# Patient Record
Sex: Male | Born: 1960 | Race: Black or African American | Hispanic: No | Marital: Single | Smoking: Current every day smoker
Health system: Southern US, Community
[De-identification: ages and names within clinical notes are randomized; demographics above are authoritative.]

## PROBLEM LIST (undated history)

## (undated) DIAGNOSIS — Z8739 Personal history of other diseases of the musculoskeletal system and connective tissue: Secondary | ICD-10-CM

## (undated) DIAGNOSIS — K219 Gastro-esophageal reflux disease without esophagitis: Secondary | ICD-10-CM

## (undated) DIAGNOSIS — G8929 Other chronic pain: Secondary | ICD-10-CM

## (undated) DIAGNOSIS — J302 Other seasonal allergic rhinitis: Secondary | ICD-10-CM

## (undated) DIAGNOSIS — M779 Enthesopathy, unspecified: Secondary | ICD-10-CM

## (undated) DIAGNOSIS — M1990 Unspecified osteoarthritis, unspecified site: Secondary | ICD-10-CM

## (undated) DIAGNOSIS — Z8489 Family history of other specified conditions: Secondary | ICD-10-CM

## (undated) HISTORY — PX: CYSTECTOMY: SUR359

## (undated) HISTORY — PX: TONSILLECTOMY: SUR1361

## (undated) HISTORY — PX: FRACTURE SURGERY: SHX138

---

## 2007-07-13 ENCOUNTER — Emergency Department (HOSPITAL_COMMUNITY): Admission: EM | Admit: 2007-07-13 | Discharge: 2007-07-13 | Payer: Self-pay | Admitting: Emergency Medicine

## 2009-10-22 ENCOUNTER — Emergency Department (HOSPITAL_COMMUNITY): Admission: EM | Admit: 2009-10-22 | Discharge: 2009-10-22 | Payer: Self-pay | Admitting: Emergency Medicine

## 2009-12-08 ENCOUNTER — Emergency Department (HOSPITAL_COMMUNITY): Admission: EM | Admit: 2009-12-08 | Discharge: 2009-12-09 | Payer: Self-pay | Admitting: Emergency Medicine

## 2009-12-15 ENCOUNTER — Ambulatory Visit: Payer: Self-pay | Admitting: Vascular Surgery

## 2009-12-15 ENCOUNTER — Encounter (INDEPENDENT_AMBULATORY_CARE_PROVIDER_SITE_OTHER): Payer: Self-pay | Admitting: Emergency Medicine

## 2009-12-15 ENCOUNTER — Observation Stay (HOSPITAL_COMMUNITY): Admission: EM | Admit: 2009-12-15 | Discharge: 2009-12-15 | Payer: Self-pay | Admitting: Emergency Medicine

## 2009-12-26 ENCOUNTER — Emergency Department (HOSPITAL_COMMUNITY): Admission: EM | Admit: 2009-12-26 | Discharge: 2009-12-26 | Payer: Self-pay | Admitting: Emergency Medicine

## 2010-04-29 ENCOUNTER — Ambulatory Visit (HOSPITAL_COMMUNITY): Admission: RE | Admit: 2010-04-29 | Discharge: 2010-04-29 | Payer: Self-pay | Admitting: Orthopaedic Surgery

## 2010-10-31 LAB — COMPREHENSIVE METABOLIC PANEL
ALT: 19 U/L (ref 0–53)
AST: 26 U/L (ref 0–37)
Albumin: 3.9 g/dL (ref 3.5–5.2)
Alkaline Phosphatase: 45 U/L (ref 39–117)
BUN: 16 mg/dL (ref 6–23)
CO2: 26 mEq/L (ref 19–32)
Creatinine, Ser: 1.33 mg/dL (ref 0.4–1.5)
GFR calc non Af Amer: 57 mL/min — ABNORMAL LOW (ref >60)
Potassium: 3.9 mEq/L (ref 3.5–5.1)
Sodium: 138 mEq/L (ref 135–145)
Total Bilirubin: 0.4 mg/dL (ref 0.3–1.2)

## 2010-10-31 LAB — CBC
Hemoglobin: 12.6 g/dL — ABNORMAL LOW (ref 13.0–17.0)
MCV: 102.6 fL — ABNORMAL HIGH (ref 78.0–100.0)
Platelets: 192 10*3/uL (ref 150–400)
WBC: 8.1 10*3/uL (ref 4.0–10.5)

## 2010-10-31 LAB — POCT I-STAT, CHEM 8
BUN: 15 mg/dL (ref 6–23)
Calcium, Ion: 1.07 mmol/L — ABNORMAL LOW (ref 1.12–1.32)
Creatinine, Ser: 1.5 mg/dL (ref 0.4–1.5)
HCT: 38 % — ABNORMAL LOW (ref 39.0–52.0)
Hemoglobin: 12.9 g/dL — ABNORMAL LOW (ref 13.0–17.0)
Potassium: 3.9 mEq/L (ref 3.5–5.1)
Sodium: 140 mEq/L (ref 135–145)
TCO2: 22 mmol/L (ref 0–100)

## 2010-10-31 LAB — APTT: aPTT: 25 seconds (ref 24–37)

## 2010-10-31 LAB — URINE MICROSCOPIC-ADD ON

## 2010-10-31 LAB — TYPE AND SCREEN

## 2010-10-31 LAB — PROTIME-INR: INR: 1.06 (ref 0.00–1.49)

## 2010-12-25 ENCOUNTER — Emergency Department (HOSPITAL_COMMUNITY): Payer: Self-pay

## 2010-12-25 ENCOUNTER — Emergency Department (HOSPITAL_COMMUNITY)
Admission: EM | Admit: 2010-12-25 | Discharge: 2010-12-25 | Disposition: A | Discharge status: Home / Self Care | Payer: Self-pay | Attending: Emergency Medicine | Admitting: Emergency Medicine

## 2010-12-25 DIAGNOSIS — Y9355 Activity, bike riding: Secondary | ICD-10-CM | POA: Insufficient documentation

## 2010-12-25 DIAGNOSIS — M25569 Pain in unspecified knee: Secondary | ICD-10-CM | POA: Insufficient documentation

## 2010-12-25 DIAGNOSIS — S8000XA Contusion of unspecified knee, initial encounter: Secondary | ICD-10-CM | POA: Insufficient documentation

## 2010-12-26 NOTE — Op Note (Signed)
NAMESHAUL, TRAUTMAN                ACCOUNT NO.:  [id number]   MEDICAL RECORD NO.:  [id number]          PATIENT TYPE:  EMS   LOCATION:  ED                           FACILITY:  [facility]   PHYSICIAN:  Johnette Abraham, MD    DATE OF BIRTH:  Feb 28, 1961   DATE OF PROCEDURE:  DATE OF DISCHARGE:                               OPERATIVE REPORT   PREOPERATIVE DIAGNOSIS:  Abscess extensor tenosynovitis of the left long  finger.   POSTOPERATIVE DIAGNOSIS:  Abscess extensor tenosynovitis of the left  long finger.   PROCEDURES:  1. Irrigation and debridement of the deep abscess left long finger.  2. Wound culture.  3. Packing with Iodoform gauze.   SURGEON:  Johnette Abraham, MD   ANESTHESIA:  Local 2% lidocaine without epinephrine, a total of 5 mL  used as a digital hand block.   ESTIMATED BLOOD LOSS:  Minimal.   FINDINGS:  Gross purulence that was cultured from the wound. Involvement  of the extensor tendon.   INDICATIONS:  Mr. Copenhaver is a pleasant gentleman who injured his hand,  so he states, at work with some kind of drill press.  Initially, the  patient noticed a small swelling and pus underneath the skin.  Patient  at this time heated a pin and poked the lesion with some expression of  some purulence.  He states over the course of the last 3 weeks, his  finger has increasingly become red and swollen.  Now he has got swelling  on the dorsum of his hand.  He denies fever.  On examination, there was  fluctuance that extended from the PIP joint distally.  The old I&D site  by the patient was located on the radial side of the distal pulp of the  finger.  There was no tenderness on the proximal flexor tendon sheath.  Patient was counseled on the need for emergent incision and drainage to  avoid any flexor tenosynovitis or worsening of the infection.  He agreed  to proceed.  Consent was obtained.   DESCRIPTION OF PROCEDURE:  Initially, the left finger and adjacent  digits were prepped with  Betadine solution.  A local digital block was  performed in the flexor tendon sheath with 2% lidocaine without  epinephrine.  Additional lidocaine was placed over the dorsum of the  metacarpophalangeal joint area to anesthetize the dorsum of the skin on  the left long finger.  Afterwards, a cruciate type incision was made  overlying the previous I&D site by the patient.  Immediately, there was  gross purulence that was expressed.  This was cultured.  Afterwards, the  incision was lengthened.  There was a pocket that extended onto the  dorsum of the finger from the base of the nail all the way back to the  PIP joint involving most of the dorsum of the finger.  The purulence was  milked throughout the I&D site.  Afterwards, the wound was probed.  There was some deeper loculated infection in the pulp of the finger,  which was expressed.  Afterwards, the entire cavity was irrigated  out  with a syringe and irrigation catheter with normal saline solution.  Next, the wound was gently packed, more so on the pulp and the remaining  I&D site with 0.25 inch Iodoform gauze.  There was concern over the  essentially large blister-type formation on the dorsum of the finger,  which was in intimate proximity to his extensor tendon.  The patient was  cautioned of the possible likelihood of losing the skin over this area,  which may require some other coverage.  After the packing, the wound  was complete.  The wound was covered with sterile gauze and wrapped.  The patient received an IM shot of Ancef and he will be placed on double  strength Bactrim to guard against MRSA.  The patient is instructed on  wound care and he will call the office for a followup appointment.      Johnette Abraham, MD  Electronically Signed     HCC/MEDQ  D:  07/13/2007  T:  07/13/2007  Job:  [id number]

## 2011-02-27 ENCOUNTER — Emergency Department (HOSPITAL_COMMUNITY)
Admission: EM | Admit: 2011-02-27 | Discharge: 2011-02-27 | Discharge status: Against Medical Advice | Payer: Self-pay | Attending: Emergency Medicine | Admitting: Emergency Medicine

## 2011-05-22 LAB — WOUND CULTURE: Gram Stain: NONE SEEN

## 2012-10-08 ENCOUNTER — Encounter (HOSPITAL_COMMUNITY): Payer: Self-pay | Admitting: *Deleted

## 2012-10-08 ENCOUNTER — Emergency Department (HOSPITAL_COMMUNITY)
Admission: EM | Admit: 2012-10-08 | Discharge: 2012-10-08 | Disposition: A | Discharge status: Home / Self Care | Payer: Self-pay | Attending: Emergency Medicine | Admitting: Emergency Medicine

## 2012-10-08 DIAGNOSIS — G8929 Other chronic pain: Secondary | ICD-10-CM | POA: Insufficient documentation

## 2012-10-08 DIAGNOSIS — F17200 Nicotine dependence, unspecified, uncomplicated: Secondary | ICD-10-CM | POA: Insufficient documentation

## 2012-10-08 DIAGNOSIS — M25569 Pain in unspecified knee: Secondary | ICD-10-CM | POA: Insufficient documentation

## 2012-10-08 DIAGNOSIS — R269 Unspecified abnormalities of gait and mobility: Secondary | ICD-10-CM | POA: Insufficient documentation

## 2012-10-08 DIAGNOSIS — Z8739 Personal history of other diseases of the musculoskeletal system and connective tissue: Secondary | ICD-10-CM | POA: Insufficient documentation

## 2012-10-08 DIAGNOSIS — M25519 Pain in unspecified shoulder: Secondary | ICD-10-CM | POA: Insufficient documentation

## 2012-10-08 HISTORY — DX: Enthesopathy, unspecified: M77.9

## 2012-10-08 HISTORY — DX: Other chronic pain: G89.29

## 2012-10-08 MED ORDER — TRAMADOL HCL 50 MG PO TABS: 50.0000 mg | ORAL_TABLET | Freq: Four times a day (QID) | ORAL | Status: DC | PRN

## 2012-10-08 MED ORDER — OXYCODONE-ACETAMINOPHEN 5-325 MG PO TABS
2.0000 | ORAL_TABLET | Freq: Once | ORAL | Status: AC
  Administered 2012-10-08: 2 via ORAL
  Filled 2012-10-08: qty 2

## 2012-10-08 NOTE — ED Notes (Addendum)
Pt with pain to entire L side x 1 year.  However, has been increasing, thus he is here today.  States was dx with tendonitis while in prison.  Pt would also like to be tested for dm and other maladies.  Notified of his need for pcp.

## 2012-10-08 NOTE — ED Notes (Signed)
Ortho paged for knee immobilizer

## 2012-10-08 NOTE — ED Provider Notes (Signed)
History     CSN: [id number]  Arrival date & time 10/08/12  1147   First MD Initiated Contact with Patient 10/08/12 1242      No chief complaint on file.   (Consider location/radiation/quality/duration/timing/severity/associated sxs/prior treatment) HPI Comments: Pt has tendonitis in bilateral knees and elbows. Says the pain has moved up to both shoulders, more left than right. Pt states pain is 9/10. Sharp pain that is persistent. Left shoulder pain has been worsening over several months. Pt has tried stretching the muscles and it helps, but then the pain returns. Not better or worse with movement. Denies trauma, fever, chills.   Pt also complaining of chronic left knee pain and asked for knee immobilzer for comfort.  Pt takes over the counter ibuprofen for pain. Pt had muscle relaxers prescribed by Dr. Madilyn Fireman, but doesn't see that doctor anymore.   Patient is a 52 y.o. male presenting with left shoulder and left knee pain Severity: Moderate Onset quality: Gradual  Duration: months Timing: Constant  Progression: worsening Relieved by: ibuprofen, muscle relaxers Worsened by: Nothing tried  Ineffective treatments: None tried       Past Medical History  Diagnosis Date  . Tendonitis   . Chronic pain     History reviewed. No pertinent past surgical history.  No family history on file.  History  Substance Use Topics  . Smoking status: Current Every Day Smoker -- 0.25 packs/day    Types: Cigarettes  . Smokeless tobacco: Not on file  . Alcohol Use: Yes     Comment: 1 pint of liquor per week      Review of Systems  Constitutional: Negative for fever and diaphoresis.  HENT: Negative for neck pain and neck stiffness.   Eyes: Negative for visual disturbance.  Respiratory: Negative for apnea, chest tightness and shortness of breath.   Cardiovascular: Negative for chest pain and palpitations.  Gastrointestinal: Negative for nausea, vomiting, diarrhea and constipation.   Genitourinary: Negative for dysuria.  Musculoskeletal: Positive for gait problem.       Sharp pain in left shoulder. Left knee pain.  Skin: Negative for rash.  Neurological: Negative for dizziness, weakness, light-headedness, numbness and headaches.    Allergies  Review of patient's allergies indicates no known allergies.  Home Medications   Current Outpatient Rx  Name  Route  Sig  Dispense  Refill  . acetaminophen (TYLENOL) 500 MG tablet   Oral   Take 1,500 mg by mouth every 6 (six) hours as needed for pain.         . Naproxen Sodium (ALEVE PO)   Oral   Take 3 tablets by mouth daily as needed (for pain.).           BP 121/79  Pulse 55  Temp(Src) 97.9 F (36.6 C)  Resp 18  SpO2 98%  Physical Exam  Nursing note and vitals reviewed. Constitutional: He is oriented to person, place, and time. He appears well-developed and well-nourished. No distress.  HENT:  Head: Normocephalic and atraumatic.  Eyes: Conjunctivae and EOM are normal.  Neck: Normal range of motion. Neck supple.  No meningeal signs  Cardiovascular: Normal rate, regular rhythm, normal heart sounds and intact distal pulses.  Exam reveals no gallop and no friction rub.   No murmur heard. Pulmonary/Chest: Effort normal and breath sounds normal. No respiratory distress. He has no wheezes. He has no rales. He exhibits no tenderness.  Abdominal: Soft. Bowel sounds are normal. He exhibits no distension. There is no tenderness. There  is no rebound and no guarding.  Musculoskeletal: Normal range of motion. He exhibits no edema and no tenderness.  5/5 strength throughout. No swelling. No erythema. No warmth. No effusion. No deformity. No bony tenderness. Good quadricep strength on straight leg raise. No joint laxity.  Neurological: He is alert and oriented to person, place, and time. No cranial nerve deficit.  No focal deficits. Sensation to light touch intact.  Skin: Skin is warm and dry. He is not diaphoretic. No  erythema.    ED Course  Procedures (including critical care time)  Labs Reviewed - No data to display No results found.   Diagnosis: left shoulder pain, left knee pain    MDM  Considering HPI and PE, including lack of trauma, hx of chronic conditions, interventions that have worked in the past, no swelling, no erythema, no warmth, no effusion, no deformity, no bony tenderness, no fever or impaired ROM, not concerned for acute bony injury or septic arthritis.  Emphasized the importance of following up with a PCP for pt's chronic medical issues, including pain management, and use of ibuprofen to calm chronic inflammatory conditions. Provided resource list.  Treated pain in ED with percocet and provided prescription for Ultram for breakthrough pain with ibuprofen. Provided knee immobilizer as requested by patient for comfort. At this time there does not appear to be any evidence of an acute emergency medical condition and the patient appears stable for discharge with appropriate outpatient follow up.Diagnosis was discussed with patient who verbalizes understanding and is agreeable to discharge.   Glade Nurse, PA-C 10/09/12 1324

## 2012-10-08 NOTE — ED Notes (Addendum)
Pain to left ankle, knee, thigh for years due to tendonitis. Also having heart burn, with no relief from tums. Also dx with pinched nerve in back, which has been giving him more problems.

## 2012-10-09 ENCOUNTER — Emergency Department (HOSPITAL_COMMUNITY): Payer: Self-pay

## 2012-10-09 ENCOUNTER — Emergency Department (HOSPITAL_COMMUNITY)
Admission: EM | Admit: 2012-10-09 | Discharge: 2012-10-09 | Disposition: A | Discharge status: Home / Self Care | Payer: Self-pay | Attending: Emergency Medicine | Admitting: Emergency Medicine

## 2012-10-09 ENCOUNTER — Encounter (HOSPITAL_COMMUNITY): Payer: Self-pay | Admitting: Neurology

## 2012-10-09 DIAGNOSIS — Z791 Long term (current) use of non-steroidal anti-inflammatories (NSAID): Secondary | ICD-10-CM | POA: Insufficient documentation

## 2012-10-09 DIAGNOSIS — IMO0001 Reserved for inherently not codable concepts without codable children: Secondary | ICD-10-CM | POA: Insufficient documentation

## 2012-10-09 DIAGNOSIS — Z79899 Other long term (current) drug therapy: Secondary | ICD-10-CM | POA: Insufficient documentation

## 2012-10-09 DIAGNOSIS — M1990 Unspecified osteoarthritis, unspecified site: Secondary | ICD-10-CM | POA: Insufficient documentation

## 2012-10-09 DIAGNOSIS — M7918 Myalgia, other site: Secondary | ICD-10-CM

## 2012-10-09 DIAGNOSIS — G8929 Other chronic pain: Secondary | ICD-10-CM | POA: Insufficient documentation

## 2012-10-09 DIAGNOSIS — M25569 Pain in unspecified knee: Secondary | ICD-10-CM | POA: Insufficient documentation

## 2012-10-09 DIAGNOSIS — M25559 Pain in unspecified hip: Secondary | ICD-10-CM | POA: Insufficient documentation

## 2012-10-09 DIAGNOSIS — F17200 Nicotine dependence, unspecified, uncomplicated: Secondary | ICD-10-CM | POA: Insufficient documentation

## 2012-10-09 DIAGNOSIS — Z8739 Personal history of other diseases of the musculoskeletal system and connective tissue: Secondary | ICD-10-CM | POA: Insufficient documentation

## 2012-10-09 LAB — GLUCOSE, CAPILLARY

## 2012-10-09 MED ORDER — NAPROXEN 250 MG PO TABS
500.0000 mg | ORAL_TABLET | Freq: Once | ORAL | Status: AC
  Administered 2012-10-09: 500 mg via ORAL
  Filled 2012-10-09: qty 2

## 2012-10-09 MED ORDER — HYDROCODONE-ACETAMINOPHEN 5-325 MG PO TABS
2.0000 | ORAL_TABLET | Freq: Once | ORAL | Status: AC
  Administered 2012-10-09: 2 via ORAL
  Filled 2012-10-09: qty 2

## 2012-10-09 MED ORDER — HYDROCODONE-ACETAMINOPHEN 5-325 MG PO TABS: 2.0000 | ORAL_TABLET | ORAL | Status: DC | PRN

## 2012-10-09 NOTE — ED Provider Notes (Signed)
History     CSN: [id number]  Arrival date & time 10/09/12  0957   First MD Initiated Contact with Patient 10/09/12 [phone]      Chief Complaint  Patient presents with  . left side pain   . right arm pain   . right hip pain     (Consider location/radiation/quality/duration/timing/severity/associated sxs/prior treatment) HPI Comments: 52 yo male cc pain in left arm and left leg. Present for years after [facility]. No new injury. Seen yesterday given Tramadol but gave him headache, requesting something else for pain. No real neck or back pain. No motor weakness or numbness. Patient is ambulatory. He was given bilat knee immob yest for comfort. Has no PCP. Pain is mild/moderate located to left shoulder radiates to forearm, pain in left knee radiates up thigh. There has been no joint swelling. There is no limited range of motion. There have been no fevers or chills. No other significant PMH or PSH. Patient smokes cigarettes and drinks alcohol on occasions.  The history is provided by the patient. No language interpreter was used.    Past Medical History  Diagnosis Date  . Tendonitis   . Chronic pain     History reviewed. No pertinent past surgical history.  No family history on file.  History  Substance Use Topics  . Smoking status: Current Every Day Smoker -- 0.25 packs/day    Types: Cigarettes  . Smokeless tobacco: Not on file  . Alcohol Use: Yes     Comment: 1 pint of liquor per week      Review of Systems  Constitutional: Negative for fever and chills.  HENT: Negative for sore throat and neck pain.   Eyes: Negative for visual disturbance.  Respiratory: Negative for cough and shortness of breath.   Cardiovascular: Negative for chest pain.  Gastrointestinal: Negative for nausea, vomiting, abdominal pain and diarrhea.  Genitourinary: Negative for dysuria and frequency.  Musculoskeletal: Positive for myalgias. Negative for back pain.  Skin: Negative for rash.  Neurological:  Negative for weakness, numbness and headaches.  Hematological: Negative for adenopathy.  Psychiatric/Behavioral: Negative for behavioral problems.    Allergies  Review of patient's allergies indicates no active allergies.  Home Medications   Current Outpatient Rx  Name  Route  Sig  Dispense  Refill  . acetaminophen (TYLENOL) 500 MG tablet   Oral   Take 1,500 mg by mouth every 6 (six) hours as needed for pain.         . Naproxen Sodium (ALEVE PO)   Oral   Take 3 tablets by mouth daily as needed (for pain.).         Marland Kitchen traMADol (ULTRAM) 50 MG tablet   Oral   Take 50 mg by mouth every 6 (six) hours as needed for pain.         Marland Kitchen HYDROcodone-acetaminophen (NORCO/VICODIN) 5-325 MG per tablet   Oral   Take 2 tablets by mouth every 4 (four) hours as needed for pain.   10 tablet   0     BP 121/88  Pulse 59  Temp(Src) 97.8 F (36.6 C) (Oral)  Resp 16  SpO2 93%  Physical Exam  Nursing note and vitals reviewed. Constitutional: He appears well-developed and well-nourished. No distress.  Well appearing. Patient with pulse 50, states pulse is always low.  HENT:  Head: Normocephalic and atraumatic.  Mouth/Throat: Oropharynx is clear and moist. No oropharyngeal exudate.  Eyes: Conjunctivae and EOM are normal. Pupils are equal, round, and reactive to  light. Right eye exhibits no discharge. Left eye exhibits no discharge. No scleral icterus.  Neck: Normal range of motion. Neck supple. No JVD present. No thyromegaly present.  Cardiovascular: Normal rate, regular rhythm, normal heart sounds and intact distal pulses.  Exam reveals no gallop and no friction rub.   No murmur heard. Pulmonary/Chest: Effort normal and breath sounds normal. No respiratory distress. He has no wheezes. He has no rales.  Abdominal: Soft. Bowel sounds are normal. He exhibits no distension and no mass. There is no tenderness.  Musculoskeletal: Normal range of motion. He exhibits no edema and no tenderness.   No tenderness to spine. No point tenderness along upper or lower extremities. Normal ROM at shoulder, elbow, and wrist on left side. Normal ROM at hip and knee on left side. Motor 5/5 4 extremities. Patient is ambulatory. Pulses 2+, well perfused. Can extend knee. No laxity noted.   Lymphadenopathy:    He has no cervical adenopathy.  Neurological: He is alert. Coordination normal.  Skin: Skin is warm and dry. No rash noted. No erythema.  Psychiatric: He has a normal mood and affect. His behavior is normal.    ED Course  Procedures (including critical care time)  Labs Reviewed - No data to display Dg Shoulder Left  10/09/2012  *RADIOLOGY REPORT*  Clinical Data: Left shoulder pain.  LEFT SHOULDER - 2+ VIEW  Comparison: None  Findings: The joint spaces are maintained.  No acute bony findings. Enthesopathic changes noted along the undersurface of the clavicle likely due to a tug type process from the coracoclavicular ligaments. The left lung is clear.  IMPRESSION: No acute bony findings.   Original Report Authenticated By: Rudie Meyer, M.D.      1. Musculoskeletal pain       MDM  52 yo male with chronic pain. DDx: osteoarthritis, chronic pain. Do not suspect collagen vascular disease or crystal arthropathy. Get XRAY left shoulder. Treat with limited opioids and NSAIDs with referral to PCP for pain management.   I have personally interpretted the xray series of the L shoulder and find no fractures, significant arthritis or dislocation or tumors that would be causing patients chronic pain. Home with Nsaids, hydrocodone and chronic pain f/u.     Vida Roller, MD 10/09/12 [phone]

## 2012-10-09 NOTE — ED Notes (Signed)
Patient transported to X-ray 

## 2012-10-09 NOTE — ED Notes (Signed)
Pt reporting left sided pain from knee up to arm x 2 years. Has knee immobilizer in place. Also right arm and right hip pain x 2 years. Pt is alert and oriented . NAD

## 2012-10-09 NOTE — ED Provider Notes (Signed)
Medical screening examination/treatment/procedure(s) were performed by non-physician practitioner and as supervising physician I was immediately available for consultation/collaboration.   Flint Melter, MD 10/09/12 [phone]

## 2013-11-08 ENCOUNTER — Encounter (HOSPITAL_COMMUNITY): Payer: Self-pay | Admitting: Emergency Medicine

## 2013-11-08 ENCOUNTER — Emergency Department (HOSPITAL_COMMUNITY)
Admission: EM | Admit: 2013-11-08 | Discharge: 2013-11-08 | Disposition: A | Discharge status: Home / Self Care | Payer: No Typology Code available for payment source | Attending: Emergency Medicine | Admitting: Emergency Medicine

## 2013-11-08 DIAGNOSIS — Z79899 Other long term (current) drug therapy: Secondary | ICD-10-CM | POA: Insufficient documentation

## 2013-11-08 DIAGNOSIS — F17200 Nicotine dependence, unspecified, uncomplicated: Secondary | ICD-10-CM | POA: Insufficient documentation

## 2013-11-08 DIAGNOSIS — M1990 Unspecified osteoarthritis, unspecified site: Secondary | ICD-10-CM

## 2013-11-08 DIAGNOSIS — M159 Polyosteoarthritis, unspecified: Secondary | ICD-10-CM | POA: Insufficient documentation

## 2013-11-08 DIAGNOSIS — M779 Enthesopathy, unspecified: Secondary | ICD-10-CM | POA: Insufficient documentation

## 2013-11-08 NOTE — ED Provider Notes (Signed)
Medical screening examination/treatment/procedure(s) were performed by non-physician practitioner and as supervising physician I was immediately available for consultation/collaboration.     Shyloh Derosa, MD 11/08/13 2329 

## 2013-11-08 NOTE — ED Provider Notes (Signed)
CSN: [id number]     Arrival date & time 11/08/13  1402 History  This chart was scribed for non-physician practitioner, Fayrene Helper, PA-C working with Geoffery Lyons, MD by Greggory Stallion, ED scribe. This patient was seen in room TR06C/TR06C and the patient's care was started at 3:26 PM.   Chief Complaint  Patient presents with  . Knee Pain  . Hip Pain   The history is provided by the patient. No language interpreter was used.   HPI Comments: Cody [name] is a 53 y.o. male who presents to the Emergency Department complaining of chronic bilateral knee and hip pain. He also has chronic right elbow and shoulder pain due to tendonitis. Denies any new injuries. The pain is worse in the morning. Pt has taken aleve with little relief. He is here today requesting PCP referrals. Denies fever, chills, rash, leg swelling, joint swelling.   Past Medical History  Diagnosis Date  . Tendonitis   . Chronic pain    History reviewed. No pertinent past surgical history. History reviewed. No pertinent family history. History  Substance Use Topics  . Smoking status: Current Every Day Smoker -- 0.25 packs/day    Types: Cigarettes  . Smokeless tobacco: Not on file  . Alcohol Use: Yes     Comment: 1 pint of liquor per week    Review of Systems  Constitutional: Negative for fever and chills.  Cardiovascular: Negative for leg swelling.  Musculoskeletal: Positive for arthralgias. Negative for joint swelling.  Skin: Negative for rash.  All other systems reviewed and are negative.   Allergies  Review of patient's allergies indicates no known allergies.  Home Medications   Current Outpatient Rx  Name  Route  Sig  Dispense  Refill  . acetaminophen (TYLENOL) 500 MG tablet   Oral   Take 1,500 mg by mouth every 6 (six) hours as needed for pain.         Marland Kitchen HYDROcodone-acetaminophen (NORCO/VICODIN) 5-325 MG per tablet   Oral   Take 2 tablets by mouth every 4 (four) hours as needed for pain.   10  tablet   0   . Naproxen Sodium (ALEVE PO)   Oral   Take 3 tablets by mouth daily as needed (for pain.).         Marland Kitchen traMADol (ULTRAM) 50 MG tablet   Oral   Take 50 mg by mouth every 6 (six) hours as needed for pain.          BP 127/74  Pulse 55  Temp(Src) 98.3 F (36.8 C) (Oral)  Resp 20  Wt 175 lb (79.379 kg)  SpO2 99%  Physical Exam  Nursing note and vitals reviewed. Constitutional: He is oriented to person, place, and time. He appears well-developed and well-nourished. No distress.  HENT:  Head: Normocephalic and atraumatic.  Eyes: EOM are normal.  Neck: Neck supple. No tracheal deviation present.  Cardiovascular: Normal rate.   Pulmonary/Chest: Effort normal. No respiratory distress.  Musculoskeletal: Normal range of motion.  Mild tenderness to right shoulder, elbow, right hip and bilateral knees. Full ROM. No rash or swelling. No spinous process tenderness.   Neurological: He is alert and oriented to person, place, and time.  Normal gait.  Skin: Skin is warm and dry.  Psychiatric: He has a normal mood and affect. His behavior is normal.    ED Course  Procedures (including critical care time)  DIAGNOSTIC STUDIES: Oxygen Saturation is 99% on RA, normal by my interpretation.  COORDINATION OF CARE: 3:30 PM-Discussed treatment plan which includes PCP resource guide and continuing ibuprofen use with pt at bedside and pt agreed to plan. Doubt polyarthralgia from infective etiology.  Able to ambulate without difficulty.  Labs Review Labs Reviewed - No data to display Imaging Review No results found.   EKG Interpretation None      MDM   Final diagnoses:  Osteoarthritis    BP 127/74  Pulse 55  Temp(Src) 98.3 F (36.8 C) (Oral)  Resp 20  Wt 175 lb (79.379 kg)  SpO2 99%   I personally performed the services described in this documentation, which was scribed in my presence. The recorded information has been reviewed and is accurate.    Fayrene HelperBowie  Kristan Votta, PA-C 11/08/13 [phone]

## 2013-11-08 NOTE — ED Notes (Signed)
Reports chronic pain to bilateral knees and right hip. Reports having tendonitis to bilateral elbows and arms. Pain more severe when he gets up in the morning and denies any new injuries. Wants to referred to pcp.

## 2013-11-08 NOTE — Discharge Instructions (Signed)
Please use resources below to find a doctor close to you for further management of your health.    Osteoarthritis Osteoarthritis is a disease that causes soreness and swelling (inflammation) of a joint. It occurs when the cartilage at the affected joint wears down. Cartilage acts as a cushion, covering the ends of bones where they meet to form a joint. Osteoarthritis is the most common form of arthritis. It often occurs in older people. The joints affected most often by this condition include those in the:  Ends of the fingers.  Thumbs.  Neck.  Lower back.  Knees.  Hips. CAUSES  Over time, the cartilage that covers the ends of bones begins to wear away. This causes bone to rub on bone, producing pain and stiffness in the affected joints.  RISK FACTORS Certain factors can increase your chances of having osteoarthritis, including:  Older age.  Excessive body weight.  Overuse of joints. SIGNS AND SYMPTOMS   Pain, swelling, and stiffness in the joint.  Over time, the joint may lose its normal shape.  Small deposits of bone (osteophytes) may grow on the edges of the joint.  Bits of bone or cartilage can break off and float inside the joint space. This may cause more pain and damage. DIAGNOSIS  Your health care provider will do a physical exam and ask about your symptoms. Various tests may be ordered, such as:  X-rays of the affected joint.  An MRI scan.  Blood tests to rule out other types of arthritis.  Joint fluid tests. This involves using a needle to draw fluid from the joint and examining the fluid under a microscope. TREATMENT  Goals of treatment are to control pain and improve joint function. Treatment plans may include:  A prescribed exercise program that allows for rest and joint relief.  A weight control plan.  Pain relief techniques, such as:  Properly applied heat and cold.  Electric pulses delivered to nerve endings under the skin (transcutaneous  electrical nerve stimulation, TENS).  Massage.  Certain nutritional supplements.  Medicines to control pain, such as:  Acetaminophen.  Nonsteroidal anti-inflammatory drugs (NSAIDs), such as naproxen.  Narcotic or central-acting agents, such as tramadol.  Corticosteroids. These can be given orally or as an injection.  Surgery to reposition the bones and relieve pain (osteotomy) or to remove loose pieces of bone and cartilage. Joint replacement may be needed in advanced states of osteoarthritis. HOME CARE INSTRUCTIONS   Only take over-the-counter or prescription medicines as directed by your health care provider. Take all medicines exactly as instructed.  Maintain a healthy weight. Follow your health care provider's instructions for weight control. This may include dietary instructions.  Exercise as directed. Your health care provider can recommend specific types of exercise. These may include:  Strengthening exercises These are done to strengthen the muscles that support joints affected by arthritis. They can be performed with weights or with exercise bands to add resistance.  Aerobic activities These are exercises, such as brisk walking or low-impact aerobics, that get your heart pumping.  Range-of-motion activities These keep your joints limber.  Balance and agility exercises These help you maintain daily living skills.  Rest your affected joints as directed by your health care provider.  Follow up with your health care provider as directed. SEEK MEDICAL CARE IF:   Your skin turns red.  You develop a rash in addition to your joint pain.  You have worsening joint pain. SEEK IMMEDIATE MEDICAL CARE IF:  You have a  significant loss of weight or appetite.  You have a fever along with joint or muscle aches.  You have night sweats. FOR MORE INFORMATION  National Institute of Arthritis and Musculoskeletal and Skin Diseases: www.niams.http://www.myers.net/nih.gov [employer] on Aging:  https://walker.com/www.nia.nih.gov American College of Rheumatology: www.rheumatology.org Document Released: 07/30/2005 Document Revised: 05/20/2013 Document Reviewed: 04/06/2013 [facility] Patient Information 2014 [employer] [place].   Emergency Department Resource Guide 1) Find a Doctor and Pay Out of Pocket Although you won't have to find out who is covered by your insurance plan, it is a good idea to ask around and get recommendations. You will then need to call the office and see if the doctor you have chosen will accept you as a new patient and what types of options they offer for patients who are self-pay. Some doctors offer discounts or will set up payment plans for their patients who do not have insurance, but you will need to ask so you aren't surprised when you get to your appointment.  2) Contact Your Local Health Department Not all health departments have doctors that can see patients for sick visits, but many do, so it is worth a call to see if yours does. If you don't know where your local health department is, you can check in your phone book. The CDC also has a tool to help you locate your state's health department, and many state websites also have listings of all of their local health departments.  3) Find a Walk-in Clinic If your illness is not likely to be very severe or complicated, you may want to try a walk in clinic. These are popping up all over the country in pharmacies, drugstores, and shopping centers. They're usually staffed by nurse practitioners or physician assistants that have been trained to treat common illnesses and complaints. They're usually fairly quick and inexpensive. However, if you have serious medical issues or chronic medical problems, these are probably not your best option.  No Primary Care Doctor: - Call Health Connect at  [phone] - they can help you locate a primary care doctor that  accepts your insurance, provides certain services, etc. - Physician Referral Service-  [phone]  Chronic Pain Problems: Organization         Address  Phone   Notes  [facility]  [phone] Patients need to be referred by their primary care doctor.   Medication Assistance: Organization         Address  Phone   Notes  [facility] [address] [place], [place] [address] [phone] --Must be a resident of [facility] [place] -- Must have NO insurance coverage whatsoever (no Medicaid/ Medicare, etc.) -- The pt. MUST have a primary care doctor that directs their care regularly and follows them in the community   Med[facility]ist  [phone]   [employer]  [phone]    Agencies that provide inexpensive medical care: Organization         Address  Phone   Notes  [facility]  [phone]   [facility]    [phone]   [facility] [address] [place], [place] [address] [phone]   [facility] [address], [place] [phone]   [facility]    [phone]) [phone]   [facility]    [phone]   [facility]  [address], [place] Phone:  (  [phone], Fax:  [phone] Hours of Operation:  9 am - 6 pm, M-F.  Also accepts Medicaid/Medicare and self-pay.  [facility]  [address], [place] Phone: [phone], Fax: [phone]. Hours of Operation:  8:30 am - 5:30 pm, M-F.  Also accepts Medicaid and self-pay.  [facility] [place] [address], [place] Phone: [phone]   [facility] [address] Natasha Bence [place], [place] [phone], Ext. [phone] Mondays & Thursdays: 7-9 AM.  First 15 patients are seen on a first come, first serve basis.    Medicaid-accepting [facility] Providers:  Organization         Address  Phone   Notes  [facility] [address],  [place] [phone] Also accepts self-pay patients.  [facility] [address] Laurell Josephs [place], [place]  [phone]   [facility] [address], [place] [phone]   [facility] [address], [place] [phone]   Renaye Rakers [address], [place]   [phone] Only accepts [place] Access [place] patients after they have their name applied to their card.   Self-Pay (no insurance) in [facility]:  Organization         Address  Phone   Notes  Sickle Cell Patients, [facility] [address] [place], [place] [phone]   [facility] [address] [place], [place] [phone]   [facility] [place]  [address], [place] [phone]   [facility]/Dr. Osei-Bonsu  [address], [place] or [address], [place] [phone] Phone number for both [place] and [place] locations is the same.  [facility] [address], [place] [phone]   [facility] [address], [place] or [address] [phone]   [facility] [address], [place] [phone], phone; [phone], fax Sees patients 1st and 3rd Saturday of every month.  Must not qualify for public or private insurance (i.e. Medicaid, Medicare, [place] Health Choice, Veterans' Benefits)  Household income should be no more than 200% of the poverty level The clinic cannot treat you if you are pregnant or think you are pregnant  Sexually transmitted diseases are not treated at the clinic.    Dental Care: Organization         Address  Phone  Notes  [facility] Department of [facility] [address] [place], [place] [phone] Accepts children up to age 60 who are enrolled in  [place] or [place] Health Choice; pregnant women with a Medicaid card; and children who have applied for Medicaid or [place] Health Choice, but were declined, whose parents can pay a reduced fee at time of service.  [facility] Department of [facility]  [address], [place] [phone] Accepts children up to age 27 who are enrolled in [place] or [place] Health Choice; pregnant women with a Medicaid card; and children who have applied for Medicaid or [place] Health Choice, but were declined, whose parents can pay a reduced fee at time of service.  [facility]  [address] [place], [place] [phone] Patients are seen by appointment only. Walk-ins  are not accepted. [facility] will see patients 25 years of age and older. Monday - Tuesday (8am-5pm) Most Wednesdays (8:30-5pm) $30 per visit, cash only  [facility]  [address], [facility] [phone] Patients are seen by appointment only. Walk-ins are not accepted. [facility] will see patients 82 years of age and older. One Wednesday Evening (Monthly: Volunteer Based).  $30 per visit, cash only  [employer]  [phone] for adults; Children under age 59, call Graduate Pediatric Dentistry at [phone]. Children aged 50-14, please call [phone] to request a pediatric application.  Dental services are provided in all areas of dental care including fillings, crowns and bridges, complete and partial dentures, implants, gum treatment, root canals, and extractions. Preventive care is also provided. Treatment is provided to both adults and children. Patients are selected via a lottery and there is often a waiting list.   [facility] [address] [place]  [phone] [email or web address]   [facility] [address] [place], [place] [phone], Ext. [phone] Second and Fourth Thursday of each month, opens at 6:30  AM; Clinic ends at 9 AM.  Patients are seen on a first-come first-served basis, and a limited number are seen during each clinic.   [facility]  [address] Ether Griffins [place], [place] [phone]   Eligibility Requirements You must have lived in [place], [place], or [place] counties for at least the last three months.   You cannot be eligible for state or federal sponsored [place], including [employer], [place], or [employer].   You generally cannot be eligible for healthcare insurance through your employer.    How to apply: Eligibility screenings are held every Tuesday and Wednesday afternoon from 1:00 pm until 4:00 pm. You do not need an appointment for the interview!  [facility] [address] [place], [place] [phone]   [facility] Health Department  [phone]   [facility] Health Department  [phone]   [facility] Health Department  [phone]    Behavioral Health Resources in the Community: Intensive Outpatient Programs Organization         Address  Phone  Notes  [facility] Services [address], [place], [place] [phone]   [facility] Outpatient [address], [place], [place] [phone]   [facility] [address], [place], [place]  [phone]   [facility] Mental Health [address],  [place], [place] [phone] or [phone]   Substance Abuse Resources Organization         Address  Phone  Notes  [facility] Drug Services  [phone]   [facility]  [phone]   [facility]  [phone]   Floydene Flock  [phone]   Residential & Outpatient Substance Abuse Program  [phone]   Psychological Services Organization         Address  Phone  Notes  [facility] Behavioral Health  [phone]   [facility] Services  [phone]   [facility] [address], [place] [phone] or  [phone]    Mobile Crisis Teams Organization         Address  Phone  Notes  [facility], Mobile Crisis Care Unit  [phone]   [facility]  [address] [place], [place] [phone]   [facility] [address] [place] [phone]  Self-Help/Support Groups Organization         Address  Phone             Notes  [facility] of [place] - variety of support groups  [phone]- I7437963 Call for more information  Narcotics Anonymous (NA), [facility] [address], [employer] [place]  2 meetings at this location   Statistician         Address  Phone  Notes  [facility] [address],    [place]  [phone]   [facility]  [address], [place] [address], [place], [place] [phone]   [facility] Treatment [facility] [address] [place], [place] [phone] Admissions: 8am-3pm M-F  [facility] [address]    [place], [place] [phone]   [facility] [address] [place], [place], [place] [phone]   [facility] [address],  [place], [place] [phone]   [facility] - Intensive Outpatient [address] Laurell Joseph[address], [place], [place] [phone]   [facility] [address]  [place], [place] [phone] or [phone]   [facility] ([facility]) [address], [place], [place] [phone] Accepts Medicaid  [facility] [address],  [place] [phone] Substance Abuse/Addiction Treatment   [facility] Organization         Address  Phone  Notes  [facility]  [phone]   Angie Fava, PhD [address] Ervin Knack [place], [place]   [phone] or [phone]   [facility] Behavioral   [address] [place], [place] [phone]   [facility] [address],  [place], [place] [phone] Insurance/Medicaid/sponsorship through [facility] and Families [address]                                    [place], [place] [phone] Therapy/tele-psych/case  [facility] [address], [place] [phone]    Dr. Lolly Mustache  [phone]   [facility]  [employer] [facility] Dept. 1) [address], [place] 2) [address] [place] 3)  [address], [place] [phone]  [phone]   [facility] Child Abuse Hotline 623-220-3108 or [phone] (After Hours)

## 2014-08-24 ENCOUNTER — Emergency Department (HOSPITAL_COMMUNITY)
Admission: EM | Admit: 2014-08-24 | Discharge: 2014-08-24 | Disposition: A | Discharge status: Home / Self Care | Payer: No Typology Code available for payment source | Attending: Emergency Medicine | Admitting: Emergency Medicine

## 2014-08-24 ENCOUNTER — Encounter (HOSPITAL_COMMUNITY): Payer: Self-pay

## 2014-08-24 DIAGNOSIS — Z79899 Other long term (current) drug therapy: Secondary | ICD-10-CM | POA: Insufficient documentation

## 2014-08-24 DIAGNOSIS — M25562 Pain in left knee: Secondary | ICD-10-CM | POA: Insufficient documentation

## 2014-08-24 DIAGNOSIS — M1990 Unspecified osteoarthritis, unspecified site: Secondary | ICD-10-CM | POA: Insufficient documentation

## 2014-08-24 DIAGNOSIS — Z720 Tobacco use: Secondary | ICD-10-CM | POA: Insufficient documentation

## 2014-08-24 DIAGNOSIS — M25551 Pain in right hip: Secondary | ICD-10-CM | POA: Insufficient documentation

## 2014-08-24 DIAGNOSIS — M25561 Pain in right knee: Secondary | ICD-10-CM | POA: Insufficient documentation

## 2014-08-24 DIAGNOSIS — G8929 Other chronic pain: Secondary | ICD-10-CM | POA: Insufficient documentation
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
## 2014-08-24 NOTE — ED Notes (Signed)
Pt reports taking aleve with other pain meds with no relief.

## 2014-08-24 NOTE — Discharge Instructions (Signed)
Please call your doctor for a followup appointment within 24-48 hours. When you talk to your doctor please let them know that you were seen in the emergency department and have them acquire all of your records so that they can discuss the findings with you and formulate a treatment plan to fully care for your new and ongoing problems. Please follow up with health and wellness Center Please follow-up with orthopedics Please rest and stay hydrated Please avoid any physical strenuous activity Please massage with icy hot ointment Please perform warm soaks for pain relief Please continue to monitor symptoms closely and if symptoms are to worsen or change (fever greater than 101, chills, sweating, nausea, vomiting, chest pain, shortness of breathe, difficulty breathing, weakness, numbness, tingling, worsening or changes to pain pattern, fall, injury, loss of sensation, inability to control urine or bowel movements) please report back to the Emergency Department immediately.   Chronic Pain Discharge Instructions  Emergency care providers appreciate that many patients coming to [place] are in severe pain and we wish to address their pain in the safest, most responsible manner.  It is important to recognize however, that the proper treatment of chronic pain differs from that of the pain of injuries and acute illnesses.  Our goal is to provide quality, safe, personalized care and we thank you for giving [place] the opportunity to serve you. The use of narcotics and related agents for chronic pain syndromes may lead to additional physical and psychological problems.  Nearly as many people die from prescription narcotics each year as die from car crashes.  Additionally, this risk is increased if such prescriptions are obtained from a variety of sources.  Therefore, only your primary care physician or a pain management specialist is able to safely treat such syndromes with narcotic medications long-term.    Documentation  revealing such prescriptions have been sought from multiple sources may prohibit [place] from providing a refill or different narcotic medication.  Your name may be checked first through the [facility] Controlled Substances Reporting System.  This database is a record of controlled substance medication prescriptions that the patient has received.  This has been established by [facility] in an effort to eliminate the dangerous, and often life threatening, practice of obtaining multiple prescriptions from different medical providers.   If you have a chronic pain syndrome (i.e. chronic headaches, recurrent back or neck pain, dental pain, abdominal or pelvis pain without a specific diagnosis, or neuropathic pain such as fibromyalgia) or recurrent visits for the same condition without an acute diagnosis, you may be treated with non-narcotics and other non-addictive medicines.  Allergic reactions or negative side effects that may be reported by a patient to such medications will not typically lead to the use of a narcotic analgesic or other controlled substance as an alternative.   Patients managing chronic pain with a personal physician should have provisions in place for breakthrough pain.  If you are in crisis, you should call your physician.  If your physician directs you to the emergency department, please have the doctor call and speak to our attending physician concerning your care.   When patients come to the Emergency Department (ED) with acute medical conditions in which the Emergency Department physician feels appropriate to prescribe narcotic or sedating pain medication, the physician will prescribe these in very limited quantities.  The amount of these medications will last only until you can see your primary care physician in his/her office.  Any patient who returns to the  ED seeking refills should expect only non-narcotic pain medications.   In the event of an acute medical condition exists and  the emergency physician feels it is necessary that the patient be given a narcotic or sedating medication -  a responsible adult driver should be present in the room prior to the medication being given by the nurse.   Prescriptions for narcotic or sedating medications that have been lost, stolen or expired will not be refilled in the Emergency Department.    Patients who have chronic pain may receive non-narcotic prescriptions until seen by their primary care physician.  It is every patients personal responsibility to maintain active prescriptions with his or her primary care physician or specialist.   Emergency Department Resource Guide 1) Find a Doctor and Pay Out of Pocket Although you won't have to find out who is covered by your insurance plan, it is a good idea to ask around and get recommendations. You will then need to call the office and see if the doctor you have chosen will accept you as a new patient and what types of options they offer for patients who are self-pay. Some doctors offer discounts or will set up payment plans for their patients who do not have insurance, but you will need to ask so you aren't surprised when you get to your appointment.  2) Contact Your Local Health Department Not all health departments have doctors that can see patients for sick visits, but many do, so it is worth a call to see if yours does. If you don't know where your local health department is, you can check in your phone book. The CDC also has a tool to help you locate your state's health department, and many state websites also have listings of all of their local health departments.  3) Find a Walk-in Clinic If your illness is not likely to be very severe or complicated, you may want to try a walk in clinic. These are popping up all over the country in pharmacies, drugstores, and shopping centers. They're usually staffed by nurse practitioners or physician assistants that have been trained to treat  common illnesses and complaints. They're usually fairly quick and inexpensive. However, if you have serious medical issues or chronic medical problems, these are probably not your best option.  No Primary Care Doctor: - Call Health Connect at  [phone] - they can help you locate a primary care doctor that  accepts your insurance, provides certain services, etc. - Physician Referral Service- [phone]  Chronic Pain Problems: Organization         Address  Phone   Notes  [facility]  [phone] Patients need to be referred by their primary care doctor.   Medication Assistance: Organization         Address  Phone   Notes  [facility] Medication [facility] [place] [facility] [address] [place] [place], [place] [address] [phone] --Must be a resident of [facility] -- Must have NO insurance coverage whatsoever (no Medicaid/ Medicare, etc.) -- The pt. MUST have a primary care doctor that directs their care regularly and follows them in the community   Med[facility]ist  [phone]   [employer]  [phone]    Agencies that provide inexpensive medical care: Organization         Address  Phone   Notes  [facility]  [phone]   [facility]    [phone]  [facility] [address] [place], [place] [address] [phone]   [facility] [address] [place] [place] [phone]   Planned Parenthood    [phone]   [facility]    [phone]   Community Health and [facility]  [address], [place] Phone:  [phone], Fax:  [phone] Hours of Operation:  9 am - 6 pm, M-F.  Also accepts Medicaid/Medicare and self-pay.  [facility]  [address], [place] Phone: [phone], Fax: [phone]. Hours of Operation:  8:30 am - 5:30 pm, M-F.  Also accepts Medicaid and self-pay.  [facility] [place] [address], [place] Phone: [phone]   [facility] [address] Natasha Bence [place], [place] [phone], Ext. [phone] Mondays & Thursdays: 7-9 AM.  First 15 patients are seen on a first come, first serve basis.    Medicaid-accepting [facility] Providers:  Organization         Address  Phone   Notes  [facility] [address],  [phone] Also accepts self-pay patients.  [facility] [address] Laurell Josephs [place], [place]  [phone]   [facility] [address], [place] [phone]   [facility] [address] [place] [phone]   Renaye Rakers [address], [place]   [phone] Only accepts [place] Access [place] patients after they have their name applied to their card.   Self-Pay (no insurance) in [facility]:  Organization         Address  Phone   Notes  Sickle Cell Patients, [facility] Medicine [address] [place], [place] [phone]   [facility] [address] [place], [place] [phone]   [facility]  [address], [place] [phone]   [facility]/Dr. Osei-Bonsu  [address] [place] or [address], [place] [phone] Phone number for both [place] and [place] locations is the same.  [facility] [address], [place] [phone]   [facility] [address] [place] or [address] [phone]   [facility] [address], [place] [phone], phone; [phone], fax Sees patients 1st and 3rd Saturday of every month.  Must not qualify for public or private insurance (i.e. Medicaid, Medicare, [place] Health Choice, Veterans' Benefits)  Household income should be no more than 200% of the  poverty level The clinic cannot treat you if you are pregnant or think you are pregnant  Sexually transmitted diseases are not treated at the clinic.    Dental Care: Organization         Address  Phone  Notes  [facility] of [facility] [address] [place], [place] [phone] Accepts children up to age 24 who are enrolled in [place] or [place] Health Choice; pregnant women with a Medicaid card; and children who have applied for Medicaid or [place] Health Choice, but were declined, whose parents can pay a reduced fee at time of service.  [facility] of [place]  [address], [place] [phone] Accepts children up to age 63 who  are enrolled in Medicaid or [place] Health Choice; pregnant women with a Medicaid card; and children who have applied for Medicaid or [place] Health Choice, but were declined, whose parents can pay a reduced fee at time of service.  [facility]  [address], [place] [phone] Patients are seen by appointment only. Walk-ins are not accepted. [facility] will see patients 56 years of age and older. Monday - Tuesday (8am-5pm) Most Wednesdays (8:30-5pm) $30 per visit, cash only  [facility]  [address], [facility] [phone] Patients are seen by appointment only. Walk-ins are not accepted. [facility] will see patients 56 years of age and older. One Wednesday Evening (Monthly: Volunteer Based).  $30 per visit, cash only  [employer] [facility] [facility]  [phone] for adults; Children under age 354, call Graduate Pediatric Dentistry at [phone]. Children aged 154-14, please call [phone] to request a pediatric application.  Dental services are provided in all areas of dental care including fillings, crowns and bridges, complete and partial dentures, implants, gum treatment, root canals, and extractions.  Preventive care is also provided. Treatment is provided to both adults and children. Patients are selected via a lottery and there is often a waiting list.   [facility] [address], [place]  [phone] [email or web address]   [facility] [address], [place], [place] [phone], Ext. [phone] Second and Fourth Thursday of each month, opens at 6:30 AM; Clinic ends at 9 AM.  Patients are seen on a first-come first-served basis, and a limited number are seen during each clinic.   [facility]  [address], [place], [place] [phone]   Eligibility Requirements You must have lived in [place], [place], or [place] counties for at least the last three months.   You cannot be eligible for state or federal sponsored [place]healthcare insurance, including CIG[employer]Veterans Administration, [place]icaid, or [employer]icare.   You generally cannot be eligible for healthcare insurance through your employer.    How to apply: Eligibility screenings are held every Tuesday and Wednesday afternoon from 1:00 pm until 4:00 pm. You do not need an appointment for the interview!  [facility] [address], [place], [place] [phone]   [facility]  [phone]   [facility]  [phone]   [facility]  [phone]    Behavioral Health Resources in the Community: Intensive Outpatient Programs Organization         Address  Phone  Notes  [facility] [address], [place], [place] [phone]   [facility] [address], [place], [place] [phone]   [facility] [address], [place], [place]  [phone]   [facility] [address],  [place], [place] [phone] or [phone]   Substance Abuse Resources Organization         Address  Phone  Notes  [facility] Drug Services  [phone]   Addiction  [facility]  [phone]   [facility]  [phone]   [facility]  [phone]   Residential & Outpatient Substance Abuse Program  [phone]   Psychological Services Organization         Address  Phone  Notes  [facility]  [phone]- [phone]   [facility]  [phone]- [phone]   [facility] [address],  [place] [phone] or [phone]    Mobile Crisis Teams Organization         Address  Phone  Notes  [facility], Mobile Crisis Care Unit  [phone]   [facility]  [address] [place], [place] [phone]   Doristine Locks [address] [place] [phone]    Self-Help/Support Groups Organization         Address  Phone             Notes  [facility] [place] - variety of support groups  [phone]- [phone] Call for more information  Narcotics Anonymous (NA), [facility] [address], [employer] [place]  2 meetings at this location   Statistician         Address  Phone  Notes  [facility] [address],    [place]  [phone]   [facility]  [address], [place] [address], [place], [place] [phone]   [facility] [address] [place], [place] [phone] Admissions: 8am-3pm M-F  [facility] [address]    [place], [place] [phone]   [facility] [address] [place], [place], [place] [phone]   [facility] [address]  [place], [place] [phone]   [facility] - Intensive Outpatient [address] Laure[address] Joseph[address], [place], [place] [phone]   [facility] ([facility] [address]  [place], [place] [phone] or [phone]   [facility] ([facility]) [address] [place], [place] [phone] Accepts Medicaid  [facility] [address],  [place]  [phone] Substance Abuse/Addiction Treatment   [facility] Organization         Address  Phone  Notes  [facility]  [phone]   Angie Fava, PhD [address] Ervin Knack [place], [place]   [phone] or [phone]   [facility]   [address] [place], [place] [phone]   [facility] [address], [place], [place] [phone] Insurance/Medicaid/sponsorship through [facility] and Families [address]                                    [place], [place] [phone] Therapy/tele-psych/case  [facility] [place] [facility] [address], [place] [phone]    Dr. Lolly Mustache  [phone]   [facility]  [employer] [facility] Dept. 1) [address], [place] 2) [address] [place] 3)  [address], [place] [phone]  [phone]   [facility] Child Abuse Hotline [phone] or [phone] (After Hours)

## 2014-08-24 NOTE — ED Notes (Signed)
Pt complains of hip pain and bilateral knee pain

## 2014-09-13 ENCOUNTER — Encounter: Payer: Self-pay | Admitting: Physician Assistant

## 2014-09-13 ENCOUNTER — Ambulatory Visit: Payer: No Typology Code available for payment source | Attending: Physician Assistant | Admitting: Physician Assistant

## 2014-09-13 VITALS — BP 117/77 | HR 54 | Temp 98.0°F | Resp 16 | Ht 70.0 in | Wt 173.0 lb

## 2014-09-13 DIAGNOSIS — G894 Chronic pain syndrome: Secondary | ICD-10-CM | POA: Insufficient documentation

## 2014-09-13 DIAGNOSIS — R202 Paresthesia of skin: Secondary | ICD-10-CM

## 2014-09-13 DIAGNOSIS — R200 Anesthesia of skin: Secondary | ICD-10-CM

## 2014-09-13 LAB — COMPLETE METABOLIC PANEL WITH GFR
ALBUMIN: 4.1 g/dL (ref 3.5–5.2)
ALT: 12 U/L (ref 0–53)
AST: 12 U/L (ref 0–37)
Alkaline Phosphatase: 49 U/L (ref 39–117)
BILIRUBIN TOTAL: 0.5 mg/dL (ref 0.2–1.2)
BUN: 25 mg/dL — AB (ref 6–23)
CO2: 26 meq/L (ref 19–32)
Calcium: 9.2 mg/dL (ref 8.4–10.5)
Chloride: 107 mEq/L (ref 96–112)
Creat: 1.2 mg/dL (ref 0.50–1.35)
GFR, EST NON AFRICAN AMERICAN: 68 mL/min
GFR, Est African American: 78 mL/min
Glucose, Bld: 96 mg/dL (ref 70–99)
POTASSIUM: 4.8 meq/L (ref 3.5–5.3)
Sodium: 140 mEq/L (ref 135–145)
TOTAL PROTEIN: 6.8 g/dL (ref 6.0–8.3)

## 2014-09-13 MED ORDER — NAPROXEN SODIUM 220 MG PO TABS: 220.0000 mg | ORAL_TABLET | Freq: Every day | ORAL | Status: DC | PRN
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
## 2014-12-03 ENCOUNTER — Ambulatory Visit: Payer: Self-pay | Attending: Internal Medicine | Admitting: Internal Medicine

## 2014-12-03 ENCOUNTER — Encounter: Payer: Self-pay | Admitting: Internal Medicine

## 2014-12-03 VITALS — BP 130/76 | HR 71 | Temp 98.0°F | Resp 18 | Ht 71.0 in | Wt 177.2 lb

## 2014-12-03 DIAGNOSIS — K219 Gastro-esophageal reflux disease without esophagitis: Secondary | ICD-10-CM | POA: Insufficient documentation

## 2014-12-03 DIAGNOSIS — Z720 Tobacco use: Secondary | ICD-10-CM | POA: Insufficient documentation

## 2014-12-03 DIAGNOSIS — F17200 Nicotine dependence, unspecified, uncomplicated: Secondary | ICD-10-CM | POA: Insufficient documentation

## 2014-12-03 DIAGNOSIS — G8929 Other chronic pain: Secondary | ICD-10-CM | POA: Insufficient documentation

## 2014-12-03 DIAGNOSIS — G894 Chronic pain syndrome: Secondary | ICD-10-CM | POA: Insufficient documentation

## 2014-12-03 DIAGNOSIS — M25562 Pain in left knee: Secondary | ICD-10-CM | POA: Insufficient documentation

## 2014-12-03 DIAGNOSIS — Z1211 Encounter for screening for malignant neoplasm of colon: Secondary | ICD-10-CM

## 2014-12-03 DIAGNOSIS — M25561 Pain in right knee: Secondary | ICD-10-CM | POA: Insufficient documentation

## 2014-12-03 MED ORDER — ESOMEPRAZOLE MAGNESIUM 40 MG PO CPDR: 40.0000 mg | DELAYED_RELEASE_CAPSULE | Freq: Every day | ORAL | Status: DC

## 2014-12-03 MED ORDER — NAPROXEN 500 MG PO TABS: 500.0000 mg | ORAL_TABLET | Freq: Two times a day (BID) | ORAL | Status: DC
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
## 2014-12-03 NOTE — Progress Notes (Deleted)
MRN: [id number] Name: Cody [name]  Sex: male Age: 54 y.o. DOB: [birth year]  Allergies: Review of patient's allergies indicates no known allergies.  Chief Complaint  Patient presents with  . Pain    HPI: Patient is 54 y.o. male who   Past Medical History  Diagnosis Date  . Tendonitis   . Chronic pain     History reviewed. No pertinent past surgical history.    Medication List       This list is accurate as of: 12/03/14 12:54 PM.  Always use your most recent med list.               acetaminophen 500 MG tablet  Commonly known as:  TYLENOL  Take 1,500 mg by mouth every 6 (six) hours as needed for pain.     esomeprazole 40 MG capsule  Commonly known as:  NEXIUM  Take 1 capsule (40 mg total) by mouth daily at 12 noon.     naproxen sodium 220 MG tablet  Commonly known as:  ALEVE  Take 1 tablet (220 mg total) by mouth daily as needed (for pain.).        Meds ordered this encounter  Medications  . esomeprazole (NEXIUM) 40 MG capsule    Sig: Take 1 capsule (40 mg total) by mouth daily at 12 noon.    Dispense:  30 capsule    Refill:  3     There is no immunization history on file for this patient.  Family History  Problem Relation Age of Onset  . Diabetes Mother   . Diabetes Father   . Diabetes Brother     History  Substance Use Topics  . Smoking status: Current Every Day Smoker -- 0.25 packs/day    Types: Cigarettes  . Smokeless tobacco: Not on file  . Alcohol Use: Yes     Comment: 1 pint of liquor per week    Review of Systems   As noted in HPI  Filed Vitals:   12/03/14 1224  BP: 130/76  Pulse: 71  Temp: 98 F (36.7 C)  Resp: 18    Physical Exam  Physical Exam  CBC    Component Value Date/Time   WBC 8.1 12/08/2009 2019   RBC 3.40* 12/08/2009 2019   HGB 12.9* 12/08/2009 2053   HCT 38.0* 12/08/2009 2053   PLT 192 12/08/2009 2019   MCV 102.6* 12/08/2009 2019    CMP     Component Value Date/Time   NA 140 09/13/2014  1153   K 4.8 09/13/2014 1153   CL 107 09/13/2014 1153   CO2 26 09/13/2014 1153   GLUCOSE 96 09/13/2014 1153   BUN 25* 09/13/2014 1153   CREATININE 1.20 09/13/2014 1153   CREATININE 1.5 12/08/2009 2053   CALCIUM 9.2 09/13/2014 1153   PROT 6.8 09/13/2014 1153   ALBUMIN 4.1 09/13/2014 1153   AST 12 09/13/2014 1153   ALT 12 09/13/2014 1153   ALKPHOS 49 09/13/2014 1153   BILITOT 0.5 09/13/2014 1153   GFRNONAA 68 09/13/2014 1153   GFRNONAA 57* 12/08/2009 2019   GFRAA 78 09/13/2014 1153   GFRAA  12/08/2009 2019    >60        The eGFR has been calculated using the MDRD equation. This calculation has not been validated in all clinical situations. eGFR's persistently <60 mL/min signify possible Chronic Kidney Disease.    No results found for: CHOL  No results found for: HGBA1C  Lab Results  Component Value  Date/Time   AST 12 09/13/2014 11:53 AM    Assessment and Plan  Gastroesophageal reflux disease, esophagitis presence not specified - Plan: esomeprazole (NEXIUM) 40 MG capsule, Ambulatory referral to Gastroenterology  Chronic pain syndrome  Bilateral knee pain  Special screening for malignant neoplasms, colon - Plan: Ambulatory referral to Gastroenterology   Health Maintenance -Colonoscopy: -Pap Smear: -Mammogram: -Vaccinations:  -TdAP  -PNA (PPSV23) (one dose after 65) (or one dose before 65 if chronic conditions)  -Zoster (1 dose after 60 yrs)  -Influenza  No Follow-up on file.   This note has been created with Surveyor, quantity. Any transcriptional errors are unintentional.    Lorayne Marek, MD
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
## 2014-12-03 NOTE — Patient Instructions (Signed)
Smoking Cessation Quitting smoking is important to your health and has many advantages. However, it is not always easy to quit since nicotine is a very addictive drug. Oftentimes, people try 3 times or more before being able to quit. This document explains the best ways for you to prepare to quit smoking. Quitting takes hard work and a lot of effort, but you can do it. ADVANTAGES OF QUITTING SMOKING  You will live longer, feel better, and live better.  Your body will feel the impact of quitting smoking almost immediately.  Within 20 minutes, blood pressure decreases. Your pulse returns to its normal level.  After 8 hours, carbon monoxide levels in the blood return to normal. Your oxygen level increases.  After 24 hours, the chance of having a heart attack starts to decrease. Your breath, hair, and body stop smelling like smoke.  After 48 hours, damaged nerve endings begin to recover. Your sense of taste and smell improve.  After 72 hours, the body is virtually free of nicotine. Your bronchial tubes relax and breathing becomes easier.  After 2 to 12 weeks, lungs can hold more air. Exercise becomes easier and circulation improves.  The risk of having a heart attack, stroke, cancer, or lung disease is greatly reduced.  After 1 year, the risk of coronary heart disease is cut in half.  After 5 years, the risk of stroke falls to the same as a nonsmoker.  After 10 years, the risk of lung cancer is cut in half and the risk of other cancers decreases significantly.  After 15 years, the risk of coronary heart disease drops, usually to the level of a nonsmoker.  If you are pregnant, quitting smoking will improve your chances of having a healthy baby.  The people you live with, especially any children, will be healthier.  You will have extra money to spend on things other than cigarettes. QUESTIONS TO THINK ABOUT BEFORE ATTEMPTING TO QUIT You may want to talk about your answers with your  health care provider.  Why do you want to quit?  If you tried to quit in the past, what helped and what did not?  What will be the most difficult situations for you after you quit? How will you plan to handle them?  Who can help you through the tough times? Your family? Friends? A health care provider?  What pleasures do you get from smoking? What ways can you still get pleasure if you quit? Here are some questions to ask your health care provider:  How can you help me to be successful at quitting?  What medicine do you think would be best for me and how should I take it?  What should I do if I need more help?  What is smoking withdrawal like? How can I get information on withdrawal? GET READY  Set a quit date.  Change your environment by getting rid of all cigarettes, ashtrays, matches, and lighters in your home, car, or work. Do not let people smoke in your home.  Review your past attempts to quit. Think about what worked and what did not. GET SUPPORT AND ENCOURAGEMENT You have a better chance of being successful if you have help. You can get support in many ways.  Tell your family, friends, and coworkers that you are going to quit and need their support. Ask them not to smoke around you.  Get individual, group, or telephone counseling and support. Programs are available at local hospitals and health centers. Call   your local health department for information about programs in your area.  Spiritual beliefs and practices may help some smokers quit.  Download a "quit meter" on your computer to keep track of quit statistics, such as how long you have gone without smoking, cigarettes not smoked, and money saved.  Get a self-help book about quitting smoking and staying off tobacco. LEARN NEW SKILLS AND BEHAVIORS  Distract yourself from urges to smoke. Talk to someone, go for a walk, or occupy your time with a task.  Change your normal routine. Take a different route to work.  Drink tea instead of coffee. Eat breakfast in a different place.  Reduce your stress. Take a hot bath, exercise, or read a book.  Plan something enjoyable to do every day. Reward yourself for not smoking.  Explore interactive web-based programs that specialize in helping you quit. GET MEDICINE AND USE IT CORRECTLY Medicines can help you stop smoking and decrease the urge to smoke. Combining medicine with the above behavioral methods and support can greatly increase your chances of successfully quitting smoking.  Nicotine replacement therapy helps deliver nicotine to your body without the negative effects and risks of smoking. Nicotine replacement therapy includes nicotine gum, lozenges, inhalers, nasal sprays, and skin patches. Some may be available over-the-counter and others require a prescription.  Antidepressant medicine helps people abstain from smoking, but how this works is unknown. This medicine is available by prescription.  Nicotinic receptor partial agonist medicine simulates the effect of nicotine in your brain. This medicine is available by prescription. Ask your health care provider for advice about which medicines to use and how to use them based on your health history. Your health care provider will tell you what side effects to look out for if you choose to be on a medicine or therapy. Carefully read the information on the package. Do not use any other product containing nicotine while using a nicotine replacement product.  RELAPSE OR DIFFICULT SITUATIONS Most relapses occur within the first 3 months after quitting. Do not be discouraged if you start smoking again. Remember, most people try several times before finally quitting. You may have symptoms of withdrawal because your body is used to nicotine. You may crave cigarettes, be irritable, feel very hungry, cough often, get headaches, or have difficulty concentrating. The withdrawal symptoms are only temporary. They are strongest  when you first quit, but they will go away within 10-14 days. To reduce the chances of relapse, try to:  Avoid drinking alcohol. Drinking lowers your chances of successfully quitting.  Reduce the amount of caffeine you consume. Once you quit smoking, the amount of caffeine in your body increases and can give you symptoms, such as a rapid heartbeat, sweating, and anxiety.  Avoid smokers because they can make you want to smoke.  Do not let weight gain distract you. Many smokers will gain weight when they quit, usually less than 10 pounds. Eat a healthy diet and stay active. You can always lose the weight gained after you quit.  Find ways to improve your mood other than smoking. FOR MORE INFORMATION  www.smokefree.gov  Document Released: 07/24/2001 Document Revised: 12/14/2013 Document Reviewed: 11/08/2011 [employer] Patient Information 2015 [employer]. This information is not intended to replace advice given to you by your health care provider. Make sure you discuss any questions you have with your health care provider.  

## 2014-12-14 ENCOUNTER — Other Ambulatory Visit: Payer: Self-pay | Admitting: Family Medicine

## 2014-12-14 MED ORDER — OMEPRAZOLE 20 MG PO CPDR: 20.0000 mg | DELAYED_RELEASE_CAPSULE | Freq: Every day | ORAL | Status: DC

## 2015-08-16 ENCOUNTER — Inpatient Hospital Stay: Payer: No Typology Code available for payment source

## 2015-10-18 ENCOUNTER — Inpatient Hospital Stay (HOSPITAL_COMMUNITY)
Admission: EM | Admit: 2015-10-18 | Discharge: 2015-10-21 | DRG: 493 | Disposition: A | Discharge status: Home Health | Payer: Medicaid Other | Attending: Orthopaedic Surgery | Admitting: Orthopaedic Surgery

## 2015-10-18 ENCOUNTER — Emergency Department (HOSPITAL_COMMUNITY): Payer: Medicaid Other

## 2015-10-18 ENCOUNTER — Encounter (HOSPITAL_COMMUNITY): Payer: Self-pay | Admitting: Emergency Medicine

## 2015-10-18 ENCOUNTER — Emergency Department (HOSPITAL_COMMUNITY): Payer: Medicaid Other | Admitting: Certified Registered Nurse Anesthetist

## 2015-10-18 ENCOUNTER — Encounter (HOSPITAL_COMMUNITY)
Admission: EM | Disposition: A | Discharge status: Home Health | Payer: Self-pay | Source: Home / Self Care | Attending: Orthopaedic Surgery

## 2015-10-18 DIAGNOSIS — T148XXA Other injury of unspecified body region, initial encounter: Secondary | ICD-10-CM

## 2015-10-18 DIAGNOSIS — Z8781 Personal history of (healed) traumatic fracture: Secondary | ICD-10-CM

## 2015-10-18 DIAGNOSIS — S82202C Unspecified fracture of shaft of left tibia, initial encounter for open fracture type IIIA, IIIB, or IIIC: Secondary | ICD-10-CM

## 2015-10-18 DIAGNOSIS — S82402C Unspecified fracture of shaft of left fibula, initial encounter for open fracture type IIIA, IIIB, or IIIC: Secondary | ICD-10-CM

## 2015-10-18 DIAGNOSIS — Y92410 Unspecified street and highway as the place of occurrence of the external cause: Secondary | ICD-10-CM

## 2015-10-18 DIAGNOSIS — Z23 Encounter for immunization: Secondary | ICD-10-CM

## 2015-10-18 DIAGNOSIS — D62 Acute posthemorrhagic anemia: Secondary | ICD-10-CM | POA: Diagnosis not present

## 2015-10-18 DIAGNOSIS — S82452B Displaced comminuted fracture of shaft of left fibula, initial encounter for open fracture type I or II: Secondary | ICD-10-CM | POA: Diagnosis present

## 2015-10-18 DIAGNOSIS — Z9889 Other specified postprocedural states: Secondary | ICD-10-CM

## 2015-10-18 DIAGNOSIS — S82252C Displaced comminuted fracture of shaft of left tibia, initial encounter for open fracture type IIIA, IIIB, or IIIC: Principal | ICD-10-CM | POA: Diagnosis present

## 2015-10-18 DIAGNOSIS — F17200 Nicotine dependence, unspecified, uncomplicated: Secondary | ICD-10-CM | POA: Diagnosis present

## 2015-10-18 HISTORY — DX: Personal history of other diseases of the musculoskeletal system and connective tissue: Z87.39

## 2015-10-18 HISTORY — DX: Other seasonal allergic rhinitis: J30.2

## 2015-10-18 HISTORY — PX: TIBIA IM NAIL INSERTION: SHX2516

## 2015-10-18 HISTORY — DX: Unspecified osteoarthritis, unspecified site: M19.90

## 2015-10-18 HISTORY — DX: Family history of other specified conditions: Z84.89

## 2015-10-18 HISTORY — PX: I & D EXTREMITY: SHX5045

## 2015-10-18 HISTORY — DX: Gastro-esophageal reflux disease without esophagitis: K21.9

## 2015-10-18 LAB — COMPREHENSIVE METABOLIC PANEL
ALK PHOS: 54 U/L (ref 38–126)
ALT: 17 U/L (ref 17–63)
ANION GAP: 14 (ref 5–15)
AST: 22 U/L (ref 15–41)
Albumin: 3.9 g/dL (ref 3.5–5.0)
BILIRUBIN TOTAL: 0.7 mg/dL (ref 0.3–1.2)
BUN: 9 mg/dL (ref 6–20)
CALCIUM: 9.4 mg/dL (ref 8.9–10.3)
CO2: 22 mmol/L (ref 22–32)
Chloride: 106 mmol/L (ref 101–111)
Creatinine, Ser: 1.21 mg/dL (ref 0.61–1.24)
GFR calc Af Amer: >60 mL/min (ref >60)
GLUCOSE: 102 mg/dL — AB (ref 65–99)
POTASSIUM: 3.7 mmol/L (ref 3.5–5.1)
Sodium: 142 mmol/L (ref 135–145)
TOTAL PROTEIN: 6.8 g/dL (ref 6.5–8.1)

## 2015-10-18 LAB — ETHANOL: ALCOHOL ETHYL (B): 15 mg/dL — AB (ref <5)

## 2015-10-18 LAB — PROTIME-INR
INR: 1.07 (ref 0.00–1.49)
PROTHROMBIN TIME: 14.1 s (ref 11.6–15.2)

## 2015-10-18 LAB — CBC
HEMATOCRIT: 38.9 % — AB (ref 39.0–52.0)
Hemoglobin: 13.6 g/dL (ref 13.0–17.0)
MCH: 35.1 pg — ABNORMAL HIGH (ref 26.0–34.0)
MCHC: 35 g/dL (ref 30.0–36.0)
MCV: 100.5 fL — ABNORMAL HIGH (ref 78.0–100.0)
Platelets: 249 10*3/uL (ref 150–400)
RBC: 3.87 MIL/uL — ABNORMAL LOW (ref 4.22–5.81)
RDW: 12.7 % (ref 11.5–15.5)
WBC: 7.1 10*3/uL (ref 4.0–10.5)

## 2015-10-18 LAB — CDS SEROLOGY

## 2015-10-18 SURGERY — IRRIGATION AND DEBRIDEMENT EXTREMITY
Anesthesia: General | Site: Leg Lower | Laterality: Left

## 2015-10-18 MED ORDER — CEFAZOLIN SODIUM-DEXTROSE 2-3 GM-% IV SOLR
2.0000 g | Freq: Once | INTRAVENOUS | Status: AC
  Administered 2015-10-18: 2 g via INTRAVENOUS

## 2015-10-18 MED ORDER — ROCURONIUM BROMIDE 50 MG/5ML IV SOLN
INTRAVENOUS | Status: AC
  Filled 2015-10-18: qty 1

## 2015-10-18 MED ORDER — ETOMIDATE 2 MG/ML IV SOLN
INTRAVENOUS | Status: AC
  Filled 2015-10-18: qty 10

## 2015-10-18 MED ORDER — SUCCINYLCHOLINE CHLORIDE 20 MG/ML IJ SOLN
INTRAMUSCULAR | Status: AC
  Filled 2015-10-18: qty 1

## 2015-10-18 MED ORDER — OXYCODONE-ACETAMINOPHEN 5-325 MG PO TABS: 1.0000 | ORAL_TABLET | ORAL | Status: DC | PRN

## 2015-10-18 MED ORDER — HYDROMORPHONE HCL 1 MG/ML IJ SOLN
1.0000 mg | Freq: Once | INTRAMUSCULAR | Status: AC
  Administered 2015-10-18: 1 mg via INTRAVENOUS

## 2015-10-18 MED ORDER — LIDOCAINE HCL (CARDIAC) 20 MG/ML IV SOLN
INTRAVENOUS | Status: DC | PRN
  Administered 2015-10-18: 50 mg via INTRAVENOUS

## 2015-10-18 MED ORDER — HYDROMORPHONE HCL 1 MG/ML IJ SOLN
0.2500 mg | INTRAMUSCULAR | Status: DC | PRN
  Administered 2015-10-19: 0.5 mg via INTRAVENOUS

## 2015-10-18 MED ORDER — MIDAZOLAM HCL 5 MG/5ML IJ SOLN
INTRAMUSCULAR | Status: DC | PRN
  Administered 2015-10-18: 2 mg via INTRAVENOUS

## 2015-10-18 MED ORDER — ASPIRIN EC 325 MG PO TBEC: 325.0000 mg | DELAYED_RELEASE_TABLET | Freq: Two times a day (BID) | ORAL | Status: DC

## 2015-10-18 MED ORDER — FENTANYL CITRATE (PF) 100 MCG/2ML IJ SOLN
INTRAMUSCULAR | Status: DC | PRN
  Administered 2015-10-18 (×2): 50 ug via INTRAVENOUS
  Administered 2015-10-18: 100 ug via INTRAVENOUS
  Administered 2015-10-18: 50 ug via INTRAVENOUS

## 2015-10-18 MED ORDER — 0.9 % SODIUM CHLORIDE (POUR BTL) OPTIME
TOPICAL | Status: DC | PRN
  Administered 2015-10-18: 1000 mL

## 2015-10-18 MED ORDER — OXYCODONE HCL 5 MG/5ML PO SOLN: 5.0000 mg | Freq: Once | ORAL | Status: AC | PRN

## 2015-10-18 MED ORDER — METHOCARBAMOL 750 MG PO TABS: 750.0000 mg | ORAL_TABLET | Freq: Two times a day (BID) | ORAL | Status: DC | PRN

## 2015-10-18 MED ORDER — SUCCINYLCHOLINE CHLORIDE 20 MG/ML IJ SOLN
INTRAMUSCULAR | Status: DC | PRN
  Administered 2015-10-18: 100 mg via INTRAVENOUS

## 2015-10-18 MED ORDER — OXYCODONE HCL ER 10 MG PO T12A: 10.0000 mg | EXTENDED_RELEASE_TABLET | Freq: Two times a day (BID) | ORAL | Status: DC

## 2015-10-18 MED ORDER — PROPOFOL 10 MG/ML IV BOLUS
INTRAVENOUS | Status: DC | PRN
  Administered 2015-10-18: 170 mg via INTRAVENOUS

## 2015-10-18 MED ORDER — ONDANSETRON HCL 4 MG/2ML IJ SOLN
INTRAMUSCULAR | Status: DC | PRN
  Administered 2015-10-18: 4 mg via INTRAVENOUS

## 2015-10-18 MED ORDER — SODIUM CHLORIDE 0.9 % IR SOLN
Status: DC | PRN
  Administered 2015-10-18: 3000 mL

## 2015-10-18 MED ORDER — SENNOSIDES-DOCUSATE SODIUM 8.6-50 MG PO TABS: 1.0000 | ORAL_TABLET | Freq: Every evening | ORAL | Status: DC | PRN

## 2015-10-18 MED ORDER — DEXAMETHASONE SODIUM PHOSPHATE 4 MG/ML IJ SOLN
INTRAMUSCULAR | Status: DC | PRN
  Administered 2015-10-18: 8 mg via INTRAVENOUS

## 2015-10-18 MED ORDER — ONDANSETRON HCL 4 MG/2ML IJ SOLN
4.0000 mg | Freq: Once | INTRAMUSCULAR | Status: AC
  Administered 2015-10-18: 4 mg via INTRAVENOUS

## 2015-10-18 MED ORDER — LACTATED RINGERS IV SOLN
INTRAVENOUS | Status: DC | PRN
  Administered 2015-10-18 (×2): via INTRAVENOUS

## 2015-10-18 MED ORDER — SODIUM CHLORIDE 0.9 % IV BOLUS (SEPSIS)
125.0000 mL | Freq: Once | INTRAVENOUS | Status: AC
  Administered 2015-10-18: 125 mL via INTRAVENOUS

## 2015-10-18 MED ORDER — FENTANYL CITRATE (PF) 250 MCG/5ML IJ SOLN
INTRAMUSCULAR | Status: AC
  Filled 2015-10-18: qty 5

## 2015-10-18 MED ORDER — ONDANSETRON HCL 4 MG/2ML IJ SOLN
INTRAMUSCULAR | Status: AC
  Filled 2015-10-18: qty 2

## 2015-10-18 MED ORDER — ONDANSETRON HCL 4 MG PO TABS: 4.0000 mg | ORAL_TABLET | Freq: Three times a day (TID) | ORAL | Status: DC | PRN

## 2015-10-18 MED ORDER — OXYCODONE HCL 5 MG PO TABS
5.0000 mg | ORAL_TABLET | Freq: Once | ORAL | Status: AC | PRN
  Administered 2015-10-19: 5 mg via ORAL

## 2015-10-18 MED ORDER — MIDAZOLAM HCL 2 MG/2ML IJ SOLN
INTRAMUSCULAR | Status: AC
  Filled 2015-10-18: qty 2

## 2015-10-18 MED ORDER — EPHEDRINE SULFATE 50 MG/ML IJ SOLN
INTRAMUSCULAR | Status: AC
  Filled 2015-10-18: qty 1

## 2015-10-18 MED ORDER — HYDROMORPHONE HCL 1 MG/ML IJ SOLN
INTRAMUSCULAR | Status: AC
  Filled 2015-10-18: qty 1

## 2015-10-18 MED ORDER — NEOSTIGMINE METHYLSULFATE 10 MG/10ML IV SOLN
INTRAVENOUS | Status: DC | PRN
  Administered 2015-10-18: 3 mg via INTRAVENOUS

## 2015-10-18 MED ORDER — PROPOFOL 10 MG/ML IV BOLUS
INTRAVENOUS | Status: AC
  Filled 2015-10-18: qty 20

## 2015-10-18 MED ORDER — ONDANSETRON HCL 4 MG/2ML IJ SOLN: 4.0000 mg | Freq: Four times a day (QID) | INTRAMUSCULAR | Status: DC | PRN

## 2015-10-18 MED ORDER — LIDOCAINE HCL (CARDIAC) 20 MG/ML IV SOLN
INTRAVENOUS | Status: AC
  Filled 2015-10-18: qty 5

## 2015-10-18 MED ORDER — GLYCOPYRROLATE 0.2 MG/ML IJ SOLN
INTRAMUSCULAR | Status: DC | PRN
  Administered 2015-10-18: 0.4 mg via INTRAVENOUS

## 2015-10-18 MED ORDER — ROCURONIUM BROMIDE 100 MG/10ML IV SOLN
INTRAVENOUS | Status: DC | PRN
  Administered 2015-10-18: 50 mg via INTRAVENOUS

## 2015-10-18 MED ORDER — PHENYLEPHRINE 40 MCG/ML (10ML) SYRINGE FOR IV PUSH (FOR BLOOD PRESSURE SUPPORT)
PREFILLED_SYRINGE | INTRAVENOUS | Status: AC
  Filled 2015-10-18: qty 10

## 2015-10-18 MED ORDER — DEXAMETHASONE SODIUM PHOSPHATE 4 MG/ML IJ SOLN
INTRAMUSCULAR | Status: AC
  Filled 2015-10-18: qty 2

## 2015-10-18 MED ORDER — TETANUS-DIPHTH-ACELL PERTUSSIS 5-2.5-18.5 LF-MCG/0.5 IM SUSP
0.5000 mL | Freq: Once | INTRAMUSCULAR | Status: AC
  Administered 2015-10-18: 0.5 mL via INTRAMUSCULAR
  Filled 2015-10-18: qty 0.5

## 2015-10-18 SURGICAL SUPPLY — 101 items
BANDAGE ELASTIC 3 VELCRO ST LF (GAUZE/BANDAGES/DRESSINGS) IMPLANT
BANDAGE ELASTIC 4 VELCRO ST LF (GAUZE/BANDAGES/DRESSINGS) ×3 IMPLANT
BANDAGE ELASTIC 6 VELCRO ST LF (GAUZE/BANDAGES/DRESSINGS) ×5 IMPLANT
BANDAGE ESMARK 6X9 LF (GAUZE/BANDAGES/DRESSINGS) ×1 IMPLANT
BIT DRILL LONG 4.0 (BIT) IMPLANT
BIT DRILL SHORT 4.0 (BIT) IMPLANT
BLADE SURG 10 STRL SS (BLADE) ×3 IMPLANT
BNDG CMPR 9X6 STRL LF SNTH (GAUZE/BANDAGES/DRESSINGS)
BNDG COHESIVE 1X5 TAN STRL LF (GAUZE/BANDAGES/DRESSINGS) IMPLANT
BNDG COHESIVE 4X5 TAN STRL (GAUZE/BANDAGES/DRESSINGS) ×3 IMPLANT
BNDG COHESIVE 6X5 TAN STRL LF (GAUZE/BANDAGES/DRESSINGS) ×4 IMPLANT
BNDG CONFORM 3 STRL LF (GAUZE/BANDAGES/DRESSINGS) IMPLANT
BNDG ESMARK 6X9 LF (GAUZE/BANDAGES/DRESSINGS)
BNDG GAUZE ELAST 4 BULKY (GAUZE/BANDAGES/DRESSINGS) ×2 IMPLANT
BNDG GAUZE STRTCH 6 (GAUZE/BANDAGES/DRESSINGS) ×9 IMPLANT
CORDS BIPOLAR (ELECTRODE) IMPLANT
COVER MAYO STAND STRL (DRAPES) ×3 IMPLANT
COVER SURGICAL LIGHT HANDLE (MISCELLANEOUS) ×3 IMPLANT
CUFF TOURNIQUET SINGLE 24IN (TOURNIQUET CUFF) IMPLANT
CUFF TOURNIQUET SINGLE 34IN LL (TOURNIQUET CUFF) ×2 IMPLANT
CUFF TOURNIQUET SINGLE 44IN (TOURNIQUET CUFF) IMPLANT
DRAPE C-ARM 42X72 X-RAY (DRAPES) ×3 IMPLANT
DRAPE C-ARMOR (DRAPES) ×3 IMPLANT
DRAPE EXTREMITY BILATERAL (DRAPES) IMPLANT
DRAPE EXTREMITY T 121X128X90 (DRAPE) ×3 IMPLANT
DRAPE IMP U-DRAPE 54X76 (DRAPES) ×3 IMPLANT
DRAPE INCISE IOBAN 66X45 STRL (DRAPES) ×6 IMPLANT
DRAPE POUCH INSTRU U-SHP 10X18 (DRAPES) ×1 IMPLANT
DRAPE SURG 17X23 STRL (DRAPES) IMPLANT
DRAPE U-SHAPE 47X51 STRL (DRAPES) ×3 IMPLANT
DRAPE UTILITY XL STRL (DRAPES) ×4 IMPLANT
DRILL BIT LONG 4.0 (BIT) ×6
DRILL BIT SHORT 4.0 (BIT) ×6
DRSG MEPILEX BORDER 4X12 (GAUZE/BANDAGES/DRESSINGS) ×2 IMPLANT
DRSG MEPILEX BORDER 4X8 (GAUZE/BANDAGES/DRESSINGS) ×2 IMPLANT
DURAPREP 26ML APPLICATOR (WOUND CARE) ×3 IMPLANT
ELECT CAUTERY BLADE 6.4 (BLADE) ×3 IMPLANT
ELECT REM PT RETURN 9FT ADLT (ELECTROSURGICAL) ×3
ELECTRODE REM PT RTRN 9FT ADLT (ELECTROSURGICAL) ×1 IMPLANT
FACESHIELD WRAPAROUND (MASK) IMPLANT
FACESHIELD WRAPAROUND OR TEAM (MASK) IMPLANT
GAUZE SPONGE 4X4 12PLY STRL (GAUZE/BANDAGES/DRESSINGS) ×6 IMPLANT
GAUZE XEROFORM 1X8 LF (GAUZE/BANDAGES/DRESSINGS) ×2 IMPLANT
GAUZE XEROFORM 5X9 LF (GAUZE/BANDAGES/DRESSINGS) ×5 IMPLANT
GLOVE BIO SURGEON STRL SZ7.5 (GLOVE) ×2 IMPLANT
GLOVE BIOGEL PI IND STRL 6.5 (GLOVE) IMPLANT
GLOVE BIOGEL PI INDICATOR 6.5 (GLOVE) ×2
GLOVE SKINSENSE NS SZ7.5 (GLOVE) ×2
GLOVE SKINSENSE STRL SZ7.5 (GLOVE) ×4 IMPLANT
GOWN STRL REIN XL XLG (GOWN DISPOSABLE) ×4 IMPLANT
GUIDE PIN 3.2MM (MISCELLANEOUS) ×3
GUIDE PIN ORTH 343X3.2XBRAD (MISCELLANEOUS) IMPLANT
GUIDE ROD 3.0 (MISCELLANEOUS) ×3
HANDPIECE INTERPULSE COAX TIP (DISPOSABLE)
KIT BASIN OR (CUSTOM PROCEDURE TRAY) ×3 IMPLANT
KIT ROOM TURNOVER OR (KITS) ×3 IMPLANT
MANIFOLD NEPTUNE II (INSTRUMENTS) ×3 IMPLANT
NAIL TIBIAL 11.5X38 (Nail) ×2 IMPLANT
NS IRRIG 1000ML POUR BTL (IV SOLUTION) ×4 IMPLANT
PACK ORTHO EXTREMITY (CUSTOM PROCEDURE TRAY) ×1 IMPLANT
PACK TOTAL JOINT (CUSTOM PROCEDURE TRAY) ×3 IMPLANT
PACK UNIVERSAL I (CUSTOM PROCEDURE TRAY) ×1 IMPLANT
PAD ABD 8X10 STRL (GAUZE/BANDAGES/DRESSINGS) ×3 IMPLANT
PAD ARMBOARD 7.5X6 YLW CONV (MISCELLANEOUS) ×6 IMPLANT
PAD CAST 4YDX4 CTTN HI CHSV (CAST SUPPLIES) ×2 IMPLANT
PADDING CAST ABS 4INX4YD NS (CAST SUPPLIES)
PADDING CAST ABS COTTON 4X4 ST (CAST SUPPLIES) ×2 IMPLANT
PADDING CAST COTTON 4X4 STRL (CAST SUPPLIES) ×6
PADDING CAST COTTON 6X4 STRL (CAST SUPPLIES) ×1 IMPLANT
ROD GUIDE 3.0 (MISCELLANEOUS) IMPLANT
SCREW TRIGEN LOW PROF 5.0X32.5 (Screw) ×2 IMPLANT
SCREW TRIGEN LOW PROF 5.0X37.5 (Screw) ×2 IMPLANT
SCREW TRIGEN LOW PROF 5.0X40 (Screw) ×2 IMPLANT
SCREW TRIGEN LOW PROF 5.0X52.5 (Screw) ×2 IMPLANT
SET HNDPC FAN SPRY TIP SCT (DISPOSABLE) IMPLANT
SLEEVE SURGEON STRL (DRAPES) ×2 IMPLANT
SPONGE GAUZE 4X4 12PLY STER LF (GAUZE/BANDAGES/DRESSINGS) ×2 IMPLANT
SPONGE LAP 18X18 X RAY DECT (DISPOSABLE) ×2 IMPLANT
STAPLER SKIN PROX WIDE 3.9 (STAPLE) ×1 IMPLANT
STAPLER VISISTAT 35W (STAPLE) ×2 IMPLANT
STOCKINETTE IMPERVIOUS 9X36 MD (GAUZE/BANDAGES/DRESSINGS) ×3 IMPLANT
SUT ETHILON 2 0 FS 18 (SUTURE) ×7 IMPLANT
SUT ETHILON 2 0 PSLX (SUTURE) ×1 IMPLANT
SUT ETHILON 3 0 PS 1 (SUTURE) ×4 IMPLANT
SUT VIC AB 0 CT1 27 (SUTURE) ×9
SUT VIC AB 0 CT1 27XBRD ANTBC (SUTURE) ×2 IMPLANT
SUT VIC AB 2-0 CT1 27 (SUTURE) ×6
SUT VIC AB 2-0 CT1 36 (SUTURE) ×3 IMPLANT
SUT VIC AB 2-0 CT1 TAPERPNT 27 (SUTURE) ×2 IMPLANT
SUT VIC AB 2-0 FS1 27 (SUTURE) ×4 IMPLANT
SYR CONTROL 10ML LL (SYRINGE) IMPLANT
TOWEL OR 17X24 6PK STRL BLUE (TOWEL DISPOSABLE) ×3 IMPLANT
TOWEL OR 17X26 10 PK STRL BLUE (TOWEL DISPOSABLE) ×4 IMPLANT
TUBE ANAEROBIC SPECIMEN COL (MISCELLANEOUS) IMPLANT
TUBE CONNECTING 12'X1/4 (SUCTIONS) ×1
TUBE CONNECTING 12X1/4 (SUCTIONS) ×2 IMPLANT
TUBE FEEDING 5FR 15 INCH (TUBING) IMPLANT
TUBING CYSTO DISP (UROLOGICAL SUPPLIES) ×3 IMPLANT
UNDERPAD 30X30 INCONTINENT (UNDERPADS AND DIAPERS) ×4 IMPLANT
WATER STERILE IRR 1000ML POUR (IV SOLUTION) ×1 IMPLANT
YANKAUER SUCT BULB TIP NO VENT (SUCTIONS) ×3 IMPLANT
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
## 2015-10-18 NOTE — Progress Notes (Signed)
RT NOTE:  Level 2 Pt alert, airway intact, clear BBS. RA (Sats 99%) Pt talking, answering questions.
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
## 2015-10-18 NOTE — ED Notes (Signed)
Dr. Roda ShuttersXu paged to [phone] @ 2023.
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
## 2015-10-18 NOTE — Anesthesia Procedure Notes (Signed)
Procedure Name: Intubation Date/Time: 10/18/2015 10:00 PM Performed by: Little IshikawaMERCER, Nannette Zill L Pre-anesthesia Checklist: Patient identified, Emergency Drugs available, Suction available, Patient being monitored and Timeout performed Patient Re-evaluated:Patient Re-evaluated prior to inductionOxygen Delivery Method: Circle system utilized Preoxygenation: Pre-oxygenation with 100% oxygen Intubation Type: IV induction Ventilation: Mask ventilation without difficulty Laryngoscope Size: Mac and 4 Tube type: Oral Tube size: 7.5 mm Number of attempts: 1 Airway Equipment and Method: Stylet Placement Confirmation: ETT inserted through vocal cords under direct vision,  positive ETCO2 and breath sounds checked- equal and bilateral Secured at: 22 cm Tube secured with: Tape Dental Injury: Teeth and Oropharynx as per pre-operative assessment
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
## 2015-10-18 NOTE — Progress Notes (Signed)
Orthopedic Tech Progress Note Patient Details:  Cody [name] Kunka 07/20/1961 [id number] Assisted with orthopedic assessment and stabilized LLE during x-rays.  Accompanied pt. to CT to help with transfer from stretcher to CT table and return.  Patient ID: Cody [name] Villalona, male   DOB: 09/13/1960, 55 y.o.   MRN: [id number]   Lesle ChrisGilliland, Keian Odriscoll L 10/18/2015, 9:25 PM

## 2015-10-18 NOTE — Anesthesia Preprocedure Evaluation (Addendum)
Anesthesia Evaluation  Patient identified by MRN, date of birth, ID band Patient awake    Reviewed: Allergy & Precautions, NPO status , Patient's Chart, lab work & pertinent test results  History of Anesthesia Complications Negative for: history of anesthetic complications  Airway Mallampati: I  TM Distance: >3 FB Neck ROM: Limited    Dental  (+) Loose, Dental Advisory Given, Missing,    Pulmonary Current Smoker,    Pulmonary exam normal breath sounds clear to auscultation       Cardiovascular negative cardio ROS Normal cardiovascular exam Rhythm:regular Rate:Normal     Neuro/Psych negative neurological ROS     GI/Hepatic Neg liver ROS, GERD  Controlled,(+)     substance abuse  alcohol use, Drinks two 40 ounce bottles of beer per day   Endo/Other  negative endocrine ROS  Renal/GU negative Renal ROS  negative genitourinary   Musculoskeletal negative musculoskeletal ROS (+) Arthritis ,   Abdominal Normal abdominal exam  (+)   Peds  Hematology negative hematology ROS (+)   Anesthesia Other Findings   Reproductive/Obstetrics                          Anesthesia Physical Anesthesia Plan  ASA: II and emergent  Anesthesia Plan: General   Post-op Pain Management:    Induction: Intravenous  Airway Management Planned: Oral ETT  Additional Equipment:   Intra-op Plan:   Post-operative Plan: Extubation in OR  Informed Consent: I have reviewed the patients History and Physical, chart, labs and discussed the procedure including the risks, benefits and alternatives for the proposed anesthesia with the patient or authorized representative who has indicated his/her understanding and acceptance.     Plan Discussed with: CRNA, Anesthesiologist and Surgeon  Anesthesia Plan Comments:        Anesthesia Quick Evaluation

## 2015-10-19 ENCOUNTER — Emergency Department (HOSPITAL_COMMUNITY): Payer: Medicaid Other

## 2015-10-19 ENCOUNTER — Encounter (HOSPITAL_COMMUNITY): Payer: Self-pay | Admitting: Orthopaedic Surgery

## 2015-10-19 DIAGNOSIS — Z23 Encounter for immunization: Secondary | ICD-10-CM | POA: Diagnosis not present

## 2015-10-19 DIAGNOSIS — Y92410 Unspecified street and highway as the place of occurrence of the external cause: Secondary | ICD-10-CM | POA: Diagnosis not present

## 2015-10-19 DIAGNOSIS — S82252C Displaced comminuted fracture of shaft of left tibia, initial encounter for open fracture type IIIA, IIIB, or IIIC: Secondary | ICD-10-CM | POA: Diagnosis present

## 2015-10-19 DIAGNOSIS — Z8781 Personal history of (healed) traumatic fracture: Secondary | ICD-10-CM

## 2015-10-19 DIAGNOSIS — S82202B Unspecified fracture of shaft of left tibia, initial encounter for open fracture type I or II: Secondary | ICD-10-CM | POA: Diagnosis not present

## 2015-10-19 DIAGNOSIS — F17200 Nicotine dependence, unspecified, uncomplicated: Secondary | ICD-10-CM | POA: Diagnosis present

## 2015-10-19 DIAGNOSIS — S82452B Displaced comminuted fracture of shaft of left fibula, initial encounter for open fracture type I or II: Secondary | ICD-10-CM | POA: Diagnosis present

## 2015-10-19 DIAGNOSIS — Z9889 Other specified postprocedural states: Secondary | ICD-10-CM

## 2015-10-19 DIAGNOSIS — D62 Acute posthemorrhagic anemia: Secondary | ICD-10-CM | POA: Diagnosis not present

## 2015-10-19 LAB — CBC
HEMATOCRIT: 37.4 % — AB (ref 39.0–52.0)
Hemoglobin: 12.8 g/dL — ABNORMAL LOW (ref 13.0–17.0)
MCH: 34.9 pg — ABNORMAL HIGH (ref 26.0–34.0)
MCHC: 34.2 g/dL (ref 30.0–36.0)
MCV: 101.9 fL — AB (ref 78.0–100.0)
Platelets: 232 10*3/uL (ref 150–400)
RBC: 3.67 MIL/uL — AB (ref 4.22–5.81)
RDW: 12.8 % (ref 11.5–15.5)
WBC: 10.9 10*3/uL — AB (ref 4.0–10.5)

## 2015-10-19 LAB — URINALYSIS, ROUTINE W REFLEX MICROSCOPIC
Bilirubin Urine: NEGATIVE
GLUCOSE, UA: NEGATIVE mg/dL
HGB URINE DIPSTICK: NEGATIVE
KETONES UR: 15 mg/dL — AB
LEUKOCYTES UA: NEGATIVE
Nitrite: NEGATIVE
PROTEIN: NEGATIVE mg/dL
Specific Gravity, Urine: 1.023 (ref 1.005–1.030)
pH: 6 (ref 5.0–8.0)

## 2015-10-19 LAB — SAMPLE TO BLOOD BANK

## 2015-10-19 MED ORDER — OXYCODONE HCL 5 MG PO TABS
ORAL_TABLET | ORAL | Status: AC
  Filled 2015-10-19: qty 1

## 2015-10-19 MED ORDER — POLYETHYLENE GLYCOL 3350 17 G PO PACK: 17.0000 g | PACK | Freq: Every day | ORAL | Status: DC | PRN

## 2015-10-19 MED ORDER — PNEUMOCOCCAL VAC POLYVALENT 25 MCG/0.5ML IJ INJ
0.5000 mL | INJECTION | INTRAMUSCULAR | Status: AC
  Administered 2015-10-20: 0.5 mL via INTRAMUSCULAR
  Filled 2015-10-19: qty 0.5
  Filled 2015-10-19: qty 1

## 2015-10-19 MED ORDER — ONDANSETRON HCL 4 MG/2ML IJ SOLN: 4.0000 mg | Freq: Four times a day (QID) | INTRAMUSCULAR | Status: DC | PRN

## 2015-10-19 MED ORDER — METOCLOPRAMIDE HCL 5 MG/ML IJ SOLN: 5.0000 mg | Freq: Three times a day (TID) | INTRAMUSCULAR | Status: DC | PRN

## 2015-10-19 MED ORDER — ACETAMINOPHEN 325 MG PO TABS: 650.0000 mg | ORAL_TABLET | Freq: Four times a day (QID) | ORAL | Status: DC | PRN

## 2015-10-19 MED ORDER — SODIUM CHLORIDE 0.9 % IV SOLN
INTRAVENOUS | Status: DC
  Administered 2015-10-19 – 2015-10-20 (×4): via INTRAVENOUS

## 2015-10-19 MED ORDER — ONDANSETRON HCL 4 MG PO TABS: 4.0000 mg | ORAL_TABLET | Freq: Four times a day (QID) | ORAL | Status: DC | PRN

## 2015-10-19 MED ORDER — CEFAZOLIN SODIUM-DEXTROSE 2-3 GM-% IV SOLR
2.0000 g | Freq: Four times a day (QID) | INTRAVENOUS | Status: AC
  Administered 2015-10-19 (×3): 2 g via INTRAVENOUS
  Filled 2015-10-19 (×4): qty 50

## 2015-10-19 MED ORDER — SORBITOL 70 % SOLN
30.0000 mL | Freq: Every day | Status: DC | PRN
  Filled 2015-10-19: qty 30

## 2015-10-19 MED ORDER — METHOCARBAMOL 1000 MG/10ML IJ SOLN
500.0000 mg | Freq: Four times a day (QID) | INTRAVENOUS | Status: DC | PRN
  Filled 2015-10-19: qty 5

## 2015-10-19 MED ORDER — INFLUENZA VAC SPLIT QUAD 0.5 ML IM SUSY
0.5000 mL | PREFILLED_SYRINGE | INTRAMUSCULAR | Status: AC
  Administered 2015-10-20: 0.5 mL via INTRAMUSCULAR
  Filled 2015-10-19: qty 0.5

## 2015-10-19 MED ORDER — MORPHINE SULFATE (PF) 2 MG/ML IV SOLN
1.0000 mg | INTRAVENOUS | Status: DC | PRN
  Administered 2015-10-19 – 2015-10-20 (×4): 1 mg via INTRAVENOUS
  Filled 2015-10-19 (×4): qty 1

## 2015-10-19 MED ORDER — METOCLOPRAMIDE HCL 10 MG PO TABS: 5.0000 mg | ORAL_TABLET | Freq: Three times a day (TID) | ORAL | Status: DC | PRN

## 2015-10-19 MED ORDER — DIPHENHYDRAMINE HCL 12.5 MG/5ML PO ELIX
25.0000 mg | ORAL_SOLUTION | ORAL | Status: DC | PRN
  Filled 2015-10-19: qty 10

## 2015-10-19 MED ORDER — ACETAMINOPHEN 650 MG RE SUPP: 650.0000 mg | Freq: Four times a day (QID) | RECTAL | Status: DC | PRN

## 2015-10-19 MED ORDER — ENOXAPARIN SODIUM 40 MG/0.4ML ~~LOC~~ SOLN
40.0000 mg | SUBCUTANEOUS | Status: DC
  Administered 2015-10-19 – 2015-10-21 (×3): 40 mg via SUBCUTANEOUS
  Filled 2015-10-19 (×3): qty 0.4

## 2015-10-19 MED ORDER — MAGNESIUM CITRATE PO SOLN: 1.0000 | Freq: Once | ORAL | Status: DC | PRN

## 2015-10-19 MED ORDER — OXYCODONE HCL 5 MG PO TABS
5.0000 mg | ORAL_TABLET | ORAL | Status: DC | PRN
  Administered 2015-10-20 – 2015-10-21 (×2): 10 mg via ORAL
  Filled 2015-10-19 (×2): qty 2

## 2015-10-19 MED ORDER — OXYCODONE HCL ER 10 MG PO T12A
10.0000 mg | EXTENDED_RELEASE_TABLET | Freq: Two times a day (BID) | ORAL | Status: DC
  Administered 2015-10-19 – 2015-10-21 (×6): 10 mg via ORAL
  Filled 2015-10-19 (×6): qty 1

## 2015-10-19 MED ORDER — HYDROMORPHONE HCL 1 MG/ML IJ SOLN
INTRAMUSCULAR | Status: AC
  Filled 2015-10-19: qty 1

## 2015-10-19 MED ORDER — METHOCARBAMOL 500 MG PO TABS
500.0000 mg | ORAL_TABLET | Freq: Four times a day (QID) | ORAL | Status: DC | PRN
  Administered 2015-10-19: 500 mg via ORAL
  Filled 2015-10-19 (×2): qty 1

## 2015-10-19 NOTE — Progress Notes (Signed)
   Subjective:  Patient reports pain as moderate.    Objective:   VITALS:   Filed Vitals:   10/19/15 0317 10/19/15 0345 10/19/15 0641 10/19/15 0952  BP: 118/71  108/58 110/62  Pulse: 63  60 58  Temp: 97.8 F (36.6 C)  98.4 F (36.9 C) 98.6 F (37 C)  TempSrc: Axillary  Oral Oral  Resp: 16  15 16   Height:  5\' 6"  (1.676 m)    Weight:  68.04 kg (150 lb)    SpO2: 100%  99% 100%    Neurologically intact ABD soft Neurovascular intact Sensation intact distally Intact pulses distally Dorsiflexion/Plantar flexion intact Incision: dressing C/D/I and no drainage No cellulitis present Compartment soft   Lab Results  Component Value Date   WBC 10.9* 10/19/2015   HGB 12.8* 10/19/2015   HCT 37.4* 10/19/2015   MCV 101.9* 10/19/2015   PLT 232 10/19/2015     Assessment/Plan:  1 Day Post-Op   - Expected postop acute blood loss anemia - will monitor for symptoms - Up with PT/OT - HHPT - DVT ppx - SCDs, ambulation, lovenox - NWB operative extremity - Pain control - Discharge planning - home tomorrow  Cheral AlmasXu, Cody [name] 10/19/2015, 2:07 PM [phone]
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
## 2015-10-19 NOTE — Progress Notes (Signed)
Pt did not received his scheduled oxy ir and delaude at 0030 because it was given by OR nurse
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
## 2015-10-19 NOTE — Progress Notes (Signed)
Orthopedic Tech Progress Note Patient Details:  Cody [name] 02/10/1961 [id number]  Ortho Devices Type of Ortho Device: CAM walker Ortho Device/Splint Location: lle Ortho Device/Splint Interventions: Application Trapeze bar patient helper  [name], [name] 10/19/2015, 10:09 AM

## 2015-10-19 NOTE — ED Notes (Signed)
Four personal belonging bags were placed in the Pod D dirty utility room bins (bins used for Pod C patients). Pt's four bags were placed in bin C25 and bin was labeled with pt's name. Informed Marisela - RN.

## 2015-10-19 NOTE — Transfer of Care (Signed)
Immediate Anesthesia Transfer of Care Note  Patient: Cody [name]  Procedure(s) Performed: Procedure(s): IRRIGATION AND DEBRIDEMENT EXTREMITY (Left) INTRAMEDULLARY (IM) NAIL TIBIAL (Left)  Patient Location: PACU  Anesthesia Type:General  Level of Consciousness: awake, alert  and patient cooperative  Airway & Oxygen Therapy: Patient Spontanous Breathing  Post-op Assessment: Report given to RN and Post -op Vital signs reviewed and stable  Post vital signs: Reviewed and stable  Last Vitals:  Filed Vitals:   10/18/15 2100 10/18/15 2359  BP: 182/92   Pulse: 64 74  Temp:  36.1 C  Resp: 12 15    Complications: No apparent anesthesia complications

## 2015-10-19 NOTE — Anesthesia Postprocedure Evaluation (Signed)
Anesthesia Post Note  Patient: Cody [name]  Procedure(s) Performed: Procedure(s) (LRB): IRRIGATION AND DEBRIDEMENT EXTREMITY (Left) INTRAMEDULLARY (IM) NAIL TIBIAL (Left)  Patient location during evaluation: PACU Anesthesia Type: General Level of consciousness: awake and alert and patient cooperative Pain management: pain level controlled Vital Signs Assessment: post-procedure vital signs reviewed and stable Respiratory status: spontaneous breathing and respiratory function stable Cardiovascular status: stable Anesthetic complications: no    Last Vitals:  Filed Vitals:   10/19/15 0205 10/19/15 0317  BP: 132/74 118/71  Pulse: 67 63  Temp: 37.2 C 36.6 C  Resp: 17 16    Last Pain:  Filed Vitals:   10/19/15 0518  PainSc: Asleep                 Samyra Limb S
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
## 2015-10-20 NOTE — Progress Notes (Signed)
Nurse in room talking with pt about d/c today. Pt stated, "I'm still in a lot of pain and it's too early for me to go home. I want to stay another night". Nurse paged Dr Roda ShuttersXu, who ordered to cancel d/c today.
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
## 2015-10-20 NOTE — Progress Notes (Signed)
   Subjective:  Patient reports pain as moderate.    Objective:   VITALS:   Filed Vitals:   10/19/15 2003 10/20/15 0206 10/20/15 0233 10/20/15 0608  BP: 140/101 68/34 110/47 113/56  Pulse: 59 57 54 63  Temp: 99.6 F (37.6 C) 99.5 F (37.5 C)  99.1 F (37.3 C)  TempSrc: Oral Oral  Oral  Resp: 18 16  16   Height:      Weight:      SpO2: 97% 96%  95%    Neurologically intact ABD soft Neurovascular intact Sensation intact distally Intact pulses distally Dorsiflexion/Plantar flexion intact Incision: dressing C/D/I and no drainage No cellulitis present Compartment soft   Lab Results  Component Value Date   WBC 10.9* 10/19/2015   HGB 12.8* 10/19/2015   HCT 37.4* 10/19/2015   MCV 101.9* 10/19/2015   PLT 232 10/19/2015     Assessment/Plan:  2 Days Post-Op   - Expected postop acute blood loss anemia - will monitor for symptoms - Up with PT/OT - HHPT - DVT ppx - SCDs, ambulation, lovenox - NWB operative extremity - Pain controlled - Discharge planning - home today  Cheral AlmasXu, Naiping Michael 10/20/2015, 7:43 AM [phone]
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
## 2015-10-21 MED ORDER — IOHEXOL 300 MG/ML  SOLN
100.0000 mL | Freq: Once | INTRAMUSCULAR | Status: AC | PRN
  Administered 2015-10-18: 100 mL via INTRAVENOUS

## 2015-10-21 NOTE — Progress Notes (Signed)
   Subjective:  Patient reports pain as moderate.    Objective:   VITALS:   Filed Vitals:   10/20/15 1427 10/20/15 2130 10/21/15 0457 10/21/15 1513  BP: 116/61 119/67 113/59 116/63  Pulse: 71 84 71 86  Temp: 97.5 F (36.4 C) 99.5 F (37.5 C) 99.1 F (37.3 C) 99.2 F (37.3 C)  TempSrc: Oral Oral Oral Oral  Resp: 19 18 16 18   Height:      Weight:      SpO2: 95% 94% 94% 96%    Neurologically intact ABD soft Neurovascular intact Sensation intact distally Intact pulses distally Dorsiflexion/Plantar flexion intact Incision: dressing C/D/I and no drainage No cellulitis present Compartment soft   Lab Results  Component Value Date   WBC 10.9* 10/19/2015   HGB 12.8* 10/19/2015   HCT 37.4* 10/19/2015   MCV 101.9* 10/19/2015   PLT 232 10/19/2015     Assessment/Plan:  3 Days Post-Op   - Expected postop acute blood loss anemia - will monitor for symptoms - Up with PT/OT - HHPT - DVT ppx - SCDs, ambulation, lovenox - NWB operative extremity - Pain better controlled - Discharge planning - home today  Cheral AlmasXu, Naiping Michael 10/21/2015, 3:17 PM [phone]
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
## 2015-10-21 NOTE — Progress Notes (Signed)
Pt d/c home via PTAR with belongings and home health equipment. VItal signs stable. IV discontinued-catheter intact upon removal. No distress noted upon discharge.

## 2015-10-24 ENCOUNTER — Encounter: Payer: Self-pay | Admitting: Internal Medicine

## 2016-05-18 ENCOUNTER — Encounter: Payer: Self-pay | Admitting: Gastroenterology

## 2016-05-18 ENCOUNTER — Ambulatory Visit: Payer: Medicaid Other | Attending: Family Medicine | Admitting: Family Medicine

## 2016-05-18 ENCOUNTER — Encounter: Payer: Self-pay | Admitting: Family Medicine

## 2016-05-18 VITALS — BP 110/74 | HR 60 | Temp 97.6°F | Ht 71.0 in | Wt 178.2 lb

## 2016-05-18 DIAGNOSIS — R12 Heartburn: Secondary | ICD-10-CM | POA: Diagnosis present

## 2016-05-18 DIAGNOSIS — Z7982 Long term (current) use of aspirin: Secondary | ICD-10-CM | POA: Insufficient documentation

## 2016-05-18 DIAGNOSIS — F17210 Nicotine dependence, cigarettes, uncomplicated: Secondary | ICD-10-CM | POA: Insufficient documentation

## 2016-05-18 DIAGNOSIS — K219 Gastro-esophageal reflux disease without esophagitis: Secondary | ICD-10-CM | POA: Diagnosis not present

## 2016-05-18 DIAGNOSIS — Z0000 Encounter for general adult medical examination without abnormal findings: Secondary | ICD-10-CM

## 2016-05-18 DIAGNOSIS — K210 Gastro-esophageal reflux disease with esophagitis: Secondary | ICD-10-CM | POA: Diagnosis not present

## 2016-05-18 MED ORDER — OMEPRAZOLE 20 MG PO CPDR: 20.0000 mg | DELAYED_RELEASE_CAPSULE | Freq: Every day | ORAL | 3 refills | Status: DC

## 2016-05-18 NOTE — Patient Instructions (Addendum)
Cody HuhDanny was seen today for heartburn.  Diagnoses and all orders for this visit:  Healthcare maintenance -     Ambulatory referral to Gastroenterology  Gastroesophageal reflux disease, esophagitis presence not specified -     omeprazole (PRILOSEC) 20 MG capsule; Take 1 capsule (20 mg total) by mouth daily.   F/u in 6 weeks for GERD   Dr. Armen PickupFunches   Food Choices for Gastroesophageal Reflux Disease, Adult When you have gastroesophageal reflux disease (GERD), the foods you eat and your eating habits are very important. Choosing the right foods can help ease the discomfort of GERD. WHAT GENERAL GUIDELINES DO I NEED TO FOLLOW?  Choose fruits, vegetables, whole grains, low-fat dairy products, and low-fat meat, fish, and poultry.  Limit fats such as oils, salad dressings, butter, nuts, and avocado.  Keep a food diary to identify foods that cause symptoms.  Avoid foods that cause reflux. These may be different for different people.  Eat frequent small meals instead of three large meals each day.  Eat your meals slowly, in a relaxed setting.  Limit fried foods.  Cook foods using methods other than frying.  Avoid drinking alcohol.  Avoid drinking large amounts of liquids with your meals.  Avoid bending over or lying down until 2-3 hours after eating. WHAT FOODS ARE NOT RECOMMENDED? The following are some foods and drinks that may worsen your symptoms: Vegetables Tomatoes. Tomato juice. Tomato and spaghetti sauce. Chili peppers. Onion and garlic. Horseradish. Fruits Oranges, grapefruit, and lemon (fruit and juice). Meats High-fat meats, fish, and poultry. This includes hot dogs, ribs, ham, sausage, salami, and bacon. Dairy Whole milk and chocolate milk. Sour cream. Cream. Butter. Ice cream. Cream cheese.  Beverages Coffee and tea, with or without caffeine. Carbonated beverages or energy drinks. Condiments Hot sauce. Barbecue sauce.  Sweets/Desserts Chocolate and cocoa. Donuts.  Peppermint and spearmint. Fats and Oils High-fat foods, including [place] fries and potato chips. Other Vinegar. Strong spices, such as black pepper, white pepper, red pepper, cayenne, curry powder, cloves, ginger, and chili powder. The items listed above may not be a complete list of foods and beverages to avoid. Contact your dietitian for more information.   This information is not intended to replace advice given to you by your health care provider. Make sure you discuss any questions you have with your health care provider.   Document Released: 07/30/2005 Document Revised: 08/20/2014 Document Reviewed: 06/03/2013 Elsevier Interactive Patient Education [employer]2016 Elsevier Inc.

## 2016-05-18 NOTE — Progress Notes (Signed)
Pt want to set up a colonoscopy.  Pt declined flu shot.

## 2016-05-18 NOTE — Assessment & Plan Note (Signed)
A: GERD P: Start prilosec Advised avoidance of ETOH and smoking Gave GERD diet

## 2016-05-18 NOTE — Progress Notes (Signed)
Subjective:  Patient ID: Cody [name], male    DOB: 31-Jul-1961  Age: 55 y.o. MRN: [id number]  CC: Heartburn   HPI Hicks L Lenker presents for   1. GERD: has hx of GERD. He drinks and smokes 1 time per week. He has tried Prilosec in the past which has helped with GERD symptoms. He denies fever, chills, weight loss and blood in stool.   2. Colonoscopy: amenable to screening c-scope. He has never had one. He denies blood in stool.   Social History  Substance Use Topics  . Smoking status: Current Every Day Smoker    Packs/day: 0.50    Years: 20.00    Types: Cigarettes  . Smokeless tobacco: Never Used  . Alcohol use 1.8 oz/week    3 Shots of liquor per week     Comment: 1 pint of liquor per week    Outpatient Medications Prior to Visit  Medication Sig Dispense Refill  . acetaminophen (TYLENOL) 500 MG tablet Take 1,500 mg by mouth every 6 (six) hours as needed for pain.    Marland Kitchen aspirin EC 325 MG tablet Take 1 tablet (325 mg total) by mouth 2 (two) times daily. 84 tablet 0  . gabapentin (NEURONTIN) 300 MG capsule Take 300 mg by mouth 3 (three) times daily.    . methocarbamol (ROBAXIN) 750 MG tablet Take 1 tablet (750 mg total) by mouth 2 (two) times daily as needed for muscle spasms. 60 tablet 0  . omeprazole (PRILOSEC) 20 MG capsule Take 1 capsule (20 mg total) by mouth daily. 30 capsule 3  . traMADol (ULTRAM) 50 MG tablet Take 50 mg by mouth every 6 (six) hours as needed for moderate pain.    . naproxen (NAPROSYN) 500 MG tablet Take 1 tablet (500 mg total) by mouth 2 (two) times daily with a meal. (Patient not taking: Reported on 05/18/2016) 30 tablet 2  . ondansetron (ZOFRAN) 4 MG tablet Take 1-2 tablets (4-8 mg total) by mouth every 8 (eight) hours as needed for nausea or vomiting. (Patient not taking: Reported on 05/18/2016) 40 tablet 0  . oxyCODONE (OXYCONTIN) 10 mg 12 hr tablet Take 1 tablet (10 mg total) by mouth every 12 (twelve) hours. (Patient not taking: Reported on 05/18/2016)  10 tablet 0  . oxyCODONE-acetaminophen (PERCOCET) 5-325 MG tablet Take 1-2 tablets by mouth every 4 (four) hours as needed for severe pain. (Patient not taking: Reported on 05/18/2016) 90 tablet 0  . senna-docusate (SENOKOT S) 8.6-50 MG tablet Take 1 tablet by mouth at bedtime as needed. (Patient not taking: Reported on 05/18/2016) 30 tablet 1   No facility-administered medications prior to visit.     ROS Review of Systems  Constitutional: Negative for chills, fatigue, fever and unexpected weight change.  Eyes: Negative for visual disturbance.  Respiratory: Negative for cough and shortness of breath.   Cardiovascular: Negative for chest pain, palpitations and leg swelling.  Gastrointestinal: Negative for abdominal pain, blood in stool, constipation, diarrhea, nausea and vomiting.  Endocrine: Negative for polydipsia, polyphagia and polyuria.  Musculoskeletal: Negative for arthralgias, back pain, gait problem, myalgias and neck pain.  Skin: Negative for rash.  Allergic/Immunologic: Negative for immunocompromised state.  Hematological: Negative for adenopathy. Does not bruise/bleed easily.  Psychiatric/Behavioral: Negative for dysphoric mood, sleep disturbance and suicidal ideas. The patient is not nervous/anxious.     Objective:  BP 110/74 (BP Location: Left Arm, Patient Position: Sitting, Cuff Size: Small)   Pulse 60   Temp 97.6 F (36.4 C) (  Oral)   Ht 5\' 11"  (1.803 m)   Wt 178 lb 3.2 oz (80.8 kg)   SpO2 97%   BMI 24.85 kg/m   BP/Weight 05/18/2016 10/21/2015 10/19/2015  Systolic BP 110 97 -  Diastolic BP 74 46 -  Wt. (Lbs) 178.2 - 150  BMI 24.85 - 24.22   Wt Readings from Last 3 Encounters:  05/18/16 178 lb 3.2 oz (80.8 kg)  10/19/15 150 lb (68 kg)  12/03/14 177 lb 3.2 oz (80.4 kg)    Physical Exam  Constitutional: He appears well-developed and well-nourished. No distress.  HENT:  Head: Normocephalic and atraumatic.  Neck: Normal range of motion. Neck supple.    Cardiovascular: Normal rate, regular rhythm, normal heart sounds and intact distal pulses.   Pulmonary/Chest: Effort normal and breath sounds normal.  Abdominal: Soft. Bowel sounds are normal. He exhibits no distension and no mass. There is no tenderness. There is no rebound and no guarding.  Musculoskeletal: He exhibits no edema.  Neurological: He is alert.  Skin: Skin is warm and dry. No rash noted. No erythema.  Psychiatric: He has a normal mood and affect.    Assessment & Plan:  Dannielle HuhDanny was seen today for heartburn.  Diagnoses and all orders for this visit:  Healthcare maintenance -     Ambulatory referral to Gastroenterology  Gastroesophageal reflux disease, esophagitis presence not specified -     omeprazole (PRILOSEC) 20 MG capsule; Take 1 capsule (20 mg total) by mouth daily.   There are no diagnoses linked to this encounter.  No orders of the defined types were placed in this encounter.   Follow-up: No Follow-up on file.

## 2016-07-09 ENCOUNTER — Ambulatory Visit (AMBULATORY_SURGERY_CENTER): Payer: Self-pay | Admitting: *Deleted

## 2016-07-09 ENCOUNTER — Telehealth: Payer: Self-pay | Admitting: *Deleted

## 2016-07-09 VITALS — Ht 71.0 in | Wt 183.0 lb

## 2016-07-09 DIAGNOSIS — Z1211 Encounter for screening for malignant neoplasm of colon: Secondary | ICD-10-CM

## 2016-07-09 MED ORDER — NA SULFATE-K SULFATE-MG SULF 17.5-3.13-1.6 GM/177ML PO SOLN: 1.0000 | Freq: Once | ORAL | 0 refills | Status: AC

## 2016-07-09 NOTE — Telephone Encounter (Signed)
During pre-visit today 07/09/16 pt admitted to using cocaine before Thanksgiving, pt is scheduled for colonoscopy on 07/23/16, is pt ok to proceed with procedure, pls adv-adm

## 2016-07-09 NOTE — Progress Notes (Signed)
No egg or soy allergy. No anesthesia problems.  No home O2.  No diet meds.  No emmi.  

## 2016-07-10 NOTE — Telephone Encounter (Signed)
As long as he has recovered and did not have a cardiovascular event such as an MI, he should be okay. Thanks

## 2016-07-11 NOTE — Telephone Encounter (Signed)
Spoke with patient. Explained to him Dr.Armbruster's concerns. Patient denies any heart problems or heart symptoms. Explained to patient not to use any drugs near colonoscopy date or they will cancel procedure. Patient verbalized understanding.

## 2016-07-23 ENCOUNTER — Encounter: Payer: Medicaid Other | Admitting: Gastroenterology

## 2016-07-23 ENCOUNTER — Telehealth: Payer: Self-pay | Admitting: Gastroenterology

## 2016-07-23 NOTE — Telephone Encounter (Signed)
Dr. Adela LankArmbruster, Pt did call the answer service over the weekend to cancel his appointment.  Would you like him charged the cancellation fee?  Thanks!

## 2016-07-23 NOTE — Telephone Encounter (Signed)
Thanks for letting me know!

## 2016-07-23 NOTE — Telephone Encounter (Signed)
No that's okay. Thanks 

## 2016-08-27 ENCOUNTER — Ambulatory Visit (INDEPENDENT_AMBULATORY_CARE_PROVIDER_SITE_OTHER): Payer: Self-pay | Admitting: Orthopaedic Surgery

## 2016-08-30 ENCOUNTER — Ambulatory Visit (INDEPENDENT_AMBULATORY_CARE_PROVIDER_SITE_OTHER): Payer: Self-pay | Admitting: Orthopaedic Surgery

## 2016-09-10 ENCOUNTER — Encounter (INDEPENDENT_AMBULATORY_CARE_PROVIDER_SITE_OTHER): Payer: Self-pay | Admitting: Orthopaedic Surgery

## 2016-09-10 ENCOUNTER — Ambulatory Visit (INDEPENDENT_AMBULATORY_CARE_PROVIDER_SITE_OTHER): Payer: Medicaid Other

## 2016-09-10 ENCOUNTER — Ambulatory Visit (INDEPENDENT_AMBULATORY_CARE_PROVIDER_SITE_OTHER): Payer: Medicaid Other | Admitting: Orthopaedic Surgery

## 2016-09-10 DIAGNOSIS — S82202F Unspecified fracture of shaft of left tibia, subsequent encounter for open fracture type IIIA, IIIB, or IIIC with routine healing: Secondary | ICD-10-CM

## 2016-09-10 DIAGNOSIS — S82402F Unspecified fracture of shaft of left fibula, subsequent encounter for open fracture type IIIA, IIIB, or IIIC with routine healing: Secondary | ICD-10-CM | POA: Diagnosis not present

## 2016-09-10 MED ORDER — TRAMADOL HCL 50 MG PO TABS: 50.0000 mg | ORAL_TABLET | Freq: Four times a day (QID) | ORAL | 0 refills | Status: DC | PRN
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
## 2016-09-18 ENCOUNTER — Ambulatory Visit: Payer: Medicaid Other | Admitting: Family Medicine

## 2016-09-27 ENCOUNTER — Ambulatory Visit: Payer: Medicaid Other | Attending: Family Medicine | Admitting: Family Medicine

## 2016-09-27 ENCOUNTER — Encounter: Payer: Self-pay | Admitting: Family Medicine

## 2016-09-27 VITALS — BP 134/75 | HR 99 | Temp 98.6°F | Ht 71.0 in | Wt 178.2 lb

## 2016-09-27 DIAGNOSIS — Z79899 Other long term (current) drug therapy: Secondary | ICD-10-CM | POA: Diagnosis not present

## 2016-09-27 DIAGNOSIS — K219 Gastro-esophageal reflux disease without esophagitis: Secondary | ICD-10-CM | POA: Diagnosis not present

## 2016-09-27 DIAGNOSIS — Z114 Encounter for screening for human immunodeficiency virus [HIV]: Secondary | ICD-10-CM | POA: Diagnosis not present

## 2016-09-27 DIAGNOSIS — M5412 Radiculopathy, cervical region: Secondary | ICD-10-CM | POA: Diagnosis not present

## 2016-09-27 DIAGNOSIS — F1020 Alcohol dependence, uncomplicated: Secondary | ICD-10-CM | POA: Diagnosis not present

## 2016-09-27 DIAGNOSIS — Z7982 Long term (current) use of aspirin: Secondary | ICD-10-CM | POA: Insufficient documentation

## 2016-09-27 DIAGNOSIS — Z0001 Encounter for general adult medical examination with abnormal findings: Secondary | ICD-10-CM | POA: Insufficient documentation

## 2016-09-27 DIAGNOSIS — Z1159 Encounter for screening for other viral diseases: Secondary | ICD-10-CM

## 2016-09-27 DIAGNOSIS — F17210 Nicotine dependence, cigarettes, uncomplicated: Secondary | ICD-10-CM | POA: Insufficient documentation

## 2016-09-27 LAB — HIV ANTIBODY (ROUTINE TESTING W REFLEX): HIV 1&2 Ab, 4th Generation: NONREACTIVE

## 2016-09-27 LAB — COMPLETE METABOLIC PANEL WITH GFR
ALT: 10 U/L (ref 9–46)
AST: 15 U/L (ref 10–35)
Albumin: 4.6 g/dL (ref 3.6–5.1)
Alkaline Phosphatase: 72 U/L (ref 40–115)
BUN: 13 mg/dL (ref 7–25)
CHLORIDE: 104 mmol/L (ref 98–110)
CO2: 25 mmol/L (ref 20–31)
Calcium: 9.8 mg/dL (ref 8.6–10.3)
Creat: 1.14 mg/dL (ref 0.70–1.33)
GFR, EST AFRICAN AMERICAN: 82 mL/min (ref >=60)
GFR, EST NON AFRICAN AMERICAN: 71 mL/min (ref >=60)
Glucose, Bld: 131 mg/dL — ABNORMAL HIGH (ref 65–99)
Potassium: 4.3 mmol/L (ref 3.5–5.3)
Sodium: 141 mmol/L (ref 135–146)
Total Bilirubin: 0.4 mg/dL (ref 0.2–1.2)
Total Protein: 8.1 g/dL (ref 6.1–8.1)

## 2016-09-27 LAB — CBC
HEMATOCRIT: 43.3 % (ref 38.5–50.0)
Hemoglobin: 14.6 g/dL (ref 13.2–17.1)
MCH: 34.3 pg — ABNORMAL HIGH (ref 27.0–33.0)
MCHC: 33.7 g/dL (ref 32.0–36.0)
MCV: 101.6 fL — AB (ref 80.0–100.0)
MPV: 10 fL (ref 7.5–12.5)
PLATELETS: 301 10*3/uL (ref 140–400)
RBC: 4.26 MIL/uL (ref 4.20–5.80)
RDW: 12.9 % (ref 11.0–15.0)
WBC: 8.2 10*3/uL (ref 3.8–10.8)

## 2016-09-27 MED ORDER — THIAMINE HCL 100 MG PO TABS: 100.0000 mg | ORAL_TABLET | Freq: Every day | ORAL | 11 refills | Status: DC

## 2016-09-27 MED ORDER — MULTIVITAMIN ADULT PO TABS: 1.0000 | ORAL_TABLET | Freq: Every day | ORAL | 11 refills | Status: DC

## 2016-09-27 MED ORDER — GABAPENTIN 300 MG PO CAPS: 300.0000 mg | ORAL_CAPSULE | Freq: Three times a day (TID) | ORAL | 5 refills | Status: DC

## 2016-09-27 MED ORDER — IBUPROFEN 600 MG PO TABS: 600.0000 mg | ORAL_TABLET | Freq: Every day | ORAL | 5 refills | Status: DC

## 2016-09-27 MED ORDER — OMEPRAZOLE 20 MG PO CPDR: 20.0000 mg | DELAYED_RELEASE_CAPSULE | Freq: Two times a day (BID) | ORAL | 5 refills | Status: DC

## 2016-09-27 NOTE — Progress Notes (Signed)
Pt is here today for nerve pain.

## 2016-09-27 NOTE — Assessment & Plan Note (Signed)
Patient reports symptoms of cervical radicular pain Plan: Restart gabapentin Ibuprofen nightly with food and PPI

## 2016-09-27 NOTE — Assessment & Plan Note (Signed)
Patient with daily alcohol dependence Plan: Reduce alcohol intake Add multivitamin and thiamine Add PPI

## 2016-09-27 NOTE — Patient Instructions (Addendum)
Dannielle HuhDanny was seen today for neck pain.  Diagnoses and all orders for this visit:  Cervical radicular pain -     gabapentin (NEURONTIN) 300 MG capsule; Take 1 capsule (300 mg total) by mouth 3 (three) times daily. -     ibuprofen (ADVIL,MOTRIN) 600 MG tablet; Take 1 tablet (600 mg total) by mouth daily with supper.  Gastroesophageal reflux disease, esophagitis presence not specified -     omeprazole (PRILOSEC) 20 MG capsule; Take 1 capsule (20 mg total) by mouth 2 (two) times daily before a meal.  Uncomplicated alcohol dependence (HCC) -     COMPLETE METABOLIC PANEL WITH GFR -     CBC -     Multiple Vitamins-Minerals (MULTIVITAMIN ADULT) TABS; Take 1 tablet by mouth daily. -     thiamine 100 MG tablet; Take 1 tablet (100 mg total) by mouth daily.  Encounter for screening for HIV -     HIV antibody (with reflex)  Need for hepatitis C screening test -     Hepatitis C antibody, reflex   Restart gabapentin, first take 300 mg at bedtime for one week, then twice daily for one week, then three times daily  F/u in 8 weeks for neck and shoulder pains   Dr. Armen PickupFunches

## 2016-09-27 NOTE — Progress Notes (Signed)
Subjective:  Patient ID: Cody [name], male    DOB: 1961-06-29  Age: 56 y.o. MRN: [id number]  CC: Neck Pain   HPI Cody [name] presents for    1.  Pain in both shoulders: started in March 2017. He has nerve conduction testing done. He reports nerve damage. He also reports neck pain. He tried gabapentin in the past which helped.   2. Alcohol dependence: he admits to drinking 2, 40 oz beers nightly. He denies GI upset. Denies bleeding. Not taking PPI, multivitamin or thiamine.    Social History  Substance Use Topics  . Smoking status: Current Every Day Smoker    Packs/day: 0.50    Years: 20.00    Types: Cigarettes  . Smokeless tobacco: Never Used  . Alcohol use 1.8 oz/week    3 Shots of liquor per week     Comment: 1 pint of liquor per week    Outpatient Medications Prior to Visit  Medication Sig Dispense Refill  . gabapentin (NEURONTIN) 300 MG capsule Take 300 mg by mouth 3 (three) times daily.    Marland Kitchen acetaminophen (TYLENOL) 500 MG tablet Take 1,500 mg by mouth every 6 (six) hours as needed for pain.    Marland Kitchen aspirin EC 325 MG tablet Take 1 tablet (325 mg total) by mouth 2 (two) times daily. (Patient not taking: Reported on 09/10/2016) 84 tablet 0  . methocarbamol (ROBAXIN) 750 MG tablet Take 1 tablet (750 mg total) by mouth 2 (two) times daily as needed for muscle spasms. (Patient not taking: Reported on 09/10/2016) 60 tablet 0  . omeprazole (PRILOSEC) 20 MG capsule Take 1 capsule (20 mg total) by mouth daily. (Patient not taking: Reported on 09/10/2016) 90 capsule 3  . traMADol (ULTRAM) 50 MG tablet Take 1 tablet (50 mg total) by mouth every 6 (six) hours as needed for moderate pain. (Patient not taking: Reported on 09/27/2016) 30 tablet 0   No facility-administered medications prior to visit.     ROS Review of Systems  Constitutional: Negative for chills, fatigue, fever and unexpected weight change.  Eyes: Negative for visual disturbance.  Respiratory: Negative for cough and  shortness of breath.   Cardiovascular: Negative for chest pain, palpitations and leg swelling.  Gastrointestinal: Negative for abdominal pain, blood in stool, constipation, diarrhea, nausea and vomiting.  Endocrine: Negative for polydipsia, polyphagia and polyuria.  Musculoskeletal: Positive for arthralgias, myalgias and neck pain. Negative for back pain and gait problem.  Skin: Negative for rash.  Allergic/Immunologic: Negative for immunocompromised state.  Hematological: Negative for adenopathy. Does not bruise/bleed easily.  Psychiatric/Behavioral: Negative for dysphoric mood, sleep disturbance and suicidal ideas. The patient is not nervous/anxious.     Objective:  BP 134/75 (BP Location: Left Arm, Patient Position: Sitting, Cuff Size: Small)   Pulse 99   Temp 98.6 F (37 C) (Oral)   Ht 5\' 11"  (1.803 m)   Wt 178 lb 3.2 oz (80.8 kg)   SpO2 96%   BMI 24.85 kg/m   BP/Weight 09/27/2016 07/09/2016 05/18/2016  Systolic BP 134 - 110  Diastolic BP 75 - 74  Wt. (Lbs) 178.2 183 178.2  BMI 24.85 25.52 24.85   Physical Exam  Constitutional: He appears well-developed and well-nourished. No distress.  HENT:  Head: Normocephalic and atraumatic.  Neck: Normal range of motion. Neck supple. Muscular tenderness present. No spinous process tenderness present. No neck rigidity. No edema, no erythema and normal range of motion present.  Negative Spurling b/l Normal hand strength b/l  Cardiovascular: Normal rate, regular rhythm, normal heart sounds and intact distal pulses.   Pulmonary/Chest: Effort normal and breath sounds normal.  Musculoskeletal: He exhibits no edema.  Neurological: He is alert.  Skin: Skin is warm and dry. No rash noted. No erythema.  Psychiatric: He has a normal mood and affect.     Assessment & Plan:  Cody [name] was seen today for neck pain.  Diagnoses and all orders for this visit:  Cervical radicular pain -     gabapentin (NEURONTIN) 300 MG capsule; Take 1 capsule  (300 mg total) by mouth 3 (three) times daily. -     ibuprofen (ADVIL,MOTRIN) 600 MG tablet; Take 1 tablet (600 mg total) by mouth daily with supper.  Gastroesophageal reflux disease, esophagitis presence not specified -     omeprazole (PRILOSEC) 20 MG capsule; Take 1 capsule (20 mg total) by mouth 2 (two) times daily before a meal.  Uncomplicated alcohol dependence (HCC) -     COMPLETE METABOLIC PANEL WITH GFR -     CBC -     Multiple Vitamins-Minerals (MULTIVITAMIN ADULT) TABS; Take 1 tablet by mouth daily. -     thiamine 100 MG tablet; Take 1 tablet (100 mg total) by mouth daily.  Encounter for screening for HIV -     HIV antibody (with reflex)  Need for hepatitis C screening test -     Hepatitis C antibody, reflex   There are no diagnoses linked to this encounter.  No orders of the defined types were placed in this encounter.   Follow-up: Return in about 8 weeks (around 11/22/2016) for cervical neck pain .   Dessa PhiJosalyn Urie Loughner MD

## 2016-09-28 ENCOUNTER — Telehealth: Payer: Self-pay

## 2016-09-28 LAB — HEPATITIS C ANTIBODY: HCV Ab: NEGATIVE

## 2016-09-28 NOTE — Telephone Encounter (Signed)
Pt was called and a VM was left informing pt of lab results. 

## 2016-11-22 ENCOUNTER — Ambulatory Visit
Admission: RE | Admit: 2016-11-22 | Discharge: 2016-11-22 | Disposition: A | Discharge status: Home / Self Care | Payer: Medicaid Other | Source: Ambulatory Visit | Attending: Family Medicine | Admitting: Family Medicine

## 2016-11-22 ENCOUNTER — Ambulatory Visit: Payer: Medicaid Other | Attending: Family Medicine | Admitting: Family Medicine

## 2016-11-22 ENCOUNTER — Encounter: Payer: Self-pay | Admitting: Family Medicine

## 2016-11-22 VITALS — BP 110/69 | HR 68 | Temp 97.9°F | Ht 71.0 in | Wt 181.8 lb

## 2016-11-22 DIAGNOSIS — G8929 Other chronic pain: Secondary | ICD-10-CM | POA: Diagnosis not present

## 2016-11-22 DIAGNOSIS — F17210 Nicotine dependence, cigarettes, uncomplicated: Secondary | ICD-10-CM | POA: Insufficient documentation

## 2016-11-22 DIAGNOSIS — J302 Other seasonal allergic rhinitis: Secondary | ICD-10-CM | POA: Diagnosis not present

## 2016-11-22 DIAGNOSIS — M5412 Radiculopathy, cervical region: Secondary | ICD-10-CM | POA: Insufficient documentation

## 2016-11-22 DIAGNOSIS — K089 Disorder of teeth and supporting structures, unspecified: Secondary | ICD-10-CM | POA: Diagnosis not present

## 2016-11-22 DIAGNOSIS — Z79899 Other long term (current) drug therapy: Secondary | ICD-10-CM | POA: Insufficient documentation

## 2016-11-22 DIAGNOSIS — M25561 Pain in right knee: Secondary | ICD-10-CM | POA: Diagnosis not present

## 2016-11-22 DIAGNOSIS — Z9889 Other specified postprocedural states: Secondary | ICD-10-CM | POA: Insufficient documentation

## 2016-11-22 DIAGNOSIS — Z0000 Encounter for general adult medical examination without abnormal findings: Secondary | ICD-10-CM

## 2016-11-22 DIAGNOSIS — J301 Allergic rhinitis due to pollen: Secondary | ICD-10-CM | POA: Diagnosis not present

## 2016-11-22 MED ORDER — IBUPROFEN 600 MG PO TABS: 600.0000 mg | ORAL_TABLET | Freq: Two times a day (BID) | ORAL | 5 refills | Status: DC

## 2016-11-22 MED ORDER — GABAPENTIN 300 MG PO CAPS: 300.0000 mg | ORAL_CAPSULE | Freq: Every day | ORAL | 3 refills | Status: DC

## 2016-11-22 MED ORDER — CETIRIZINE HCL 10 MG PO TABS: 10.0000 mg | ORAL_TABLET | Freq: Every day | ORAL | 11 refills | Status: DC

## 2016-11-22 MED ORDER — CYCLOBENZAPRINE HCL 10 MG PO TABS: 10.0000 mg | ORAL_TABLET | Freq: Three times a day (TID) | ORAL | 0 refills | Status: DC | PRN

## 2016-11-22 NOTE — Assessment & Plan Note (Signed)
Zyrtec ordered
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
## 2016-11-22 NOTE — Patient Instructions (Addendum)
Cody [name] was seen today for knee pain.  Diagnoses and all orders for this visit:  Chronic pain of right knee -     ibuprofen (ADVIL,MOTRIN) 600 MG tablet; Take 1 tablet (600 mg total) by mouth 2 (two) times daily with a meal. -     DG Knee AP/LAT W/Sunrise Right; Future  Cervical radicular pain -     gabapentin (NEURONTIN) 300 MG capsule; Take 1 capsule (300 mg total) by mouth at bedtime. -     ibuprofen (ADVIL,MOTRIN) 600 MG tablet; Take 1 tablet (600 mg total) by mouth 2 (two) times daily with a meal. -     cyclobenzaprine (FLEXERIL) 10 MG tablet; Take 1 tablet (10 mg total) by mouth 3 (three) times daily as needed for muscle spasms.  Acute seasonal allergic rhinitis due to pollen -     cetirizine (ZYRTEC) 10 MG tablet; Take 1 tablet (10 mg total) by mouth daily.  Healthcare maintenance -     Ambulatory referral to Gastroenterology  Poor dentition -     Ambulatory referral to Dentistry  please consider knee injection of steroid for pain relief For now increase ibuprofen for twice daily with food Flexeril for muscle pains  Complete x-ray, go to [facility] on the first floor or [facility] at [address]   f/u in 3 weeks for R knee pain   Dr. Armen Pickup

## 2016-11-22 NOTE — Assessment & Plan Note (Signed)
Improved with gabapentin and NSAID Some muscle spasm Plan: Continue current therapy Add flexeril

## 2016-12-05 ENCOUNTER — Telehealth: Payer: Self-pay

## 2016-12-05 NOTE — Telephone Encounter (Signed)
Pt was called and a VM was left informing pt to return phone call. 

## 2016-12-05 NOTE — Telephone Encounter (Signed)
Pt returned phone call and was informed of xray results.

## 2016-12-14 ENCOUNTER — Ambulatory Visit: Payer: Medicaid Other | Admitting: Family Medicine

## 2016-12-24 ENCOUNTER — Encounter: Payer: Self-pay | Admitting: Family Medicine

## 2016-12-26 ENCOUNTER — Encounter: Payer: Self-pay | Admitting: Family Medicine

## 2017-02-01 ENCOUNTER — Other Ambulatory Visit: Payer: Self-pay | Admitting: Family Medicine

## 2017-02-01 DIAGNOSIS — M5412 Radiculopathy, cervical region: Secondary | ICD-10-CM

## 2017-02-12 ENCOUNTER — Encounter: Payer: Self-pay | Admitting: Family Medicine

## 2017-02-12 ENCOUNTER — Ambulatory Visit: Payer: Medicaid Other | Attending: Family Medicine | Admitting: Family Medicine

## 2017-02-12 VITALS — BP 113/75 | HR 71 | Temp 97.7°F | Ht 71.0 in | Wt 179.8 lb

## 2017-02-12 DIAGNOSIS — F17210 Nicotine dependence, cigarettes, uncomplicated: Secondary | ICD-10-CM | POA: Insufficient documentation

## 2017-02-12 DIAGNOSIS — M79605 Pain in left leg: Secondary | ICD-10-CM | POA: Diagnosis not present

## 2017-02-12 DIAGNOSIS — Z79899 Other long term (current) drug therapy: Secondary | ICD-10-CM | POA: Diagnosis not present

## 2017-02-12 DIAGNOSIS — Z8781 Personal history of (healed) traumatic fracture: Secondary | ICD-10-CM | POA: Insufficient documentation

## 2017-02-12 MED ORDER — CANE MISC: 1.0000 | Freq: Every day | 0 refills | Status: DC

## 2017-02-12 MED ORDER — METHOCARBAMOL 750 MG PO TABS: 750.0000 mg | ORAL_TABLET | Freq: Four times a day (QID) | ORAL | 0 refills | Status: DC
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
## 2017-02-12 NOTE — Patient Instructions (Addendum)
Cody HuhDanny was seen today for leg pain.  Diagnoses and all orders for this visit:  Left leg pain -     DG Tibia/Fibula Left; Future -     Uric Acid -     methocarbamol (ROBAXIN) 750 MG tablet; Take 1 tablet (750 mg total) by mouth 4 (four) times daily.   Stop flexeril and take robaxin for muscle cramps  [facility]  and they prefer patient to call to schedule an appointment Ph. # [phone] address [address] [place], [place] [id number].  F/u in 6 weeks for left leg pain   Dr. Armen PickupFunches

## 2017-02-13 DIAGNOSIS — M79605 Pain in left leg: Secondary | ICD-10-CM | POA: Insufficient documentation

## 2017-02-13 LAB — URIC ACID: URIC ACID: 5.6 mg/dL (ref 3.7–8.6)

## 2017-02-13 NOTE — Assessment & Plan Note (Signed)
Left leg pain with cramping Normal pulses No recent trauma No rash or skin changes No signs of DVT  X-ray ordered  Robaxin for pain Cane ordered  Uric acid checked and normal

## 2017-02-14 ENCOUNTER — Telehealth: Payer: Self-pay

## 2017-02-14 NOTE — Telephone Encounter (Signed)
Pt was called and informed of lab results. 

## 2017-04-05 ENCOUNTER — Ambulatory Visit: Payer: Medicaid Other | Admitting: Internal Medicine

## 2017-04-23 ENCOUNTER — Telehealth: Payer: Self-pay | Admitting: Internal Medicine

## 2017-04-23 NOTE — Telephone Encounter (Signed)
The is no appointment scheduled sooner for pt to be seen. Pt has an appointment scheduled with you for 10-25 would you be willing to see patient sooner in a slot. If so please let Cody [name] know to scheduled patient

## 2017-04-23 NOTE — Telephone Encounter (Signed)
Pt came tot the office to drop up some paperwork for the pcp to fill up and to please call him when is ready for pick up, please follow up

## 2017-04-23 NOTE — Telephone Encounter (Signed)
Mikle BosworthCarlos could you schedule patient an 15 mins appointment per Dr. Laural BenesJohnson

## 2017-04-23 NOTE — Telephone Encounter (Signed)
Needs appt.  Ok for 15 min slot.

## 2017-04-24 ENCOUNTER — Encounter (HOSPITAL_COMMUNITY): Payer: Self-pay | Admitting: Emergency Medicine

## 2017-04-24 ENCOUNTER — Emergency Department (HOSPITAL_COMMUNITY): Payer: Medicaid Other

## 2017-04-24 ENCOUNTER — Emergency Department (HOSPITAL_COMMUNITY)
Admission: EM | Admit: 2017-04-24 | Discharge: 2017-04-24 | Disposition: A | Discharge status: Home / Self Care | Payer: Medicaid Other | Attending: Emergency Medicine | Admitting: Emergency Medicine

## 2017-04-24 DIAGNOSIS — G8929 Other chronic pain: Secondary | ICD-10-CM | POA: Insufficient documentation

## 2017-04-24 DIAGNOSIS — Z79899 Other long term (current) drug therapy: Secondary | ICD-10-CM | POA: Diagnosis not present

## 2017-04-24 DIAGNOSIS — F17210 Nicotine dependence, cigarettes, uncomplicated: Secondary | ICD-10-CM | POA: Diagnosis not present

## 2017-04-24 DIAGNOSIS — M25572 Pain in left ankle and joints of left foot: Secondary | ICD-10-CM | POA: Diagnosis not present

## 2017-04-24 DIAGNOSIS — M25561 Pain in right knee: Secondary | ICD-10-CM | POA: Diagnosis not present

## 2017-04-24 MED ORDER — MELOXICAM 15 MG PO TABS: 15.0000 mg | ORAL_TABLET | Freq: Every day | ORAL | 0 refills | Status: DC

## 2017-04-24 NOTE — ED Notes (Signed)
Ortho tech notified.  

## 2017-04-24 NOTE — Discharge Instructions (Signed)
Please follow up with your primary care doctor about obtaining a cane. Get help right away if: You develop severe joint pain, swelling, or redness. Many joints become painful and swollen. You develop severe back pain. You develop severe weakness in your leg. You cannot control your bladder or bowels.
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
## 2017-04-24 NOTE — Progress Notes (Signed)
Orthopedic Tech Progress Note Patient Details:  Cody BoschDanny L [name] 06/14/1961 [id number]  Ortho Devices Type of Ortho Device: ASO, Crutches, Knee Sleeve Ortho Device/Splint Interventions: Application   Saul FordyceJennifer C Semaj Kham 04/24/2017, 2:49 PM

## 2017-04-24 NOTE — ED Notes (Signed)
[facility]

## 2017-04-24 NOTE — ED Triage Notes (Signed)
Pt states his right knee has been hurting him and his left ankle. Left ankle has screws in it and his MD told him to have some xrays done. Pt is ambulatory.

## 2017-04-24 NOTE — ED Notes (Signed)
Returned from xray

## 2017-04-25 ENCOUNTER — Ambulatory Visit (INDEPENDENT_AMBULATORY_CARE_PROVIDER_SITE_OTHER): Payer: Medicaid Other | Admitting: Orthopaedic Surgery

## 2017-04-25 DIAGNOSIS — M76822 Posterior tibial tendinitis, left leg: Secondary | ICD-10-CM | POA: Diagnosis not present
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
## 2017-04-29 ENCOUNTER — Ambulatory Visit: Payer: Self-pay | Admitting: Internal Medicine

## 2017-05-01 ENCOUNTER — Ambulatory Visit: Payer: Medicaid Other | Attending: Orthopaedic Surgery

## 2017-05-03 ENCOUNTER — Ambulatory Visit: Payer: Medicaid Other | Admitting: Internal Medicine

## 2017-05-16 ENCOUNTER — Ambulatory Visit: Payer: Medicaid Other | Admitting: Internal Medicine

## 2017-06-06 ENCOUNTER — Ambulatory Visit: Payer: Medicaid Other | Attending: Internal Medicine | Admitting: Internal Medicine

## 2017-06-06 ENCOUNTER — Encounter: Payer: Self-pay | Admitting: Internal Medicine

## 2017-06-06 VITALS — BP 135/84 | HR 74 | Temp 98.2°F | Resp 16 | Wt 188.4 lb

## 2017-06-06 DIAGNOSIS — Z79899 Other long term (current) drug therapy: Secondary | ICD-10-CM | POA: Diagnosis not present

## 2017-06-06 DIAGNOSIS — M7752 Other enthesopathy of left foot: Secondary | ICD-10-CM | POA: Diagnosis not present

## 2017-06-06 DIAGNOSIS — F17210 Nicotine dependence, cigarettes, uncomplicated: Secondary | ICD-10-CM | POA: Diagnosis not present

## 2017-06-06 DIAGNOSIS — K219 Gastro-esophageal reflux disease without esophagitis: Secondary | ICD-10-CM | POA: Diagnosis not present

## 2017-06-06 DIAGNOSIS — Z720 Tobacco use: Secondary | ICD-10-CM | POA: Diagnosis not present

## 2017-06-06 DIAGNOSIS — Z23 Encounter for immunization: Secondary | ICD-10-CM | POA: Insufficient documentation

## 2017-06-06 DIAGNOSIS — M76822 Posterior tibial tendinitis, left leg: Secondary | ICD-10-CM | POA: Insufficient documentation

## 2017-06-06 DIAGNOSIS — Z1211 Encounter for screening for malignant neoplasm of colon: Secondary | ICD-10-CM | POA: Diagnosis not present
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
## 2017-06-06 NOTE — Patient Instructions (Addendum)
Smoking Tobacco Information Smoking tobacco will very likely harm your health. Tobacco contains a poisonous (toxic), colorless chemical called nicotine. Nicotine affects the brain and makes tobacco addictive. This change in your brain can make it hard to stop smoking. Tobacco also has other toxic chemicals that can hurt your body and raise your risk of many cancers. How can smoking tobacco affect me? Smoking tobacco can increase your chances of having serious health conditions, such as:  Cancer. Smoking is most commonly associated with lung cancer, but can lead to cancer in other parts of the body.  Chronic obstructive pulmonary disease (COPD). This is a long-term lung condition that makes it hard to breathe. It also gets worse over time.  High blood pressure (hypertension), heart disease, stroke, or heart attack.  Lung infections, such as pneumonia.  Cataracts. This is when the lenses in the eyes become clouded.  Digestive problems. This may include peptic ulcers, heartburn, and gastroesophageal reflux disease (GERD).  Oral health problems, such as gum disease and tooth loss.  Loss of taste and smell.  Smoking can affect your appearance by causing:  Wrinkles.  Yellow or stained teeth, fingers, and fingernails.  Smoking tobacco can also affect your social life.  Many workplaces, [employer], hotels, and public places are tobacco-free. This means that you may experience challenges in finding places to smoke when away from home.  The cost of a smoking habit can be expensive. Expenses for someone who smokes come in two ways: ? You spend money on a regular basis to buy tobacco. ? Your health care costs in the long-term are higher if you smoke.  Tobacco smoke can also affect the health of those around you. Children of smokers have greater chances of: ? Sudden infant death syndrome (SIDS). ? Ear infections. ? Lung infections.  What lifestyle changes can be made?  Do not start  smoking. Quit if you already do.  To quit smoking: ? Make a plan to quit smoking and commit yourself to it. Look for programs to help you and ask your health care provider for recommendations and ideas. ? Talk with your health care provider about using nicotine replacement medicines to help you quit. Medicine replacement medicines include gum, lozenges, patches, sprays, or pills. ? Do not replace cigarette smoking with electronic cigarettes, which are commonly called e-cigarettes. The safety of e-cigarettes is not known, and some may contain harmful chemicals. ? Avoid places, people, or situations that tempt you to smoke. ? If you try to quit but return to smoking, don't give up hope. It is very common for people to try a number of times before they fully succeed. When you feel ready again, give it another try.  Quitting smoking might affect the way you eat as well as your weight. Be prepared to monitor your eating habits. Get support in planning and following a healthy diet.  Ask your health care provider about having regular tests (screenings) to check for cancer. This may include blood tests, imaging tests, and other tests.  Exercise regularly. Consider taking walks, joining a gym, or doing yoga or exercise classes.  Develop skills to manage your stress. These skills include meditation. What are the benefits of quitting smoking? By quitting smoking, you may:  Lower your risk of getting cancer and other diseases caused by smoking.  Live longer.  Breathe better.  Lower your blood pressure and heart rate.  Stop your addiction to tobacco.  Stop creating secondhand smoke that hurts other people.  Improve  your sense of taste and smell.  Look better over time, due to having fewer wrinkles and less staining.  What can happen if changes are not made? If you do not stop smoking, you may:  Get cancer and other diseases.  Develop COPD or other long-term (chronic) lung  conditions.  Develop serious problems with your heart and blood vessels (cardiovascular system).  Need more tests to screen for problems caused by smoking.  Have higher, long-term healthcare costs from medicines or treatments related to smoking.  Continue to have worsening changes in your lungs, mouth, and nose.  Where to find support: To get support to quit smoking, consider:  Asking your health care provider for more information and resources.  Taking classes to learn more about quitting smoking.  Looking for local organizations that offer resources about quitting smoking.  Joining a support group for people who want to quit smoking in your local community.  Where to find more information: You may find more information about quitting smoking from:  HelpGuide.org: www.helpguide.org/articles/addictions/how-to-quit-smoking.htm  [employer]: smokefree.gov  American Lung Association: www.lung.org  Contact a health care provider if:  You have problems breathing.  Your lips, nose, or fingers turn blue.  You have chest pain.  You are coughing up blood.  You feel faint or you pass out.  You have other noticeable changes that cause you to worry. Summary  Smoking tobacco can negatively affect your health, the health of those around you, your finances, and your social life.  Do not start smoking. Quit if you already do. If you need help quitting, ask your health care provider.  Think about joining a support group for people who want to quit smoking in your local community. There are many effective programs that will help you to quit this behavior. This information is not intended to replace advice given to you by your health care provider. Make sure you discuss any questions you have with your health care provider. Document Released: 08/14/2016 Document Revised: 08/14/2016 Document Reviewed: 08/14/2016 Elsevier Interactive Patient Education  2018 Elsevier  Inc.  Influenza Virus Vaccine injection (Fluarix) What is this medicine? INFLUENZA VIRUS VACCINE (in floo EN zuh VAHY ruhs vak SEEN) helps to reduce the risk of getting influenza also known as the flu. This medicine may be used for other purposes; ask your health care provider or pharmacist if you have questions. COMMON BRAND NAME(S): Fluarix, Fluzone What should I tell my health care provider before I take this medicine? They need to know if you have any of these conditions: -bleeding disorder like hemophilia -fever or infection -Guillain-Barre syndrome or other neurological problems -immune system problems -infection with the human immunodeficiency virus (HIV) or AIDS -low blood platelet counts -multiple sclerosis -an unusual or allergic reaction to influenza virus vaccine, eggs, chicken proteins, latex, gentamicin, other medicines, foods, dyes or preservatives -pregnant or trying to get pregnant -breast-feeding How should I use this medicine? This vaccine is for injection into a muscle. It is given by a health care professional. A copy of Vaccine Information Statements will be given before each vaccination. Read this sheet carefully each time. The sheet may change frequently. Talk to your pediatrician regarding the use of this medicine in children. Special care may be needed. Overdosage: If you think you have taken too much of this medicine contact a poison control center or emergency room at once. NOTE: This medicine is only for you. Do not share this medicine with others. What if I miss a dose? This does  not apply. What may interact with this medicine? -chemotherapy or radiation therapy -medicines that lower your immune system like etanercept, anakinra, infliximab, and adalimumab -medicines that treat or prevent blood clots like warfarin -phenytoin -steroid medicines like prednisone or cortisone -theophylline -vaccines This list may not describe all possible interactions. Give  your health care provider a list of all the medicines, herbs, non-prescription drugs, or dietary supplements you use. Also tell them if you smoke, drink alcohol, or use illegal drugs. Some items may interact with your medicine. What should I watch for while using this medicine? Report any side effects that do not go away within 3 days to your doctor or health care professional. Call your health care provider if any unusual symptoms occur within 6 weeks of receiving this vaccine. You may still catch the flu, but the illness is not usually as bad. You cannot get the flu from the vaccine. The vaccine will not protect against colds or other illnesses that may cause fever. The vaccine is needed every year. What side effects may I notice from receiving this medicine? Side effects that you should report to your doctor or health care professional as soon as possible: -allergic reactions like skin rash, itching or hives, swelling of the face, lips, or tongue Side effects that usually do not require medical attention (report to your doctor or health care professional if they continue or are bothersome): -fever -headache -muscle aches and pains -pain, tenderness, redness, or swelling at site where injected -weak or tired This list may not describe all possible side effects. Call your doctor for medical advice about side effects. You may report side effects to FDA at 1-800-FDA-1088. Where should I keep my medicine? This vaccine is only given in a clinic, pharmacy, doctor's office, or other health care setting and will not be stored at home. NOTE: This sheet is a summary. It may not cover all possible information. If you have questions about this medicine, talk to your doctor, pharmacist, or health care provider.  2018 Elsevier/Gold Standard (2008-02-25 09:30:40)

## 2017-06-06 NOTE — Progress Notes (Signed)
Pt states he dropped off some paperwork for pcs

## 2017-10-07 ENCOUNTER — Encounter: Payer: Self-pay | Admitting: Internal Medicine

## 2017-10-07 ENCOUNTER — Ambulatory Visit: Payer: Medicaid Other | Attending: Internal Medicine | Admitting: Internal Medicine

## 2017-10-07 VITALS — BP 102/63 | HR 55 | Temp 97.8°F | Resp 16 | Ht 70.5 in | Wt 178.4 lb

## 2017-10-07 DIAGNOSIS — J069 Acute upper respiratory infection, unspecified: Secondary | ICD-10-CM | POA: Diagnosis not present

## 2017-10-07 DIAGNOSIS — F1020 Alcohol dependence, uncomplicated: Secondary | ICD-10-CM | POA: Insufficient documentation

## 2017-10-07 DIAGNOSIS — G8929 Other chronic pain: Secondary | ICD-10-CM | POA: Insufficient documentation

## 2017-10-07 DIAGNOSIS — Z833 Family history of diabetes mellitus: Secondary | ICD-10-CM | POA: Diagnosis not present

## 2017-10-07 DIAGNOSIS — R634 Abnormal weight loss: Secondary | ICD-10-CM | POA: Diagnosis not present

## 2017-10-07 DIAGNOSIS — M79605 Pain in left leg: Secondary | ICD-10-CM

## 2017-10-07 DIAGNOSIS — Z720 Tobacco use: Secondary | ICD-10-CM

## 2017-10-07 DIAGNOSIS — Z1211 Encounter for screening for malignant neoplasm of colon: Secondary | ICD-10-CM | POA: Diagnosis not present

## 2017-10-07 DIAGNOSIS — Z79899 Other long term (current) drug therapy: Secondary | ICD-10-CM | POA: Diagnosis not present

## 2017-10-07 DIAGNOSIS — F17210 Nicotine dependence, cigarettes, uncomplicated: Secondary | ICD-10-CM | POA: Diagnosis not present

## 2017-10-07 DIAGNOSIS — K029 Dental caries, unspecified: Secondary | ICD-10-CM | POA: Diagnosis not present

## 2017-10-07 DIAGNOSIS — K219 Gastro-esophageal reflux disease without esophagitis: Secondary | ICD-10-CM | POA: Diagnosis not present

## 2017-10-07 MED ORDER — DICLOFENAC SODIUM 1 % TD GEL: 2.0000 g | Freq: Four times a day (QID) | TRANSDERMAL | 3 refills | Status: DC

## 2017-10-07 MED ORDER — MULTIVITAMIN ADULT PO TABS: 1.0000 | ORAL_TABLET | Freq: Every day | ORAL | 2 refills | Status: DC

## 2017-10-07 MED ORDER — BENZONATATE 100 MG PO CAPS
100.0000 mg | ORAL_CAPSULE | Freq: Two times a day (BID) | ORAL | 0 refills | Status: DC | PRN
Start: 2017-10-07 — End: 2022-05-06

## 2017-10-07 NOTE — Patient Instructions (Signed)
Continue to work on cutting back on alcohol consumption.    Try to get in all three meals daily.
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
## 2017-10-08 ENCOUNTER — Telehealth: Payer: Self-pay

## 2017-10-08 LAB — CBC
Hematocrit: 41.4 % (ref 37.5–51.0)
Hemoglobin: 14.6 g/dL (ref 13.0–17.7)
MCH: 35.2 pg — AB (ref 26.6–33.0)
MCHC: 35.3 g/dL (ref 31.5–35.7)
MCV: 100 fL — ABNORMAL HIGH (ref 79–97)
PLATELETS: 250 10*3/uL (ref 150–379)
RBC: 4.15 x10E6/uL (ref 4.14–5.80)
RDW: 12.7 % (ref 12.3–15.4)
WBC: 5.3 10*3/uL (ref 3.4–10.8)

## 2017-10-08 LAB — COMPREHENSIVE METABOLIC PANEL
ALBUMIN: 4.4 g/dL (ref 3.5–5.5)
ALK PHOS: 69 IU/L (ref 39–117)
ALT: 12 IU/L (ref 0–44)
AST: 15 IU/L (ref 0–40)
Albumin/Globulin Ratio: 1.5 (ref 1.2–2.2)
BUN / CREAT RATIO: 13 (ref 9–20)
BUN: 20 mg/dL (ref 6–24)
Bilirubin Total: 0.6 mg/dL (ref 0.0–1.2)
CHLORIDE: 103 mmol/L (ref 96–106)
CO2: 24 mmol/L (ref 20–29)
CREATININE: 1.57 mg/dL — AB (ref 0.76–1.27)
Calcium: 9.6 mg/dL (ref 8.7–10.2)
GFR calc Af Amer: 55 mL/min/{1.73_m2} — ABNORMAL LOW (ref >59)
GFR calc non Af Amer: 48 mL/min/{1.73_m2} — ABNORMAL LOW (ref >59)
GLUCOSE: 69 mg/dL (ref 65–99)
Globulin, Total: 2.9 g/dL (ref 1.5–4.5)
Potassium: 4.5 mmol/L (ref 3.5–5.2)
Sodium: 141 mmol/L (ref 134–144)
Total Protein: 7.3 g/dL (ref 6.0–8.5)

## 2017-10-08 LAB — LIPID PANEL
CHOLESTEROL TOTAL: 197 mg/dL (ref 100–199)
Chol/HDL Ratio: 4.4 ratio (ref 0.0–5.0)
HDL: 45 mg/dL (ref >39)
LDL CALC: 114 mg/dL — AB (ref 0–99)
Triglycerides: 190 mg/dL — ABNORMAL HIGH (ref 0–149)
VLDL CHOLESTEROL CAL: 38 mg/dL (ref 5–40)

## 2017-10-08 LAB — HEMOGLOBIN A1C
Est. average glucose Bld gHb Est-mCnc: 94 mg/dL
HEMOGLOBIN A1C: 4.9 % (ref 4.8–5.6)

## 2017-10-08 LAB — HEPATITIS C ANTIBODY

## 2017-10-08 LAB — HIV ANTIBODY (ROUTINE TESTING W REFLEX): HIV Screen 4th Generation wRfx: NONREACTIVE

## 2017-10-08 NOTE — Telephone Encounter (Signed)
Contacted pt to go over lab results pt didn't answer lvm asking pt to give me a call at his earliest convenience  

## 2017-10-10 ENCOUNTER — Telehealth: Payer: Self-pay

## 2017-10-10 NOTE — Telephone Encounter (Signed)
Contacted pt to go over lab results pt is aware and doesn't have any questions or concerns 

## 2017-10-24 ENCOUNTER — Encounter (HOSPITAL_COMMUNITY): Payer: Self-pay

## 2017-10-24 ENCOUNTER — Emergency Department (HOSPITAL_COMMUNITY): Payer: Medicaid Other

## 2017-10-24 ENCOUNTER — Emergency Department (HOSPITAL_COMMUNITY)
Admission: EM | Admit: 2017-10-24 | Discharge: 2017-10-24 | Disposition: A | Discharge status: Home / Self Care | Payer: Medicaid Other | Attending: Emergency Medicine | Admitting: Emergency Medicine

## 2017-10-24 ENCOUNTER — Other Ambulatory Visit: Payer: Self-pay

## 2017-10-24 DIAGNOSIS — M1612 Unilateral primary osteoarthritis, left hip: Secondary | ICD-10-CM | POA: Insufficient documentation

## 2017-10-24 DIAGNOSIS — F17210 Nicotine dependence, cigarettes, uncomplicated: Secondary | ICD-10-CM | POA: Insufficient documentation

## 2017-10-24 DIAGNOSIS — M25552 Pain in left hip: Secondary | ICD-10-CM | POA: Diagnosis present

## 2017-10-24 DIAGNOSIS — Z79899 Other long term (current) drug therapy: Secondary | ICD-10-CM | POA: Insufficient documentation

## 2017-10-24 MED ORDER — MELOXICAM 7.5 MG PO TABS: 7.5000 mg | ORAL_TABLET | Freq: Every day | ORAL | 0 refills | Status: DC
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
## 2017-10-24 NOTE — Discharge Instructions (Signed)
See your Physician for recheck in 2 weeks.
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
## 2017-11-15 ENCOUNTER — Encounter: Payer: Self-pay | Admitting: Internal Medicine

## 2017-11-15 ENCOUNTER — Ambulatory Visit: Payer: Medicaid Other | Attending: Internal Medicine | Admitting: Internal Medicine

## 2017-11-15 VITALS — BP 124/79 | HR 51 | Temp 98.0°F | Resp 16 | Wt 178.4 lb

## 2017-11-15 DIAGNOSIS — M1992 Post-traumatic osteoarthritis, unspecified site: Secondary | ICD-10-CM | POA: Insufficient documentation

## 2017-11-15 DIAGNOSIS — R634 Abnormal weight loss: Secondary | ICD-10-CM | POA: Diagnosis not present

## 2017-11-15 DIAGNOSIS — M5412 Radiculopathy, cervical region: Secondary | ICD-10-CM | POA: Diagnosis not present

## 2017-11-15 DIAGNOSIS — N289 Disorder of kidney and ureter, unspecified: Secondary | ICD-10-CM | POA: Diagnosis not present

## 2017-11-15 DIAGNOSIS — M79662 Pain in left lower leg: Secondary | ICD-10-CM | POA: Insufficient documentation

## 2017-11-15 DIAGNOSIS — F17210 Nicotine dependence, cigarettes, uncomplicated: Secondary | ICD-10-CM | POA: Insufficient documentation

## 2017-11-15 DIAGNOSIS — Z125 Encounter for screening for malignant neoplasm of prostate: Secondary | ICD-10-CM

## 2017-11-15 DIAGNOSIS — M160 Bilateral primary osteoarthritis of hip: Secondary | ICD-10-CM | POA: Diagnosis not present

## 2017-11-15 DIAGNOSIS — M164 Bilateral post-traumatic osteoarthritis of hip: Secondary | ICD-10-CM

## 2017-11-15 DIAGNOSIS — G8929 Other chronic pain: Secondary | ICD-10-CM

## 2017-11-15 DIAGNOSIS — M545 Low back pain, unspecified: Secondary | ICD-10-CM

## 2017-11-15 DIAGNOSIS — Z79899 Other long term (current) drug therapy: Secondary | ICD-10-CM | POA: Diagnosis not present

## 2017-11-15 DIAGNOSIS — K219 Gastro-esophageal reflux disease without esophagitis: Secondary | ICD-10-CM | POA: Diagnosis not present

## 2017-11-15 NOTE — Patient Instructions (Signed)
Stop Meloxicam. Avoid taking any over the counter pain medications except Tylenol.
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
## 2017-11-16 LAB — BASIC METABOLIC PANEL
BUN/Creatinine Ratio: 14 (ref 9–20)
BUN: 15 mg/dL (ref 6–24)
CHLORIDE: 106 mmol/L (ref 96–106)
CO2: 24 mmol/L (ref 20–29)
Calcium: 9.1 mg/dL (ref 8.7–10.2)
Creatinine, Ser: 1.06 mg/dL (ref 0.76–1.27)
GFR calc Af Amer: 89 mL/min/{1.73_m2} (ref >59)
GFR calc non Af Amer: 77 mL/min/{1.73_m2} (ref >59)
GLUCOSE: 104 mg/dL — AB (ref 65–99)
POTASSIUM: 4.8 mmol/L (ref 3.5–5.2)
SODIUM: 142 mmol/L (ref 134–144)

## 2017-11-16 LAB — PSA: Prostate Specific Ag, Serum: 1 ng/mL (ref 0.0–4.0)

## 2017-11-18 ENCOUNTER — Telehealth: Payer: Self-pay

## 2017-11-18 NOTE — Telephone Encounter (Signed)
Contacted pt to go over lab results pt didn't answer and was unable to lvm  If pt calls back please give results: kidney function is back to nl. Prostate level nl.

## 2017-11-20 ENCOUNTER — Emergency Department (HOSPITAL_COMMUNITY)
Admission: EM | Admit: 2017-11-20 | Discharge: 2017-11-20 | Disposition: A | Discharge status: Home / Self Care | Payer: Medicaid Other | Attending: Physician Assistant | Admitting: Physician Assistant

## 2017-11-20 ENCOUNTER — Encounter (HOSPITAL_COMMUNITY): Payer: Self-pay | Admitting: Emergency Medicine

## 2017-11-20 DIAGNOSIS — T7840XA Allergy, unspecified, initial encounter: Secondary | ICD-10-CM

## 2017-11-20 DIAGNOSIS — R21 Rash and other nonspecific skin eruption: Secondary | ICD-10-CM | POA: Diagnosis present

## 2017-11-20 DIAGNOSIS — F17210 Nicotine dependence, cigarettes, uncomplicated: Secondary | ICD-10-CM | POA: Insufficient documentation

## 2017-11-20 MED ORDER — DIPHENHYDRAMINE HCL 25 MG PO TABS: 25.0000 mg | ORAL_TABLET | Freq: Four times a day (QID) | ORAL | 0 refills | Status: DC

## 2017-11-20 MED ORDER — FAMOTIDINE 20 MG PO TABS: 20.0000 mg | ORAL_TABLET | Freq: Two times a day (BID) | ORAL | 0 refills | Status: DC

## 2017-11-20 MED ORDER — FAMOTIDINE 20 MG PO TABS
20.0000 mg | ORAL_TABLET | Freq: Once | ORAL | Status: AC
  Administered 2017-11-20: 20 mg via ORAL
  Filled 2017-11-20: qty 1

## 2017-11-20 MED ORDER — EPINEPHRINE 0.3 MG/0.3ML IJ SOAJ: 0.3000 mg | Freq: Once | INTRAMUSCULAR | 1 refills | Status: AC

## 2017-11-20 MED ORDER — DIPHENHYDRAMINE HCL 25 MG PO CAPS
50.0000 mg | ORAL_CAPSULE | Freq: Once | ORAL | Status: AC
  Administered 2017-11-20: 50 mg via ORAL
  Filled 2017-11-20: qty 2

## 2017-11-20 NOTE — ED Triage Notes (Signed)
Pt to ER for evaluation of allergic reaction - rash, present to full body after ingestion of new Gatorade. Airway intact. Pt in NAD.
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
## 2017-11-20 NOTE — ED Notes (Signed)
Pt departed in NAD, refused use of wheelchair.  

## 2017-11-20 NOTE — Discharge Instructions (Signed)
Take Benadryl every 6 hours for the next 3 days.  Take Pepcid twice daily for 3 days.  In the future reactions causing shortness of breath, swelling of the lips, tongue, throat, EpiPen as prescribed and come straight to the emergency department.  Please return to the emergency department if you develop any new or worsening symptoms.

## 2018-01-07 ENCOUNTER — Encounter: Payer: Self-pay | Admitting: Internal Medicine

## 2018-01-07 ENCOUNTER — Ambulatory Visit: Payer: Medicaid Other | Attending: Internal Medicine | Admitting: Internal Medicine

## 2018-01-07 VITALS — BP 105/67 | HR 54 | Temp 97.8°F | Resp 16 | Wt 173.2 lb

## 2018-01-07 DIAGNOSIS — F1010 Alcohol abuse, uncomplicated: Secondary | ICD-10-CM | POA: Insufficient documentation

## 2018-01-07 DIAGNOSIS — Z79899 Other long term (current) drug therapy: Secondary | ICD-10-CM | POA: Diagnosis not present

## 2018-01-07 DIAGNOSIS — K219 Gastro-esophageal reflux disease without esophagitis: Secondary | ICD-10-CM | POA: Diagnosis not present

## 2018-01-07 DIAGNOSIS — M160 Bilateral primary osteoarthritis of hip: Secondary | ICD-10-CM | POA: Insufficient documentation

## 2018-01-07 DIAGNOSIS — M79671 Pain in right foot: Secondary | ICD-10-CM | POA: Diagnosis present

## 2018-01-07 DIAGNOSIS — M79672 Pain in left foot: Secondary | ICD-10-CM

## 2018-01-07 DIAGNOSIS — F17210 Nicotine dependence, cigarettes, uncomplicated: Secondary | ICD-10-CM | POA: Insufficient documentation

## 2018-01-07 DIAGNOSIS — N289 Disorder of kidney and ureter, unspecified: Secondary | ICD-10-CM | POA: Diagnosis not present

## 2018-01-07 DIAGNOSIS — G8929 Other chronic pain: Secondary | ICD-10-CM | POA: Diagnosis not present

## 2018-01-07 MED ORDER — NAPROXEN 500 MG PO TABS: 500.0000 mg | ORAL_TABLET | Freq: Two times a day (BID) | ORAL | 0 refills | Status: DC | PRN

## 2018-01-07 NOTE — Patient Instructions (Signed)
Please come in for evaluation the next time you have swelling and redness in the toes.    Take the Naprosyn as needed for the current pain you are having in the left foot.
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
## 2018-01-17 ENCOUNTER — Emergency Department (HOSPITAL_COMMUNITY): Payer: Medicaid Other

## 2018-01-17 ENCOUNTER — Encounter (HOSPITAL_COMMUNITY): Payer: Self-pay

## 2018-01-17 ENCOUNTER — Inpatient Hospital Stay (HOSPITAL_COMMUNITY)
Admission: EM | Admit: 2018-01-17 | Discharge: 2018-01-22 | DRG: 871 | Disposition: A | Discharge status: Skilled Nursing Facility | Payer: Medicaid Other | Attending: Family Medicine | Admitting: Family Medicine

## 2018-01-17 ENCOUNTER — Other Ambulatory Visit: Payer: Self-pay

## 2018-01-17 DIAGNOSIS — D539 Nutritional anemia, unspecified: Secondary | ICD-10-CM

## 2018-01-17 DIAGNOSIS — N179 Acute kidney failure, unspecified: Secondary | ICD-10-CM | POA: Diagnosis present

## 2018-01-17 DIAGNOSIS — D509 Iron deficiency anemia, unspecified: Secondary | ICD-10-CM | POA: Diagnosis present

## 2018-01-17 DIAGNOSIS — J449 Chronic obstructive pulmonary disease, unspecified: Secondary | ICD-10-CM | POA: Diagnosis present

## 2018-01-17 DIAGNOSIS — R918 Other nonspecific abnormal finding of lung field: Secondary | ICD-10-CM | POA: Diagnosis present

## 2018-01-17 DIAGNOSIS — R6520 Severe sepsis without septic shock: Secondary | ICD-10-CM | POA: Diagnosis present

## 2018-01-17 DIAGNOSIS — R400 Somnolence: Secondary | ICD-10-CM

## 2018-01-17 DIAGNOSIS — J189 Pneumonia, unspecified organism: Secondary | ICD-10-CM

## 2018-01-17 DIAGNOSIS — A419 Sepsis, unspecified organism: Secondary | ICD-10-CM

## 2018-01-17 DIAGNOSIS — J9601 Acute respiratory failure with hypoxia: Secondary | ICD-10-CM | POA: Diagnosis present

## 2018-01-17 DIAGNOSIS — Z720 Tobacco use: Secondary | ICD-10-CM | POA: Diagnosis present

## 2018-01-17 DIAGNOSIS — J690 Pneumonitis due to inhalation of food and vomit: Secondary | ICD-10-CM | POA: Diagnosis present

## 2018-01-17 DIAGNOSIS — K219 Gastro-esophageal reflux disease without esophagitis: Secondary | ICD-10-CM | POA: Diagnosis present

## 2018-01-17 DIAGNOSIS — T5891XA Toxic effect of carbon monoxide from unspecified source, accidental (unintentional), initial encounter: Secondary | ICD-10-CM | POA: Diagnosis present

## 2018-01-17 DIAGNOSIS — F1410 Cocaine abuse, uncomplicated: Secondary | ICD-10-CM | POA: Diagnosis present

## 2018-01-17 DIAGNOSIS — T148XXA Other injury of unspecified body region, initial encounter: Secondary | ICD-10-CM

## 2018-01-17 DIAGNOSIS — F17210 Nicotine dependence, cigarettes, uncomplicated: Secondary | ICD-10-CM | POA: Diagnosis present

## 2018-01-17 DIAGNOSIS — G894 Chronic pain syndrome: Secondary | ICD-10-CM | POA: Diagnosis present

## 2018-01-17 DIAGNOSIS — G9341 Metabolic encephalopathy: Secondary | ICD-10-CM | POA: Diagnosis present

## 2018-01-17 LAB — I-STAT TROPONIN, ED: Troponin i, poc: 0.02 ng/mL (ref 0.00–0.08)

## 2018-01-17 LAB — CBC WITH DIFFERENTIAL/PLATELET
Basophils Absolute: 0 10*3/uL (ref 0.0–0.1)
Basophils Relative: 0 %
Eosinophils Absolute: 0 10*3/uL (ref 0.0–0.7)
Eosinophils Relative: 0 %
HCT: 37.6 % — ABNORMAL LOW (ref 39.0–52.0)
Hemoglobin: 12.6 g/dL — ABNORMAL LOW (ref 13.0–17.0)
Lymphocytes Relative: 9 %
Lymphs Abs: 1.1 10*3/uL (ref 0.7–4.0)
MCH: 35.8 pg — ABNORMAL HIGH (ref 26.0–34.0)
MCHC: 33.5 g/dL (ref 30.0–36.0)
MCV: 106.8 fL — ABNORMAL HIGH (ref 78.0–100.0)
Monocytes Absolute: 0.7 10*3/uL (ref 0.1–1.0)
Monocytes Relative: 6 %
Neutro Abs: 10.5 10*3/uL — ABNORMAL HIGH (ref 1.7–7.7)
Neutrophils Relative %: 85 %
Platelets: 189 10*3/uL (ref 150–400)
RBC: 3.52 MIL/uL — ABNORMAL LOW (ref 4.22–5.81)
RDW: 13.2 % (ref 11.5–15.5)
WBC: 12.3 10*3/uL — ABNORMAL HIGH (ref 4.0–10.5)

## 2018-01-17 LAB — COMPREHENSIVE METABOLIC PANEL
ALT: 46 U/L (ref 17–63)
AST: 51 U/L — ABNORMAL HIGH (ref 15–41)
Albumin: 4.3 g/dL (ref 3.5–5.0)
Alkaline Phosphatase: 72 U/L (ref 38–126)
Anion gap: 12 (ref 5–15)
BUN: 18 mg/dL (ref 6–20)
CO2: 22 mmol/L (ref 22–32)
Calcium: 9.1 mg/dL (ref 8.9–10.3)
Chloride: 105 mmol/L (ref 101–111)
Creatinine, Ser: 1.2 mg/dL (ref 0.61–1.24)
GFR calc Af Amer: >60 mL/min (ref >60)
GFR calc non Af Amer: >60 mL/min (ref >60)
Glucose, Bld: 89 mg/dL (ref 65–99)
Potassium: 4.1 mmol/L (ref 3.5–5.1)
Sodium: 139 mmol/L (ref 135–145)
Total Bilirubin: 0.7 mg/dL (ref 0.3–1.2)
Total Protein: 8.1 g/dL (ref 6.5–8.1)

## 2018-01-17 LAB — ETHANOL: Alcohol, Ethyl (B): <10 mg/dL (ref <10)

## 2018-01-17 LAB — CARBOXYHEMOGLOBIN - COOX: Carboxyhemoglobin: 2.6 % — ABNORMAL HIGH (ref 0.5–1.5)

## 2018-01-17 LAB — SALICYLATE LEVEL: Salicylate Lvl: <7 mg/dL (ref 2.8–30.0)

## 2018-01-17 LAB — ACETAMINOPHEN LEVEL: Acetaminophen (Tylenol), Serum: <10 ug/mL — ABNORMAL LOW (ref 10–30)

## 2018-01-17 MED ORDER — SODIUM CHLORIDE 0.9 % IV BOLUS
1000.0000 mL | Freq: Once | INTRAVENOUS | Status: AC
  Administered 2018-01-17: 1000 mL via INTRAVENOUS

## 2018-01-17 MED ORDER — ACETAMINOPHEN 325 MG PO TABS
650.0000 mg | ORAL_TABLET | Freq: Once | ORAL | Status: DC
Start: 2018-01-17 — End: 2018-01-22
  Filled 2018-01-17: qty 2

## 2018-01-17 MED ORDER — CEFTRIAXONE SODIUM 1 G IJ SOLR
1.0000 g | Freq: Once | INTRAMUSCULAR | Status: AC
  Administered 2018-01-18: 1 g via INTRAVENOUS
  Filled 2018-01-17: qty 10

## 2018-01-17 MED ORDER — SODIUM CHLORIDE 0.9 % IV SOLN
500.0000 mg | Freq: Once | INTRAVENOUS | Status: AC
  Administered 2018-01-18: 500 mg via INTRAVENOUS
  Filled 2018-01-17: qty 500

## 2018-01-17 NOTE — ED Notes (Signed)
Bed: WA19 Expected date:  Expected time:  Means of arrival:  Comments: Ems AMS 

## 2018-01-17 NOTE — ED Triage Notes (Signed)
[place]. Mother called EMS reporting patient not acting right. Patient does not answer questions but is responsive to noxious stimuli.
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
## 2018-01-18 ENCOUNTER — Inpatient Hospital Stay (HOSPITAL_COMMUNITY): Payer: Medicaid Other

## 2018-01-18 DIAGNOSIS — J189 Pneumonia, unspecified organism: Secondary | ICD-10-CM | POA: Diagnosis present

## 2018-01-18 DIAGNOSIS — F1410 Cocaine abuse, uncomplicated: Secondary | ICD-10-CM | POA: Diagnosis present

## 2018-01-18 DIAGNOSIS — R6520 Severe sepsis without septic shock: Secondary | ICD-10-CM | POA: Diagnosis present

## 2018-01-18 DIAGNOSIS — R918 Other nonspecific abnormal finding of lung field: Secondary | ICD-10-CM | POA: Diagnosis not present

## 2018-01-18 DIAGNOSIS — J9601 Acute respiratory failure with hypoxia: Secondary | ICD-10-CM | POA: Diagnosis present

## 2018-01-18 DIAGNOSIS — T5891XA Toxic effect of carbon monoxide from unspecified source, accidental (unintentional), initial encounter: Secondary | ICD-10-CM | POA: Diagnosis not present

## 2018-01-18 DIAGNOSIS — G9341 Metabolic encephalopathy: Secondary | ICD-10-CM

## 2018-01-18 DIAGNOSIS — F17210 Nicotine dependence, cigarettes, uncomplicated: Secondary | ICD-10-CM | POA: Diagnosis present

## 2018-01-18 DIAGNOSIS — D509 Iron deficiency anemia, unspecified: Secondary | ICD-10-CM | POA: Diagnosis present

## 2018-01-18 DIAGNOSIS — D539 Nutritional anemia, unspecified: Secondary | ICD-10-CM | POA: Diagnosis not present

## 2018-01-18 DIAGNOSIS — A419 Sepsis, unspecified organism: Secondary | ICD-10-CM

## 2018-01-18 DIAGNOSIS — I371 Nonrheumatic pulmonary valve insufficiency: Secondary | ICD-10-CM | POA: Diagnosis not present

## 2018-01-18 DIAGNOSIS — G894 Chronic pain syndrome: Secondary | ICD-10-CM | POA: Diagnosis present

## 2018-01-18 DIAGNOSIS — J690 Pneumonitis due to inhalation of food and vomit: Secondary | ICD-10-CM | POA: Diagnosis present

## 2018-01-18 DIAGNOSIS — R400 Somnolence: Secondary | ICD-10-CM | POA: Diagnosis present

## 2018-01-18 DIAGNOSIS — K219 Gastro-esophageal reflux disease without esophagitis: Secondary | ICD-10-CM | POA: Diagnosis present

## 2018-01-18 DIAGNOSIS — N179 Acute kidney failure, unspecified: Secondary | ICD-10-CM | POA: Diagnosis present

## 2018-01-18 DIAGNOSIS — J449 Chronic obstructive pulmonary disease, unspecified: Secondary | ICD-10-CM | POA: Diagnosis present

## 2018-01-18 LAB — URINALYSIS, ROUTINE W REFLEX MICROSCOPIC
Bilirubin Urine: NEGATIVE
Glucose, UA: NEGATIVE mg/dL
Hgb urine dipstick: NEGATIVE
Ketones, ur: 20 mg/dL — AB
Nitrite: NEGATIVE
Protein, ur: NEGATIVE mg/dL
Specific Gravity, Urine: 1.019 (ref 1.005–1.030)
pH: 5 (ref 5.0–8.0)

## 2018-01-18 LAB — COMPREHENSIVE METABOLIC PANEL
ALBUMIN: 3.5 g/dL (ref 3.5–5.0)
ALK PHOS: 55 U/L (ref 38–126)
ALT: 36 U/L (ref 17–63)
AST: 43 U/L — ABNORMAL HIGH (ref 15–41)
Anion gap: 7 (ref 5–15)
BUN: 16 mg/dL (ref 6–20)
CALCIUM: 8.4 mg/dL — AB (ref 8.9–10.3)
CO2: 26 mmol/L (ref 22–32)
CREATININE: 1.22 mg/dL (ref 0.61–1.24)
Chloride: 107 mmol/L (ref 101–111)
GFR calc Af Amer: >60 mL/min (ref >60)
GFR calc non Af Amer: >60 mL/min (ref >60)
Glucose, Bld: 94 mg/dL (ref 65–99)
Potassium: 4.1 mmol/L (ref 3.5–5.1)
SODIUM: 140 mmol/L (ref 135–145)
Total Bilirubin: 0.9 mg/dL (ref 0.3–1.2)
Total Protein: 6.6 g/dL (ref 6.5–8.1)

## 2018-01-18 LAB — BLOOD GAS, ARTERIAL
ACID-BASE DEFICIT: 2.1 mmol/L — AB (ref 0.0–2.0)
Bicarbonate: 23 mmol/L (ref 20.0–28.0)
Drawn by: 51425
O2 CONTENT: 3 L/min
O2 SAT: 92.4 %
PCO2 ART: 44.6 mmHg (ref 32.0–48.0)
PO2 ART: 71.4 mmHg — AB (ref 83.0–108.0)
Patient temperature: 100
pH, Arterial: 7.337 — ABNORMAL LOW (ref 7.350–7.450)

## 2018-01-18 LAB — CBC WITH DIFFERENTIAL/PLATELET
BASOS PCT: 0 %
Basophils Absolute: 0 10*3/uL (ref 0.0–0.1)
Eosinophils Absolute: 0.1 10*3/uL (ref 0.0–0.7)
Eosinophils Relative: 0 %
HEMATOCRIT: 36.2 % — AB (ref 39.0–52.0)
Hemoglobin: 11.8 g/dL — ABNORMAL LOW (ref 13.0–17.0)
Lymphocytes Relative: 13 %
Lymphs Abs: 1.8 10*3/uL (ref 0.7–4.0)
MCH: 34.8 pg — ABNORMAL HIGH (ref 26.0–34.0)
MCHC: 32.6 g/dL (ref 30.0–36.0)
MCV: 106.8 fL — ABNORMAL HIGH (ref 78.0–100.0)
MONO ABS: 0.5 10*3/uL (ref 0.1–1.0)
MONOS PCT: 4 %
NEUTROS ABS: 11 10*3/uL — AB (ref 1.7–7.7)
Neutrophils Relative %: 83 %
Platelets: 208 10*3/uL (ref 150–400)
RBC: 3.39 MIL/uL — ABNORMAL LOW (ref 4.22–5.81)
RDW: 13.2 % (ref 11.5–15.5)
WBC: 13.4 10*3/uL — ABNORMAL HIGH (ref 4.0–10.5)

## 2018-01-18 LAB — RAPID URINE DRUG SCREEN, HOSP PERFORMED
Amphetamines: NOT DETECTED
Barbiturates: NOT DETECTED
Benzodiazepines: NOT DETECTED
Cocaine: POSITIVE — AB
Opiates: NOT DETECTED
Tetrahydrocannabinol: NOT DETECTED

## 2018-01-18 LAB — HIV ANTIBODY (ROUTINE TESTING W REFLEX): HIV Screen 4th Generation wRfx: NONREACTIVE

## 2018-01-18 LAB — I-STAT CG4 LACTIC ACID, ED: Lactic Acid, Venous: 1.77 mmol/L (ref 0.5–1.9)

## 2018-01-18 MED ORDER — ORAL CARE MOUTH RINSE: 15.0000 mL | Freq: Two times a day (BID) | OROMUCOSAL | Status: DC

## 2018-01-18 MED ORDER — ASPIRIN 300 MG RE SUPP
300.0000 mg | Freq: Every day | RECTAL | Status: DC
  Administered 2018-01-18: 300 mg via RECTAL
  Filled 2018-01-18 (×2): qty 1

## 2018-01-18 MED ORDER — ENOXAPARIN SODIUM 40 MG/0.4ML ~~LOC~~ SOLN
40.0000 mg | SUBCUTANEOUS | Status: DC
  Administered 2018-01-18 – 2018-01-22 (×5): 40 mg via SUBCUTANEOUS
  Filled 2018-01-18 (×5): qty 0.4

## 2018-01-18 MED ORDER — ASPIRIN 325 MG PO TABS: 325.0000 mg | ORAL_TABLET | Freq: Every day | ORAL | Status: DC

## 2018-01-18 MED ORDER — THIAMINE HCL 100 MG/ML IJ SOLN
Freq: Once | INTRAVENOUS | Status: AC
  Administered 2018-01-18: 03:00:00 via INTRAVENOUS
  Filled 2018-01-18: qty 1000

## 2018-01-18 MED ORDER — STROKE: EARLY STAGES OF RECOVERY BOOK
Freq: Once | Status: AC
  Administered 2018-01-19: 06:00:00
  Filled 2018-01-18 (×2): qty 1

## 2018-01-18 MED ORDER — FOLIC ACID 1 MG PO TABS
1.0000 mg | ORAL_TABLET | Freq: Every day | ORAL | Status: DC
  Administered 2018-01-18 – 2018-01-22 (×5): 1 mg via ORAL
  Filled 2018-01-18 (×5): qty 1

## 2018-01-18 MED ORDER — SODIUM CHLORIDE 0.9 % IV SOLN: 1.0000 g | INTRAVENOUS | Status: DC

## 2018-01-18 MED ORDER — SODIUM CHLORIDE 0.9 % IV SOLN
500.0000 mg | INTRAVENOUS | Status: DC
  Administered 2018-01-18 – 2018-01-20 (×3): 500 mg via INTRAVENOUS
  Filled 2018-01-18 (×3): qty 500

## 2018-01-18 MED ORDER — SODIUM CHLORIDE 0.9 % IV SOLN
INTRAVENOUS | Status: DC
  Administered 2018-01-18 – 2018-01-21 (×5): via INTRAVENOUS

## 2018-01-18 MED ORDER — SODIUM CHLORIDE 0.9 % IV SOLN
1.5000 g | Freq: Four times a day (QID) | INTRAVENOUS | Status: DC
  Administered 2018-01-18 – 2018-01-20 (×9): 1.5 g via INTRAVENOUS
  Filled 2018-01-18 (×11): qty 1.5

## 2018-01-18 MED ORDER — ATORVASTATIN CALCIUM 40 MG PO TABS
40.0000 mg | ORAL_TABLET | Freq: Every day | ORAL | Status: DC
  Administered 2018-01-19 – 2018-01-21 (×3): 40 mg via ORAL
  Filled 2018-01-18 (×3): qty 1

## 2018-01-18 MED ORDER — VITAMIN B-1 100 MG PO TABS
100.0000 mg | ORAL_TABLET | Freq: Every day | ORAL | Status: DC
  Administered 2018-01-18 – 2018-01-22 (×5): 100 mg via ORAL
  Filled 2018-01-18 (×5): qty 1
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
## 2018-01-18 NOTE — Progress Notes (Signed)
[facility] PROGRESS NOTE    Progress Note  Cody [name]  ZOX:[id number] DOB: 14-Dec-1960 DOA: 01/17/2018 PCP: Marcine Matar, MD     Brief Narrative:   Cody [name] is an 57 y.o. male past medical history significant for chronic pain, alcohol abuse and gout who was brought in by family members as he was found to be encephalopathic, family relate that he had been drinking beer on the day of admission no loss of control of sphincters in the ED was found with a temperature of 101.7, with a mild leukocytosis, new mild acute renal failure and chest x-ray that showed bilateral basilar infiltrates  Assessment/Plan:   Acute metabolic encephalopathy: Likely multifactorial in the setting of suspected carbon monoxide poisoning and community-acquired pneumonia versus aspiration pneumonia and possibly polypharmacy as patient is on multiple medications that could affect his mentation. Started on oxygen on admission with empiric antibiotics and steroids due to his history of COPD MRI is pending.  Acute respiratory failure with hypoxia/ Bilateral pulmonary infiltrates on chest x-ray: Due to pneumonia, with bilateral infiltrates on chest x-ray hypoxic fever and mild leukocytosis. Started on Rocephin and azithromycin, will discontinue Rocephin and start Unasyn due to the high probability of aspiration pneumonia.  GERD: Continue PPI.  Tobacco use: Continue nicotine patch.  Suspect alcohol abuse: No signs of withdrawal continue to monitor with Ativan protocol.  Microcytic anemia With a suspected history of alcohol abuse start thiamine, folate.    DVT prophylaxis: lovenox Family Communication:none Disposition Plan/Barrier to D/C: unable to determine Code Status:     Code Status Orders  (From admission, onward)        Start     Ordered   01/18/18 0201  Full code  Continuous     01/18/18 0201    Code Status History    This patient has a current code status but no historical  code status.        IV Access:    Peripheral IV   Procedures and diagnostic studies:   Dg Chest 2 View  Result Date: 01/17/2018 CLINICAL DATA:  Cough.  Altered mental status. EXAM: CHEST - 2 VIEW COMPARISON:  Chest x-ray dated October 18, 2015. FINDINGS: The heart size and mediastinal contours are within normal limits. Normal pulmonary vascularity. Low lung volumes with bibasilar patchy opacities. No pleural effusion or pneumothorax. No acute osseous abnormality. IMPRESSION: 1. Patchy bibasilar atelectasis versus infiltrates. Electronically Signed   By: Obie Dredge M.D.   On: 01/17/2018 22:31   Ct Head Wo Contrast  Result Date: 01/17/2018 CLINICAL DATA:  Altered mental status. EXAM: CT HEAD WITHOUT CONTRAST TECHNIQUE: Contiguous axial images were obtained from the base of the skull through the vertex without intravenous contrast. COMPARISON:  CT head dated October 18, 2015. FINDINGS: Brain: There is subtle hypodensity within the region of the bilateral globus pallidi. No evidence of acute infarction, hemorrhage, hydrocephalus, extra-axial collection or mass lesion/mass effect. Vascular: Atherosclerotic vascular calcification of the carotid siphons. No hyperdense vessel. Skull: Normal. Negative for fracture or focal lesion. Sinuses/Orbits: No acute finding. Other: None. IMPRESSION: 1. Subtle hypodensity within the region of the bilateral globus pallidi, which can be seen with toxic encephalopathies such as carbon monoxide poisoning. Consider MRI for further evaluation. Electronically Signed   By: Obie Dredge M.D.   On: 01/17/2018 22:29     Medical Consultants:    None.  Anti-Infectives:   IV Unasyn and azithromycin  Subjective:    Cody [name] patient has no  new complaints been a unable to provide a history of communicate  Objective:    Vitals:   01/18/18 0100 01/18/18 0133 01/18/18 0206 01/18/18 0444  BP: 112/70 102/86  124/69  Pulse: 70 (!) 59  (!) 52  Resp: 13 16  18     Temp:  99.3 F (37.4 C)  99.2 F (37.3 C)  TempSrc:  Oral  Oral  SpO2: 95% 95%  92%  Weight:   79 kg (174 lb 1.6 oz)   Height:   5\' 11"  (1.803 m)    No intake or output data in the 24 hours ending 01/18/18 0722 Filed Weights   01/17/18 2142 01/18/18 0206  Weight: 79.4 kg (175 lb) 79 kg (174 lb 1.6 oz)    Exam: General exam: In no acute distress. Respiratory system: Good air movement and clear to auscultation. Cardiovascular system: S1 & S2 heard, RRR. No JVD. Gastrointestinal system: Abdomen is nondistended, soft and nontender.  Central nervous system: He is only responsive to command and is able to move all 4 extremities without any difficulties extraocular movements are intact facial movements are intact deep tendon reflexes are 2+ and symmetrical bilaterally. Extremities: No pedal edema. Skin: No rashes, lesions or ulcers    Data Reviewed:    Labs: Basic Metabolic Panel: Recent Labs  Lab 01/17/18 2224 01/18/18 0526  NA 139 140  K 4.1 4.1  CL 105 107  CO2 22 26  GLUCOSE 89 94  BUN 18 16  CREATININE 1.20 1.22  CALCIUM 9.1 8.4*   GFR Estimated Creatinine Clearance: 70.3 mL/min (by C-G formula based on SCr of 1.22 mg/dL). Liver Function Tests: Recent Labs  Lab 01/17/18 2224 01/18/18 0526  AST 51* 43*  ALT 46 36  ALKPHOS 72 55  BILITOT 0.7 0.9  PROT 8.1 6.6  ALBUMIN 4.3 3.5   No results for input(s): LIPASE, AMYLASE in the last 168 hours. No results for input(s): AMMONIA in the last 168 hours. Coagulation profile No results for input(s): INR, PROTIME in the last 168 hours.  CBC: Recent Labs  Lab 01/17/18 2224 01/18/18 0526  WBC 12.3* 13.4*  NEUTROABS 10.5* 11.0*  HGB 12.6* 11.8*  HCT 37.6* 36.2*  MCV 106.8* 106.8*  PLT 189 208   Cardiac Enzymes: No results for input(s): CKTOTAL, CKMB, CKMBINDEX, TROPONINI in the last 168 hours. BNP (last 3 results) No results for input(s): PROBNP in the last 8760 hours. CBG: No results for input(s):  GLUCAP in the last 168 hours. D-Dimer: No results for input(s): DDIMER in the last 72 hours. Hgb A1c: No results for input(s): HGBA1C in the last 72 hours. Lipid Profile: No results for input(s): CHOL, HDL, LDLCALC, TRIG, CHOLHDL, LDLDIRECT in the last 72 hours. Thyroid function studies: No results for input(s): TSH, T4TOTAL, T3FREE, THYROIDAB in the last 72 hours.  Invalid input(s): FREET3 Anemia work up: No results for input(s): VITAMINB12, FOLATE, FERRITIN, TIBC, IRON, RETICCTPCT in the last 72 hours. Sepsis Labs: Recent Labs  Lab 01/17/18 2224 01/18/18 0025 01/18/18 0526  WBC 12.3*  --  13.4*  LATICACIDVEN  --  1.77  --    Microbiology No results found for this or any previous visit (from the past 240 hour(s)).   Medications:   . acetaminophen  650 mg Oral Once  . enoxaparin (LOVENOX) injection  40 mg Subcutaneous Q24H  . thiamine  100 mg Oral Daily   Continuous Infusions: . sodium chloride 125 mL/hr at 01/18/18 0449  . azithromycin    . cefTRIAXone (  ROCEPHIN)  IV        LOS: 0 days   Marinda Elk  [facility] Pager [phone]  *Please refer to [email or web address], password [email or web address] to get updated schedule on who will round on this patient, as hospitalists switch teams weekly. If 7PM-7AM, please contact night-coverage at [email or web address], password [email or web address] for any overnight needs.  01/18/2018, 7:22 AM

## 2018-01-18 NOTE — Progress Notes (Signed)
MD on call paged regarding q2 neuro and vital sign orders and higher level of care required for this patient. Will continue to monitor closely. Mardene CelesteAsaro, Falisa Lamora I
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
## 2018-01-19 ENCOUNTER — Inpatient Hospital Stay (HOSPITAL_COMMUNITY): Payer: Medicaid Other

## 2018-01-19 DIAGNOSIS — I371 Nonrheumatic pulmonary valve insufficiency: Secondary | ICD-10-CM

## 2018-01-19 LAB — STREP PNEUMONIAE URINARY ANTIGEN: STREP PNEUMO URINARY ANTIGEN: NEGATIVE

## 2018-01-19 LAB — ECHOCARDIOGRAM COMPLETE
Height: 71 in
Weight: 2785.6 oz

## 2018-01-19 LAB — LIPID PANEL
CHOLESTEROL: 152 mg/dL (ref 0–200)
HDL: 36 mg/dL — AB (ref >40)
LDL Cholesterol: 100 mg/dL — ABNORMAL HIGH (ref 0–99)
TRIGLYCERIDES: 79 mg/dL (ref <150)
Total CHOL/HDL Ratio: 4.2 RATIO
VLDL: 16 mg/dL (ref 0–40)

## 2018-01-19 LAB — HEMOGLOBIN A1C
HEMOGLOBIN A1C: 4.8 % (ref 4.8–5.6)
Mean Plasma Glucose: 91.06 mg/dL

## 2018-01-19 LAB — MRSA PCR SCREENING: MRSA BY PCR: NEGATIVE
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
## 2018-01-19 NOTE — Progress Notes (Addendum)
[facility] PROGRESS NOTE    Progress Note  Cody [name]  ZOX:[id number] DOB: 12/17/1960 DOA: 01/17/2018 PCP: Marcine MatarJohnson, Deborah B, MD     Brief Narrative:   Cody [name] is an 57 y.o. male past medical history significant for chronic pain, alcohol abuse and gout who was brought in by family members as he was found to be encephalopathic, family relate that he had been drinking beer on the day of admission no loss of control of sphincters in the ED was found with a temperature of 101.7, with a mild leukocytosis, new mild acute renal failure and chest x-ray that showed bilateral basilar infiltrates  Assessment/Plan:   Acute metabolic encephalopathy: Likely multifactorial in the setting of suspected carbon monoxide poisoning, And/or community-acquired pneumonia versus aspiration pneumonia and possibly polypharmacy as patient is on multiple medications that could affect his mentation. Started on oxygen on admission with empiric antibiotics and steroids due to his history of COPD MRI is showed what appeared to be multiple stroke at the cerebellum and bilateral globus pallidus and bilateral hippocamp. Discussed the case with neurology as he does not think this is a CVA, MRI points  and history towards  toxicity.  Acute respiratory failure with hypoxia/ Bilateral pulmonary infiltrates on chest x-ray: Due to pneumonia, with bilateral infiltrates on chest x-ray hypoxic fever and mild leukocytosis. Now on Unasyn and azithromycin has remained afebrile with no leukocytosis.  GERD: Continue PPI.  Tobacco use: Continue nicotine patch.  Suspect alcohol abuse: No signs of withdrawal continue to monitor with Ativan protocol.  Microcytic anemia With a suspected history of alcohol abuse start thiamine, folate.    DVT prophylaxis: lovenox Family Communication:none Disposition Plan/Barrier to D/C: unable to determine Code Status:     Code Status Orders  (From admission, onward)        Start     Ordered   01/18/18 0201  Full code  Continuous     01/18/18 0201    Code Status History    This patient has a current code status but no historical code status.        IV Access:    Peripheral IV   Procedures and diagnostic studies:   Dg Chest 2 View  Result Date: 01/17/2018 CLINICAL DATA:  Cough.  Altered mental status. EXAM: CHEST - 2 VIEW COMPARISON:  Chest x-ray dated October 18, 2015. FINDINGS: The heart size and mediastinal contours are within normal limits. Normal pulmonary vascularity. Low lung volumes with bibasilar patchy opacities. No pleural effusion or pneumothorax. No acute osseous abnormality. IMPRESSION: 1. Patchy bibasilar atelectasis versus infiltrates. Electronically Signed   By: Obie DredgeWilliam T Derry M.D.   On: 01/17/2018 22:31   Ct Head Wo Contrast  Result Date: 01/17/2018 CLINICAL DATA:  Altered mental status. EXAM: CT HEAD WITHOUT CONTRAST TECHNIQUE: Contiguous axial images were obtained from the base of the skull through the vertex without intravenous contrast. COMPARISON:  CT head dated October 18, 2015. FINDINGS: [name]: There is subtle hypodensity within the region of the bilateral globus pallidi. No evidence of acute infarction, hemorrhage, hydrocephalus, extra-axial collection or mass lesion/mass effect. Vascular: Atherosclerotic vascular calcification of the carotid siphons. No hyperdense vessel. Skull: Normal. Negative for fracture or focal lesion. Sinuses/Orbits: No acute finding. Other: None. IMPRESSION: 1. Subtle hypodensity within the region of the bilateral globus pallidi, which can be seen with toxic encephalopathies such as carbon monoxide poisoning. Consider MRI for further evaluation. Electronically Signed   By: Obie DredgeWilliam T Derry M.D.   On: 01/17/2018 22:29  Cody [name] 61 Contrast  Result Date: 01/18/2018 CLINICAL DATA:  58 year old male with altered mental status. EXAM: MRI HEAD WITHOUT CONTRAST TECHNIQUE: Multiplanar, multiecho pulse sequences of the  [name] and surrounding structures were obtained without intravenous contrast. COMPARISON:  Head CT without contrast 01/17/2018 and earlier. FINDINGS: Study is intermittently degraded by motion artifact despite repeated imaging attempts. [name]: There is symmetric restricted diffusion in the bilateral globus pallidus, and bilateral hippocampal formations. Confluent associated T2 and FLAIR hyperintensity in those regions. No associated hemorrhage or mass effect. Additionally, there are two more patchy and linear foci of restricted diffusion in the left caudate nucleus (series 4, image 30), and the left lateral cerebellar hemisphere (series 6, image 9). Similar to slightly less pronounced T2 and FLAIR hyperintensity in those regions. No other diffusion restriction. Underlying gray and white matter signal appears normal elsewhere in the [name]. No chronic encephalomalacia or chronic cerebral blood products identified. No midline shift, mass effect, evidence of mass lesion, ventriculomegaly, extra-axial collection or acute intracranial hemorrhage. Cervicomedullary junction and pituitary are within normal limits. Vascular: Major intracranial vascular flow voids are preserved. Skull and upper cervical spine: Negative visible cervical spine. Visualized bone marrow signal is within normal limits. Sinuses/Orbits: Grossly normal orbits soft tissues. Paranasal sinuses and mastoids are stable and well pneumatized. Other: Visible internal auditory structures appear normal. Scalp and face soft tissues appear negative. IMPRESSION: 1. Striking edema and diffusion restriction in the bilateral globus pallidus and bilateral hippocampi. Top differential considerations include cxcitotoxicity due to drug overdose (such as opiates) versus other toxic exposure (such as carbon monoxide) with subsequent hypoxic-ischemic injury. 2. Two superimposed small foci of restricted diffusion in the left caudate and the left cerebellum resemble typical  acute lacunar infarcts. 3. No associated hemorrhage or mass effect. Electronically Signed   By: Odessa Fleming M.D.   On: 01/18/2018 12:20     Medical Consultants:    None.  Anti-Infectives:   IV Unasyn and azithromycin  Subjective:    Kyheem L Rinehimer patient has no new complaints been a unable to provide a history of communicate  Objective:    Vitals:   01/19/18 0342 01/19/18 0400 01/19/18 0500 01/19/18 0600  BP:  (!) 151/71 (!) 154/80 (!) 161/74  Pulse:  73 (!) 48 (!) 51  Resp:  15 13 11   Temp: (!) 97.3 F (36.3 C)     TempSrc: Oral     SpO2:  98% 97% 98%  Weight:      Height:        Intake/Output Summary (Last 24 hours) at 01/19/2018 0706 Last data filed at 01/19/2018 0600 Gross per 24 hour  Intake 3547.91 ml  Output 1650 ml  Net 1897.91 ml   Filed Weights   01/17/18 2142 01/18/18 0206  Weight: 79.4 kg (175 lb) 79 kg (174 lb 1.6 oz)    Exam: General exam: In no acute distress. Respiratory system: Good air movement and clear to auscultation. Cardiovascular system: S1 & S2 heard, RRR. No JVD. Gastrointestinal system: Abdomen is nondistended, soft and nontender.  Central nervous system: He is awake alert and oriented x2 3-12 are grossly intact sensation is intact throughout muscle strength is 5 out of 5 in all 4 extremities deep tendon reflexes are present sensation seems to be intact. Extremities: No pedal edema. Skin: No rashes, lesions or ulcers    Data Reviewed:    Labs: Basic Metabolic Panel: Recent Labs  Lab 01/17/18 2224 01/18/18 0526  NA 139 140  K 4.1 4.1  CL 105 107  CO2 22 26  GLUCOSE 89 94  BUN 18 16  CREATININE 1.20 1.22  CALCIUM 9.1 8.4*   GFR Estimated Creatinine Clearance: 70.3 mL/min (by C-G formula based on SCr of 1.22 mg/dL). Liver Function Tests: Recent Labs  Lab 01/17/18 2224 01/18/18 0526  AST 51* 43*  ALT 46 36  ALKPHOS 72 55  BILITOT 0.7 0.9  PROT 8.1 6.6  ALBUMIN 4.3 3.5   No results for input(s): LIPASE, AMYLASE in  the last 168 hours. No results for input(s): AMMONIA in the last 168 hours. Coagulation profile No results for input(s): INR, PROTIME in the last 168 hours.  CBC: Recent Labs  Lab 01/17/18 2224 01/18/18 0526  WBC 12.3* 13.4*  NEUTROABS 10.5* 11.0*  HGB 12.6* 11.8*  HCT 37.6* 36.2*  MCV 106.8* 106.8*  PLT 189 208   Cardiac Enzymes: No results for input(s): CKTOTAL, CKMB, CKMBINDEX, TROPONINI in the last 168 hours. BNP (last 3 results) No results for input(s): PROBNP in the last 8760 hours. CBG: No results for input(s): GLUCAP in the last 168 hours. D-Dimer: No results for input(s): DDIMER in the last 72 hours. Hgb A1c: No results for input(s): HGBA1C in the last 72 hours. Lipid Profile: Recent Labs    01/19/18 0324  CHOL 152  HDL 36*  LDLCALC 100*  TRIG 79  CHOLHDL 4.2   Thyroid function studies: No results for input(s): TSH, T4TOTAL, T3FREE, THYROIDAB in the last 72 hours.  Invalid input(s): FREET3 Anemia work up: No results for input(s): VITAMINB12, FOLATE, FERRITIN, TIBC, IRON, RETICCTPCT in the last 72 hours. Sepsis Labs: Recent Labs  Lab 01/17/18 2224 01/18/18 0025 01/18/18 0526  WBC 12.3*  --  13.4*  LATICACIDVEN  --  1.77  --    Microbiology Recent Results (from the past 240 hour(s))  Culture, blood (routine x 2) Call MD if unable to obtain prior to antibiotics being given     Status: None (Preliminary result)   Collection Time: 01/18/18  2:33 AM  Result Value Ref Range Status   Specimen Description   Final    BLOOD RIGHT ARM Performed at [facility], [address], [place], [place] [address]    Special Requests   Final    BOTTLES DRAWN AEROBIC AND ANAEROBIC Blood Culture adequate volume Performed at [facility], [address] [place], [place] [address]    Culture PENDING  Incomplete   Report Status PENDING  Incomplete     Medications:   . acetaminophen  650 mg Oral Once  . aspirin  300 mg Rectal Daily   Or    . aspirin  325 mg Oral Daily  . atorvastatin  40 mg Oral q1800  . enoxaparin (LOVENOX) injection  40 mg Subcutaneous Q24H  . folic acid  1 mg Oral Daily  . mouth rinse  15 mL Mouth Rinse q12n4p  . mouth rinse  15 mL Mouth Rinse q12n4p  . thiamine  100 mg Oral Daily   Continuous Infusions: . sodium chloride 125 mL/hr at 01/19/18 0600  . ampicillin-sulbactam (UNASYN) IV Stopped (01/19/18 0154)  . azithromycin Stopped (01/19/18 0000)      LOS: 1 day   Marinda Elk  [facility] Pager [phone]  *Please refer to [email or web address], password [email or web address] to get updated schedule on who will round on this patient, as hospitalists switch teams weekly. If 7PM-7AM, please contact night-coverage at [email or web address], password [email or web address] for any overnight needs.  01/19/2018, 7:06 AM

## 2018-01-19 NOTE — Progress Notes (Signed)
MD paged to make aware pt passed bedside swallow eval. Awaiting call back. Will continue to monitor.
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
## 2018-01-19 NOTE — Progress Notes (Signed)
  Echocardiogram 2D Echocardiogram has been performed.  Marlita Keil T Korrey Schleicher 01/19/2018, 11:19 AM

## 2018-01-20 MED ORDER — AMOXICILLIN-POT CLAVULANATE 875-125 MG PO TABS: 1.0000 | ORAL_TABLET | Freq: Two times a day (BID) | ORAL | 0 refills | Status: DC

## 2018-01-20 MED ORDER — AMOXICILLIN-POT CLAVULANATE 875-125 MG PO TABS
1.0000 | ORAL_TABLET | Freq: Two times a day (BID) | ORAL | Status: DC
  Administered 2018-01-20 – 2018-01-22 (×5): 1 via ORAL
  Filled 2018-01-20 (×5): qty 1

## 2018-01-20 NOTE — NC FL2 (Signed)
[place] MEDICAID FL2 LEVEL OF CARE SCREENING TOOL     IDENTIFICATION  Patient Name: Cody [name] Birthdate: Sep 28, 1960 Sex: male Admission Date (Current Location): 01/17/2018  [facility] and [place] Number:  Producer, television/film/video and Address:  [facility],  [address] [place], [place] [address]      Provider Number: [id number]  Attending Physician Name and Address:  Marinda Elk, MD  Relative Name and Phone Number:       Current Level of Care: Hospital Recommended Level of Care: Skilled Nursing Facility Prior Approval Number:    Date Approved/Denied:   PASRR Number: [id number]  Discharge Plan: SNF    Current Diagnoses: Patient Active Problem List   Diagnosis Date Noted  . Acute metabolic encephalopathy 01/18/2018  . Community acquired pneumonia 01/18/2018  . Bilateral pulmonary infiltrates on chest x-ray 01/18/2018  . Suspected carbon monoxide poisoning 01/18/2018  . Macrocytic anemia 01/18/2018  . Severe sepsis (HCC) 01/18/2018  . Renal insufficiency 11/15/2017  . Post-traumatic osteoarthritis of both hips 11/15/2017  . Chronic midline low back pain without sciatica 11/15/2017  . Weight loss, unintentional 10/07/2017  . Immunization due 06/06/2017  . Tobacco use 06/06/2017  . Posterior tibial tendinitis, left leg 04/25/2017  . Acute seasonal allergic rhinitis due to pollen 11/22/2016  . Poor dentition 11/22/2016  . Cervical radicular pain 09/27/2016  . Uncomplicated alcohol dependence (HCC) 09/27/2016  . Tibia/fibula fracture, left, open type III, with routine healing, subsequent encounter 09/10/2016  . S/P ORIF (open reduction internal fixation) fracture 10/19/2015  . Chronic pain of right knee 12/03/2014  . Esophageal reflux 12/03/2014  . Smoking 12/03/2014    Orientation RESPIRATION BLADDER Height & Weight     Self, Situation, Place, Time  Normal Incontinent Weight: 174 lb 1.6 oz (79 kg) Height:  5\' 11"  (180.3 cm)  BEHAVIORAL  SYMPTOMS/MOOD NEUROLOGICAL BOWEL NUTRITION STATUS      Incontinent Diet(low sodium heart healthy)  AMBULATORY STATUS COMMUNICATION OF NEEDS Skin   Limited Assist Verbally (blister left foot, foam dressing)                       Personal Care Assistance Level of Assistance  Bathing, Feeding, Dressing Bathing Assistance: Limited assistance Feeding assistance: Independent Dressing Assistance: Limited assistance     Functional Limitations Info  Sight, Hearing, Speech Sight Info: Adequate Hearing Info: Adequate Speech Info: Adequate    SPECIAL CARE FACTORS FREQUENCY  PT (By licensed PT)     PT Frequency: 5x              Contractures Contractures Info: Not present    Additional Factors Info  Code Status, Allergies Code Status Info: full code Allergies Info: nka           Current Medications (01/20/2018):  This is the current hospital active medication list Current Facility-Administered Medications  Medication Dose Route Frequency Provider Last Rate Last Dose  . 0.9 %  sodium chloride infusion   Intravenous Continuous Marinda Elk, MD 125 mL/hr at 01/19/18 0754    . acetaminophen (TYLENOL) tablet 650 mg  650 mg Oral Once David Stall, Darin Engels, MD      . amoxicillin-clavulanate (AUGMENTIN) 875-125 MG per tablet 1 tablet  1 tablet Oral Q12H Marinda Elk, MD   1 tablet at 01/20/18 1021  . atorvastatin (LIPITOR) tablet 40 mg  40 mg Oral q1800 Marinda Elk, MD   40 mg at 01/19/18 1722  . azithromycin (ZITHROMAX) 500 mg  in sodium chloride 0.9 % 250 mL IVPB  500 mg Intravenous Q24H Marinda ElkFeliz Ortiz, Abraham, MD   Stopped at 01/19/18 2319  . enoxaparin (LOVENOX) injection 40 mg  40 mg Subcutaneous Q24H Marinda ElkFeliz Ortiz, Abraham, MD   40 mg at 01/20/18 1021  . folic acid (FOLVITE) tablet 1 mg  1 mg Oral Daily Marinda ElkFeliz Ortiz, Abraham, MD   1 mg at 01/20/18 1021  . MEDLINE mouth rinse  15 mL Mouth Rinse q12n4p Marinda ElkFeliz Ortiz, Abraham, MD      . MEDLINE mouth rinse  15 mL  Mouth Rinse q12n4p Marinda ElkFeliz Ortiz, Abraham, MD      . thiamine (VITAMIN B-1) tablet 100 mg  100 mg Oral Daily Marinda ElkFeliz Ortiz, Abraham, MD   100 mg at 01/20/18 1021     Discharge Medications: Please see discharge summary for a list of discharge medications.  Relevant Imaging Results:  Relevant Lab Results:   Additional Information SS# [id number]  Nelwyn SalisburyMeghan R Rockell Faulks, LCSW

## 2018-01-20 NOTE — Care Management Note (Signed)
Case Management Note  Patient Details  Name: Cody [name] MRN: [id number] Date of Birth: 07/30/1961  Subjective/Objective:   57 yo admitted with Acute metabolic encephalopathy                 Action/Plan: From home with mother. [facility] Mother also requesting 3in1 and RW. [facility] rep alerted of orders and referral.  Expected Discharge Date:  01/20/18               Expected Discharge Plan:  Home w Home Health Services  In-House Referral:     Discharge planning Services  CM Consult  Post Acute Care Choice:  Durable Medical Equipment Choice offered to:  Parent  DME Arranged:  Dan HumphreysWalker rolling, 3-N-1 DME Agency:  [facility]  HH Arranged:  PT, OT [facility]  [facility]  Status of Service:  In process, will continue to follow  If discussed at Long Length of Stay Meetings, dates discussed:    Additional CommentsBartholome Bill:  Jeovani Weisenburger H, RN 01/20/2018, 9:55 AM  [phone]
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
## 2018-01-20 NOTE — Discharge Summary (Signed)
Physician Discharge Summary  Cody [name]:[id number] DOB: Jul 17, 1961 DOA: 01/17/2018  PCP: Marcine Matar, MD  Admit date: 01/17/2018 Discharge date: 01/20/2018  Admitted From: Home Disposition:  Home  Recommendations for Outpatient Follow-up:  1. Follow up with PCP in 1-2 weeks, colonoscopy as an outpatient. 2. [facility] therapy to follow-up at home.  Home Health:yes Equipment/Devices:none  Discharge Condition:stable CODE STATUS:full Diet recommendation: Heart Healthy   Brief/Interim Summary: 57 y.o. male past medical history significant for chronic pain, alcohol abuse and gout who was brought in by family members as he was found to be encephalopathic, family relate that he had been drinking beer on the day of admission no loss of control of sphincters in the ED was found with a temperature of 101.7, with a mild leukocytosis, new mild acute renal failure and chest x-ray that showed bilateral basilar infiltrates  Discharge Diagnoses:  Principal Problem:   Acute metabolic encephalopathy Active Problems:   Esophageal reflux   Tobacco use   Community acquired pneumonia   Bilateral pulmonary infiltrates on chest x-ray   Suspected carbon monoxide poisoning   Macrocytic anemia   Severe sepsis (HCC)  Acute metabolic encephalopathy multifactorial likely due to aspiration pneumonia and carbon monoxide poisoning: Patient is also on multiple medications and polypharmacy could have played a role, but on admission he was started on nasal cannula high flow and empiric antibiotics. His confusion resolved and was able to participate with physical therapy who recommended home health PT  Acute respiratory failure with hypoxia due to aspiration pneumonia: Chest x-ray showed bilateral infiltrates he was started on Unasyn and azithromycin by the next day he was leukocytosis improved he was changed to Augmentin which she will continue as an outpatient.   GERD: Continue famotidine.  Tobacco  abuse: Counseling.  Suspect alcohol use: He was counseled he had no signs of withdrawal.  Microcytic anemia: We will need to follow-up with PCP as an outpatient patient has never had a colonoscopy.  No signs of overt bleeding in house.  Discharge Instructions  Discharge Instructions    Diet - low sodium heart healthy   Complete by:  As directed    Increase activity slowly   Complete by:  As directed      Allergies as of 01/20/2018   No Known Allergies     Medication List    STOP taking these medications   naproxen 500 MG tablet Commonly known as:  NAPROSYN     TAKE these medications   amoxicillin-clavulanate 875-125 MG tablet Commonly known as:  AUGMENTIN Take 1 tablet by mouth every 12 (twelve) hours for 4 days.   benzonatate 100 MG capsule Commonly known as:  TESSALON Take 1 capsule (100 mg total) by mouth 2 (two) times daily as needed for cough.   Cane Misc 1 each by Does not apply route daily.   cetirizine 10 MG tablet Commonly known as:  ZYRTEC Take 1 tablet (10 mg total) by mouth daily.   diclofenac sodium 1 % Gel Commonly known as:  VOLTAREN Apply 2 g topically 4 (four) times daily.   diphenhydrAMINE 25 MG tablet Commonly known as:  BENADRYL Take 1 tablet (25 mg total) by mouth every 6 (six) hours.   famotidine 20 MG tablet Commonly known as:  PEPCID Take 1 tablet (20 mg total) by mouth 2 (two) times daily.   methocarbamol 750 MG tablet Commonly known as:  ROBAXIN Take 1 tablet (750 mg total) by mouth 4 (four) times daily.   MULTIVITAMIN  ADULT Tabs Take 1 tablet by mouth daily.   omeprazole 20 MG capsule Commonly known as:  PRILOSEC Take 1 capsule (20 mg total) by mouth 2 (two) times daily before a meal.   thiamine 100 MG tablet Take 1 tablet (100 mg total) by mouth daily.       No Known Allergies  Consultations:  None   Procedures/Studies: Dg Chest 2 View  Result Date: 01/17/2018 CLINICAL DATA:  Cough.  Altered mental status.  EXAM: CHEST - 2 VIEW COMPARISON:  Chest x-ray dated October 18, 2015. FINDINGS: The heart size and mediastinal contours are within normal limits. Normal pulmonary vascularity. Low lung volumes with bibasilar patchy opacities. No pleural effusion or pneumothorax. No acute osseous abnormality. IMPRESSION: 1. Patchy bibasilar atelectasis versus infiltrates. Electronically Signed   By: Obie Dredge M.D.   On: 01/17/2018 22:31   Ct Head Wo Contrast  Result Date: 01/17/2018 CLINICAL DATA:  Altered mental status. EXAM: CT HEAD WITHOUT CONTRAST TECHNIQUE: Contiguous axial images were obtained from the base of the skull through the vertex without intravenous contrast. COMPARISON:  CT head dated October 18, 2015. FINDINGS: Brain: There is subtle hypodensity within the region of the bilateral globus pallidi. No evidence of acute infarction, hemorrhage, hydrocephalus, extra-axial collection or mass lesion/mass effect. Vascular: Atherosclerotic vascular calcification of the carotid siphons. No hyperdense vessel. Skull: Normal. Negative for fracture or focal lesion. Sinuses/Orbits: No acute finding. Other: None. IMPRESSION: 1. Subtle hypodensity within the region of the bilateral globus pallidi, which can be seen with toxic encephalopathies such as carbon monoxide poisoning. Consider MRI for further evaluation. Electronically Signed   By: Obie Dredge M.D.   On: 01/17/2018 22:29   Mr Brain Wo Contrast  Result Date: 01/18/2018 CLINICAL DATA:  57 year old male with altered mental status. EXAM: MRI HEAD WITHOUT CONTRAST TECHNIQUE: Multiplanar, multiecho pulse sequences of the brain and surrounding structures were obtained without intravenous contrast. COMPARISON:  Head CT without contrast 01/17/2018 and earlier. FINDINGS: Study is intermittently degraded by motion artifact despite repeated imaging attempts. Brain: There is symmetric restricted diffusion in the bilateral globus pallidus, and bilateral hippocampal formations.  Confluent associated T2 and FLAIR hyperintensity in those regions. No associated hemorrhage or mass effect. Additionally, there are two more patchy and linear foci of restricted diffusion in the left caudate nucleus (series 4, image 30), and the left lateral cerebellar hemisphere (series 6, image 9). Similar to slightly less pronounced T2 and FLAIR hyperintensity in those regions. No other diffusion restriction. Underlying gray and white matter signal appears normal elsewhere in the brain. No chronic encephalomalacia or chronic cerebral blood products identified. No midline shift, mass effect, evidence of mass lesion, ventriculomegaly, extra-axial collection or acute intracranial hemorrhage. Cervicomedullary junction and pituitary are within normal limits. Vascular: Major intracranial vascular flow voids are preserved. Skull and upper cervical spine: Negative visible cervical spine. Visualized bone marrow signal is within normal limits. Sinuses/Orbits: Grossly normal orbits soft tissues. Paranasal sinuses and mastoids are stable and well pneumatized. Other: Visible internal auditory structures appear normal. Scalp and face soft tissues appear negative. IMPRESSION: 1. Striking edema and diffusion restriction in the bilateral globus pallidus and bilateral hippocampi. Top differential considerations include cxcitotoxicity due to drug overdose (such as opiates) versus other toxic exposure (such as carbon monoxide) with subsequent hypoxic-ischemic injury. 2. Two superimposed small foci of restricted diffusion in the left caudate and the left cerebellum resemble typical acute lacunar infarcts. 3. No associated hemorrhage or mass effect. Electronically Signed   By: Rexene Edison  Margo AyeHall M.D.   On: 01/18/2018 12:20    Subjective: No complains  Discharge Exam: Vitals:   01/19/18 2125 01/20/18 0450  BP: 134/75 130/81  Pulse: (!) 50 (!) 52  Resp: 16 16  Temp: 98.6 F (37 C) 99.4 F (37.4 C)  SpO2: 95% 96%   Vitals:    01/19/18 1200 01/19/18 1339 01/19/18 2125 01/20/18 0450  BP: (!) 159/75 136/79 134/75 130/81  Pulse: 69 65 (!) 50 (!) 52  Resp: (!) 28 16 16 16   Temp: 98.2 F (36.8 C) (!) 100.4 F (38 C) 98.6 F (37 C) 99.4 F (37.4 C)  TempSrc: Oral Oral Oral Oral  SpO2: 92% 94% 95% 96%  Weight:      Height:        General: Pt is alert, awake, not in acute distress Cardiovascular: RRR, S1/S2 +, no rubs, no gallops Respiratory: CTA bilaterally, no wheezing, no rhonchi Abdominal: Soft, NT, ND, bowel sounds + Extremities: no edema, no cyanosis    The results of significant diagnostics from this hospitalization (including imaging, microbiology, ancillary and laboratory) are listed below for reference.     Microbiology: Recent Results (from the past 240 hour(s))  Culture, blood (routine x 2)     Status: None (Preliminary result)   Collection Time: 01/17/18 11:15 PM  Result Value Ref Range Status   Specimen Description   Final    BLOOD RIGHT ANTECUBITAL Performed at [facility], [address] [place], [place] [address]    Special Requests   Final    BOTTLES DRAWN AEROBIC AND ANAEROBIC Blood Culture results may not be optimal due to an excessive volume of blood received in culture bottles Performed at [facility], [address] [place], [place] [address]    Culture   Final    NO GROWTH 1 DAY Performed at [facility], [address] [place], [place] [address]    Report Status PENDING  Incomplete  Culture, blood (routine x 2) Call MD if unable to obtain prior to antibiotics being given     Status: None (Preliminary result)   Collection Time: 01/18/18  2:33 AM  Result Value Ref Range Status   Specimen Description   Final    BLOOD RIGHT ARM Performed at [facility], [address] [place], [place] [address]    Special Requests   Final    BOTTLES DRAWN AEROBIC AND ANAEROBIC Blood Culture adequate volume Performed at [facility], [address] [place], [place] [address]    Culture   Final    NO GROWTH < 24 HOURS Performed at [facility], [address] [place], [place] [address]    Report Status PENDING  Incomplete  MRSA PCR Screening     Status: None   Collection Time: 01/19/18  9:44 AM  Result Value Ref Range Status   MRSA by PCR NEGATIVE NEGATIVE Final    Comment:        The GeneXpert MRSA Assay (FDA approved for NASAL specimens only), is one component of a comprehensive MRSA colonization surveillance program. It is not intended to diagnose MRSA infection nor to guide or monitor treatment for MRSA infections. Performed at [facility], [address] [place], [place] [address]      Labs: BNP (last 3 results) No results for input(s): BNP in the last 8760 hours. Basic Metabolic Panel: Recent Labs  Lab 01/17/18 2224 01/18/18 0526  NA 139  140  K 4.1 4.1  CL 105 107  CO2 22 26  GLUCOSE 89 94  BUN 18 16  CREATININE 1.20 1.22  CALCIUM 9.1 8.4*   Liver Function Tests: Recent Labs  Lab 01/17/18 2224 01/18/18 0526  AST 51* 43*  ALT 46 36  ALKPHOS 72 55  BILITOT 0.7 0.9  PROT 8.1 6.6  ALBUMIN 4.3 3.5   No results for input(s): LIPASE, AMYLASE in the last 168 hours. No results for input(s): AMMONIA in the last 168 hours. CBC: Recent Labs  Lab 01/17/18 2224 01/18/18 0526  WBC 12.3* 13.4*  NEUTROABS 10.5* 11.0*  HGB 12.6* 11.8*  HCT 37.6* 36.2*  MCV 106.8* 106.8*  PLT 189 208   Cardiac Enzymes: No results for input(s): CKTOTAL, CKMB, CKMBINDEX, TROPONINI in the last 168 hours. BNP: Invalid input(s): POCBNP CBG: No results for input(s): GLUCAP in the last 168 hours. D-Dimer No results for input(s): DDIMER in the last 72 hours. Hgb A1c Recent Labs    01/19/18 0324  HGBA1C 4.8   Lipid Profile Recent Labs    01/19/18 0324  CHOL 152  HDL 36*  LDLCALC 100*  TRIG 79  CHOLHDL 4.2   Thyroid function studies No results  for input(s): TSH, T4TOTAL, T3FREE, THYROIDAB in the last 72 hours.  Invalid input(s): FREET3 Anemia work up No results for input(s): VITAMINB12, FOLATE, FERRITIN, TIBC, IRON, RETICCTPCT in the last 72 hours. Urinalysis    Component Value Date/Time   COLORURINE YELLOW 01/18/2018 1600   APPEARANCEUR CLEAR 01/18/2018 1600   LABSPEC 1.019 01/18/2018 1600   PHURINE 5.0 01/18/2018 1600   GLUCOSEU NEGATIVE 01/18/2018 1600   HGBUR NEGATIVE 01/18/2018 1600   BILIRUBINUR NEGATIVE 01/18/2018 1600   KETONESUR 20 (A) 01/18/2018 1600   PROTEINUR NEGATIVE 01/18/2018 1600   UROBILINOGEN 0.2 12/08/2009 2250   NITRITE NEGATIVE 01/18/2018 1600   LEUKOCYTESUR TRACE (A) 01/18/2018 1600   Sepsis Labs Invalid input(s): PROCALCITONIN,  WBC,  LACTICIDVEN Microbiology Recent Results (from the past 240 hour(s))  Culture, blood (routine x 2)     Status: None (Preliminary result)   Collection Time: 01/17/18 11:15 PM  Result Value Ref Range Status   Specimen Description   Final    BLOOD RIGHT ANTECUBITAL Performed at [facility], [address] [place], [place] [address]    Special Requests   Final    BOTTLES DRAWN AEROBIC AND ANAEROBIC Blood Culture results may not be optimal due to an excessive volume of blood received in culture bottles Performed at [facility], [address] [place], [place] [address]    Culture   Final    NO GROWTH 1 DAY Performed at [facility], [address], [place], [place] [address]    Report Status PENDING  Incomplete  Culture, blood (routine x 2) Call MD if unable to obtain prior to antibiotics being given     Status: None (Preliminary result)   Collection Time: 01/18/18  2:33 AM  Result Value Ref Range Status   Specimen Description   Final    BLOOD RIGHT ARM Performed at [facility], [address], [place], [place] [address]    Special Requests   Final    BOTTLES DRAWN AEROBIC AND ANAEROBIC Blood Culture  adequate volume Performed at [facility], [address] [place], [place] [address]    Culture   Final    NO GROWTH < 24 HOURS Performed at [facility], [address] [place], [place] [address]    Report Status PENDING  Incomplete  MRSA PCR Screening     Status: None   Collection Time: 01/19/18  9:44 AM  Result Value Ref Range Status   MRSA by PCR NEGATIVE NEGATIVE Final    Comment:        The GeneXpert MRSA Assay (FDA approved for NASAL specimens only), is one component of a comprehensive MRSA colonization surveillance program. It is not intended to diagnose MRSA infection nor to guide or monitor treatment for MRSA infections. Performed at [facility], [address] [place], [place] [address]      Time coordinating discharge: 35 minutes  SIGNED:   Marinda Elk, MD  [facility] 01/20/2018, 8:42 AM Pager   If 7PM-7AM, please contact night-coverage [email or web address] Password [email or web address]

## 2018-01-20 NOTE — Progress Notes (Signed)
    Durable Medical Equipment  (From admission, onward)        Start     Ordered   01/20/18 1030  For home use only DME Hospital bed  Once    Question Answer Comment  Patient has (list medical condition): Chronic pain   The above medical condition requires: Patient requires the ability to reposition frequently   Bed type Semi-electric      01/20/18 1030   01/20/18 0953  For home use only DME 3 n 1  Once     01/20/18 0952   01/20/18 0952  For home use only DME Walker rolling  Once    Question:  Patient needs a walker to treat with the following condition  Answer:  Weakness   01/20/18 0952

## 2018-01-20 NOTE — Progress Notes (Signed)
CSW notified pt needing SNF placement. Met with pt and mother at bedside- pt lives in [place] on [employer] property, mother feels that if pt able to receive therapy at SNF prior to returning home, she would better be able to handle his care. CSW discussed with pt medicaid requirements of staying 30+ days at [facility] and that pt sign his income check over to facility for duration of stay. Pt agreeable. Obtained PASRR and completed FL2 to refer to facilities- explained placement will be sought outside of [place] as well due to limited number of Medicaid beds. Pt and mother agreeable.  Will follow up with bed offers.  Sharren Bridge, MSW, LCSW Clinical Social Work 01/20/2018 [phone]

## 2018-01-21 MED ORDER — AZITHROMYCIN 250 MG PO TABS
500.0000 mg | ORAL_TABLET | Freq: Every day | ORAL | Status: DC
  Administered 2018-01-21 – 2018-01-22 (×2): 500 mg via ORAL
  Filled 2018-01-21 (×2): qty 2

## 2018-01-21 NOTE — Progress Notes (Signed)
[facility] PROGRESS NOTE    Progress Note  Cody [name]  NFA:[id number] DOB: 1960-11-12 DOA: 01/17/2018 PCP: Marcine Matar, MD     Brief Narrative:   Cody [name] is an 57 y.o. male past medical history significant for chronic pain, alcohol abuse and gout who was brought in by family members as he was found to be encephalopathic, family relate that he had been drinking beer on the day of admission no loss of control of sphincters in the ED was found with a temperature of 101.7, with a mild leukocytosis, new mild acute renal failure and chest x-ray that showed bilateral basilar infiltrates  Assessment/Plan:   Acute metabolic encephalopathy: Likely multifactorial in the setting of suspected carbon monoxide poisoning, And/or community-acquired pneumonia versus aspiration pneumonia. Now resolved, PT eval patient and rec SNF. awaiting placement,  Acute respiratory failure with hypoxia/ Bilateral pulmonary infiltrates on chest x-ray: Cont Unasyn  GERD: Continue PPI.  Tobacco use: Continue nicotine patch.  Suspect alcohol abuse: No signs of withdrawal continue to monitor with Ativan protocol.  Microcytic anemia With a suspected history of alcohol abuse start thiamine, folate.    DVT prophylaxis: lovenox Family Communication:none Disposition Plan/Barrier to D/C: unable to determine Code Status:     Code Status Orders  (From admission, onward)        Start     Ordered   01/18/18 0201  Full code  Continuous     01/18/18 0201    Code Status History    This patient has a current code status but no historical code status.        IV Access:    Peripheral IV   Procedures and diagnostic studies:   No results found.   Medical Consultants:    None.  Anti-Infectives:   IV Unasyn and azithromycin  Subjective:    Cody [name] patient has no new complains.  Objective:    Vitals:   01/20/18 0450 01/20/18 1240 01/20/18 2208 01/21/18 0524   BP: 130/81 122/77 136/87 133/88  Pulse: (!) 52 (!) 54 82 (!) 56  Resp: 16  (!) 26 20  Temp: 99.4 F (37.4 C) 98 F (36.7 C) 99.8 F (37.7 C) 98.2 F (36.8 C)  TempSrc: Oral Oral Oral Oral  SpO2: 96% 96% 96% 95%  Weight:      Height:        Intake/Output Summary (Last 24 hours) at 01/21/2018 1113 Last data filed at 01/21/2018 1001 Gross per 24 hour  Intake 1350 ml  Output 2750 ml  Net -1400 ml   Filed Weights   01/17/18 2142 01/18/18 0206  Weight: 79.4 kg (175 lb) 79 kg (174 lb 1.6 oz)    Exam: General exam: In no acute distress. Respiratory system: Good air movement and clear to auscultation. Cardiovascular system: S1 & S2 heard, RRR. No JVD. Gastrointestinal system: Abdomen is nondistended, soft and nontender.  Central nervous system: He is awake alert and oriented x2 3-12 are grossly intact sensation is intact throughout muscle strength is 5 out of 5 in all 4 extremities deep tendon reflexes are present sensation seems to be intact. Extremities: No pedal edema. Skin: No rashes, lesions or ulcers    Data Reviewed:    Labs: Basic Metabolic Panel: Recent Labs  Lab 01/17/18 2224 01/18/18 0526  NA 139 140  K 4.1 4.1  CL 105 107  CO2 22 26  GLUCOSE 89 94  BUN 18 16  CREATININE 1.20 1.22  CALCIUM 9.1 8.4*  GFR Estimated Creatinine Clearance: 70.3 mL/min (by C-G formula based on SCr of 1.22 mg/dL). Liver Function Tests: Recent Labs  Lab 01/17/18 2224 01/18/18 0526  AST 51* 43*  ALT 46 36  ALKPHOS 72 55  BILITOT 0.7 0.9  PROT 8.1 6.6  ALBUMIN 4.3 3.5   No results for input(s): LIPASE, AMYLASE in the last 168 hours. No results for input(s): AMMONIA in the last 168 hours. Coagulation profile No results for input(s): INR, PROTIME in the last 168 hours.  CBC: Recent Labs  Lab 01/17/18 2224 01/18/18 0526  WBC 12.3* 13.4*  NEUTROABS 10.5* 11.0*  HGB 12.6* 11.8*  HCT 37.6* 36.2*  MCV 106.8* 106.8*  PLT 189 208   Cardiac Enzymes: No results for  input(s): CKTOTAL, CKMB, CKMBINDEX, TROPONINI in the last 168 hours. BNP (last 3 results) No results for input(s): PROBNP in the last 8760 hours. CBG: No results for input(s): GLUCAP in the last 168 hours. D-Dimer: No results for input(s): DDIMER in the last 72 hours. Hgb A1c: Recent Labs    01/19/18 0324  HGBA1C 4.8   Lipid Profile: Recent Labs    01/19/18 0324  CHOL 152  HDL 36*  LDLCALC 100*  TRIG 79  CHOLHDL 4.2   Thyroid function studies: No results for input(s): TSH, T4TOTAL, T3FREE, THYROIDAB in the last 72 hours.  Invalid input(s): FREET3 Anemia work up: No results for input(s): VITAMINB12, FOLATE, FERRITIN, TIBC, IRON, RETICCTPCT in the last 72 hours. Sepsis Labs: Recent Labs  Lab 01/17/18 2224 01/18/18 0025 01/18/18 0526  WBC 12.3*  --  13.4*  LATICACIDVEN  --  1.77  --    Microbiology Recent Results (from the past 240 hour(s))  Culture, blood (routine x 2)     Status: None (Preliminary result)   Collection Time: 01/17/18 11:15 PM  Result Value Ref Range Status   Specimen Description   Final    BLOOD RIGHT ANTECUBITAL Performed at [facility], [address] [place], [place] [address]    Special Requests   Final    BOTTLES DRAWN AEROBIC AND ANAEROBIC Blood Culture results may not be optimal due to an excessive volume of blood received in culture bottles Performed at [facility], [address] [place], [place] [address]    Culture   Final    NO GROWTH 2 DAYS Performed at [facility], [address] [place], [place] [address]    Report Status PENDING  Incomplete  Culture, blood (routine x 2) Call MD if unable to obtain prior to antibiotics being given     Status: None (Preliminary result)   Collection Time: 01/18/18  2:33 AM  Result Value Ref Range Status   Specimen Description   Final    BLOOD RIGHT ARM Performed at [facility], [address] [place], [place] [address]    Special  Requests   Final    BOTTLES DRAWN AEROBIC AND ANAEROBIC Blood Culture adequate volume Performed at [facility], [address] [place], [place] [address]    Culture   Final    NO GROWTH 2 DAYS Performed at [facility], [address] [place], [place] [address]    Report Status PENDING  Incomplete  MRSA PCR Screening     Status: None   Collection Time: 01/19/18  9:44 AM  Result Value Ref Range Status   MRSA by PCR NEGATIVE NEGATIVE Final    Comment:  The GeneXpert MRSA Assay (FDA approved for NASAL specimens only), is one component of a comprehensive MRSA colonization surveillance program. It is not intended to diagnose MRSA infection nor to guide or monitor treatment for MRSA infections. Performed at [facility], [address], [place], [place] [address]      Medications:   . acetaminophen  650 mg Oral Once  . amoxicillin-clavulanate  1 tablet Oral Q12H  . atorvastatin  40 mg Oral q1800  . azithromycin  500 mg Oral Daily  . enoxaparin (LOVENOX) injection  40 mg Subcutaneous Q24H  . folic acid  1 mg Oral Daily  . mouth rinse  15 mL Mouth Rinse q12n4p  . mouth rinse  15 mL Mouth Rinse q12n4p  . thiamine  100 mg Oral Daily   Continuous Infusions: . sodium chloride 125 mL/hr at 01/21/18 0951      LOS: 3 days   Marinda Elk  [facility] Pager [phone]  *Please refer to [email or web address], password [email or web address] to get updated schedule on who will round on this patient, as hospitalists switch teams weekly. If 7PM-7AM, please contact night-coverage at [email or web address], password [email or web address] for any overnight needs.  01/21/2018, 11:13 AM

## 2018-01-21 NOTE — Progress Notes (Signed)
Pt has no SNF bed offers thus far. CSW followed up with facilities pt referred to- [facility] investigating whether pt will be approved for Medicaid short term bed offer. No other options at this time.   Will follow up with pt and mother pending decision on bed offer from faciliities above.  Ilean SkillMeghan Gayanne Prescott, MSW, LCSW Clinical Social Work 01/21/2018 [phone]

## 2018-01-22 ENCOUNTER — Inpatient Hospital Stay (HOSPITAL_COMMUNITY): Payer: Medicaid Other

## 2018-01-22 DIAGNOSIS — F1410 Cocaine abuse, uncomplicated: Secondary | ICD-10-CM

## 2018-01-22 MED ORDER — AMOXICILLIN-POT CLAVULANATE 875-125 MG PO TABS: 1.0000 | ORAL_TABLET | Freq: Two times a day (BID) | ORAL | Status: AC

## 2018-01-22 NOTE — Discharge Summary (Addendum)
Physician Discharge Summary  Cody BoschDanny L [name] [id number] DOB: 09/30/1960 DOA: 01/17/2018  PCP: Marcine MatarJohnson, Deborah B, MD  Admit date: 01/17/2018 Discharge date: 01/22/2018  Admitted From: Home Disposition:  SNF  Recommendations for Outpatient Follow-up:  1. Follow up with PCP in 1-2 weeks, colonoscopy as an outpatient. 2. Neurology follow-up  Home Health:yes Equipment/Devices:none  Discharge Condition:stable CODE STATUS:full Diet recommendation: Heart Healthy   Brief/Interim Summary: 57 y.o. male past medical history significant for chronic pain, alcohol abuse and gout who was brought in by family members as he was found to be encephalopathic, family relate that he had been drinking beer on the day of admission no loss of control of sphincters in the ED was found with a temperature of 101.7, with a mild leukocytosis, new mild acute renal failure and chest x-ray that showed bilateral basilar infiltrates  Discharge Diagnoses:  Principal Problem:   Acute metabolic encephalopathy Active Problems:   Esophageal reflux   Tobacco use   Community acquired pneumonia   Bilateral pulmonary infiltrates on chest x-ray   Suspected carbon monoxide poisoning   Macrocytic anemia   Severe sepsis (HCC)   Cocaine abuse (HCC)  Acute metabolic encephalopathy multifactorial likely due to aspiration pneumonia and carbon monoxide poisoning: Patient is also on multiple medications and polypharmacy could have played a role, but on admission he was started on nasal cannula high flow and empiric antibiotics. Confusion improved but not at baseline per family. May or may not improve significantly over weeks to months. Discussed case with neurosurgery. Recommend neurology follow-up as an outpatient.l  Acute respiratory failure with hypoxia due to aspiration pneumonia Sepsis secondary to pneumonia, present on admission Chest x-ray showed bilateral infiltrates he was started on Unasyn and azithromycin by the next  day he was leukocytosis improved he was changed to Augmentin which she will continue as an outpatient.   GERD: Continue famotidine.  Tobacco abuse: Counseling.  Suspect alcohol use: He was counseled he had no signs of withdrawal.  Microcytic anemia: We will need to follow-up with PCP as an outpatient patient has never had a colonoscopy.  No signs of overt bleeding in house.  Discharge summary initially written by Dr. Marinda ElkAbraham Feliz Ortiz. Changes have been made to the discharge summary to reflect changes prior to discharge. Changes are italicized.  Discharge Instructions  Discharge Instructions    Diet - low sodium heart healthy   Complete by:  As directed    Increase activity slowly   Complete by:  As directed      Allergies as of 01/22/2018   No Known Allergies     Medication List    STOP taking these medications   naproxen 500 MG tablet Commonly known as:  NAPROSYN     TAKE these medications   amoxicillin-clavulanate 875-125 MG tablet Commonly known as:  AUGMENTIN Take 1 tablet by mouth every 12 (twelve) hours for 3 days.   benzonatate 100 MG capsule Commonly known as:  TESSALON Take 1 capsule (100 mg total) by mouth 2 (two) times daily as needed for cough.   Cane Misc 1 each by Does not apply route daily.   cetirizine 10 MG tablet Commonly known as:  ZYRTEC Take 1 tablet (10 mg total) by mouth daily.   diclofenac sodium 1 % Gel Commonly known as:  VOLTAREN Apply 2 g topically 4 (four) times daily.   diphenhydrAMINE 25 MG tablet Commonly known as:  BENADRYL Take 1 tablet (25 mg total) by mouth every 6 (six) hours.  famotidine 20 MG tablet Commonly known as:  PEPCID Take 1 tablet (20 mg total) by mouth 2 (two) times daily.   methocarbamol 750 MG tablet Commonly known as:  ROBAXIN Take 1 tablet (750 mg total) by mouth 4 (four) times daily.   MULTIVITAMIN ADULT Tabs Take 1 tablet by mouth daily.   omeprazole 20 MG capsule Commonly known as:   PRILOSEC Take 1 capsule (20 mg total) by mouth 2 (two) times daily before a meal.   thiamine 100 MG tablet Take 1 tablet (100 mg total) by mouth daily.            Durable Medical Equipment  (From admission, onward)        Start     Ordered   01/20/18 1030  For home use only DME Hospital bed  Once    Question Answer Comment  Patient has (list medical condition): Chronic pain   The above medical condition requires: Patient requires the ability to reposition frequently   Bed type Semi-electric      01/20/18 1030   01/20/18 0953  For home use only DME 3 n 1  Once     01/20/18 0952   01/20/18 0952  For home use only DME Walker rolling  Once    Question:  Patient needs a walker to treat with the following condition  Answer:  Weakness   01/20/18 0952      No Known Allergies  Consultations:  None   Procedures/Studies: Dg Chest 2 View  Result Date: 01/17/2018 CLINICAL DATA:  Cough.  Altered mental status. EXAM: CHEST - 2 VIEW COMPARISON:  Chest x-ray dated October 18, 2015. FINDINGS: The heart size and mediastinal contours are within normal limits. Normal pulmonary vascularity. Low lung volumes with bibasilar patchy opacities. No pleural effusion or pneumothorax. No acute osseous abnormality. IMPRESSION: 1. Patchy bibasilar atelectasis versus infiltrates. Electronically Signed   By: Obie Dredge M.D.   On: 01/17/2018 22:31   Ct Head Wo Contrast  Result Date: 01/17/2018 CLINICAL DATA:  Altered mental status. EXAM: CT HEAD WITHOUT CONTRAST TECHNIQUE: Contiguous axial images were obtained from the base of the skull through the vertex without intravenous contrast. COMPARISON:  CT head dated October 18, 2015. FINDINGS: Brain: There is subtle hypodensity within the region of the bilateral globus pallidi. No evidence of acute infarction, hemorrhage, hydrocephalus, extra-axial collection or mass lesion/mass effect. Vascular: Atherosclerotic vascular calcification of the carotid siphons. No  hyperdense vessel. Skull: Normal. Negative for fracture or focal lesion. Sinuses/Orbits: No acute finding. Other: None. IMPRESSION: 1. Subtle hypodensity within the region of the bilateral globus pallidi, which can be seen with toxic encephalopathies such as carbon monoxide poisoning. Consider MRI for further evaluation. Electronically Signed   By: Obie Dredge M.D.   On: 01/17/2018 22:29   Mr Brain Wo Contrast  Result Date: 01/18/2018 CLINICAL DATA:  57 year old male with altered mental status. EXAM: MRI HEAD WITHOUT CONTRAST TECHNIQUE: Multiplanar, multiecho pulse sequences of the brain and surrounding structures were obtained without intravenous contrast. COMPARISON:  Head CT without contrast 01/17/2018 and earlier. FINDINGS: Study is intermittently degraded by motion artifact despite repeated imaging attempts. Brain: There is symmetric restricted diffusion in the bilateral globus pallidus, and bilateral hippocampal formations. Confluent associated T2 and FLAIR hyperintensity in those regions. No associated hemorrhage or mass effect. Additionally, there are two more patchy and linear foci of restricted diffusion in the left caudate nucleus (series 4, image 30), and the left lateral cerebellar hemisphere (series 6, image 9).  Similar to slightly less pronounced T2 and FLAIR hyperintensity in those regions. No other diffusion restriction. Underlying gray and white matter signal appears normal elsewhere in the brain. No chronic encephalomalacia or chronic cerebral blood products identified. No midline shift, mass effect, evidence of mass lesion, ventriculomegaly, extra-axial collection or acute intracranial hemorrhage. Cervicomedullary junction and pituitary are within normal limits. Vascular: Major intracranial vascular flow voids are preserved. Skull and upper cervical spine: Negative visible cervical spine. Visualized bone marrow signal is within normal limits. Sinuses/Orbits: Grossly normal orbits soft  tissues. Paranasal sinuses and mastoids are stable and well pneumatized. Other: Visible internal auditory structures appear normal. Scalp and face soft tissues appear negative. IMPRESSION: 1. Striking edema and diffusion restriction in the bilateral globus pallidus and bilateral hippocampi. Top differential considerations include cxcitotoxicity due to drug overdose (such as opiates) versus other toxic exposure (such as carbon monoxide) with subsequent hypoxic-ischemic injury. 2. Two superimposed small foci of restricted diffusion in the left caudate and the left cerebellum resemble typical acute lacunar infarcts. 3. No associated hemorrhage or mass effect. Electronically Signed   By: Odessa Fleming M.D.   On: 01/18/2018 12:20    Subjective: No issues.  Discharge Exam: Vitals:   01/22/18 0611 01/22/18 1106  BP: 137/77 131/66  Pulse: (!) 46 (!) 55  Resp: 12 16  Temp: 98.6 F (37 C) 98.2 F (36.8 C)  SpO2: 96% 97%   Vitals:   01/21/18 2136 01/22/18 0219 01/22/18 0611 01/22/18 1106  BP: (!) 145/80 (!) 147/77 137/77 131/66  Pulse: (!) 57 (!) 59 (!) 46 (!) 55  Resp: 16 14 12 16   Temp: 99 F (37.2 C) 99.1 F (37.3 C) 98.6 F (37 C) 98.2 F (36.8 C)  TempSrc: Oral Oral Oral Oral  SpO2: 95% 96% 96% 97%  Weight:      Height:        General exam: Appears calm and comfortable Respiratory system: Clear to auscultation. Respiratory effort normal. Cardiovascular system: S1 & S2 heard, RRR. No murmurs, rubs, gallops or clicks. Gastrointestinal system: Abdomen is nondistended, soft and nontender. No organomegaly or masses felt. Normal bowel sounds heard. Central nervous system: Alert and oriented to person and place only Extremities: No edema. No calf tenderness Skin: No cyanosis. No rashes Psychiatry: Judgement and insight appear impaired. Flat affect   The results of significant diagnostics from this hospitalization (including imaging, microbiology, ancillary and laboratory) are listed below for  reference.     Microbiology: Recent Results (from the past 240 hour(s))  Culture, blood (routine x 2)     Status: None (Preliminary result)   Collection Time: 01/17/18 11:15 PM  Result Value Ref Range Status   Specimen Description   Final    BLOOD RIGHT ANTECUBITAL Performed at [facility], [address] [place], [place] [address]    Special Requests   Final    BOTTLES DRAWN AEROBIC AND ANAEROBIC Blood Culture results may not be optimal due to an excessive volume of blood received in culture bottles Performed at [facility], [address] [place], [place] [address]    Culture   Final    NO GROWTH 4 DAYS Performed at [facility], [address], [place], [place] [address]    Report Status PENDING  Incomplete  Culture, blood (routine x 2) Call MD if unable to obtain prior to antibiotics being given     Status: None (Preliminary result)   Collection Time: 01/18/18  2:33 AM  Result Value Ref Range Status   Specimen Description   Final    BLOOD RIGHT ARM Performed at [facility], [address], [place], [place] [address]    Special Requests   Final    BOTTLES DRAWN AEROBIC AND ANAEROBIC Blood Culture adequate volume Performed at [facility], [address], [place], [place] [address]    Culture   Final    NO GROWTH 4 DAYS Performed at [facility], [address] [place], [place] [address]    Report Status PENDING  Incomplete  MRSA PCR Screening     Status: None   Collection Time: 01/19/18  9:44 AM  Result Value Ref Range Status   MRSA by PCR NEGATIVE NEGATIVE Final    Comment:        The GeneXpert MRSA Assay (FDA approved for NASAL specimens only), is one component of a comprehensive MRSA colonization surveillance program. It is not intended to diagnose MRSA infection nor to guide or monitor treatment for MRSA infections. Performed at [facility], [address], [place], [place] [address]      Labs: BNP (last 3 results) No results for input(s): BNP in the last 8760 hours. Basic Metabolic Panel: Recent Labs  Lab 01/17/18 2224 01/18/18 0526  NA 139 140  K 4.1 4.1  CL 105 107  CO2 22 26  GLUCOSE 89 94  BUN 18 16  CREATININE 1.20 1.22  CALCIUM 9.1 8.4*   Liver Function Tests: Recent Labs  Lab 01/17/18 2224 01/18/18 0526  AST 51* 43*  ALT 46 36  ALKPHOS 72 55  BILITOT 0.7 0.9  PROT 8.1 6.6  ALBUMIN 4.3 3.5   No results for input(s): LIPASE, AMYLASE in the last 168 hours. No results for input(s): AMMONIA in the last 168 hours. CBC: Recent Labs  Lab 01/17/18 2224 01/18/18 0526  WBC 12.3* 13.4*  NEUTROABS 10.5* 11.0*  HGB 12.6* 11.8*  HCT 37.6* 36.2*  MCV 106.8* 106.8*  PLT 189 208   Cardiac Enzymes: No results for input(s): CKTOTAL, CKMB, CKMBINDEX, TROPONINI in the last 168 hours. BNP: Invalid input(s): POCBNP CBG: No results for input(s): GLUCAP in the last 168 hours. D-Dimer No results for input(s): DDIMER in the last 72 hours. Hgb A1c No results for input(s): HGBA1C in the last 72 hours. Lipid Profile No results for input(s): CHOL, HDL, LDLCALC, TRIG, CHOLHDL, LDLDIRECT in the last 72 hours. Thyroid function studies No results for input(s): TSH, T4TOTAL, T3FREE, THYROIDAB in the last 72 hours.  Invalid input(s): FREET3 Anemia work up No results for input(s): VITAMINB12, FOLATE, FERRITIN, TIBC, IRON, RETICCTPCT in the last 72 hours. Urinalysis    Component Value Date/Time   COLORURINE YELLOW 01/18/2018 1600   APPEARANCEUR CLEAR 01/18/2018 1600   LABSPEC 1.019 01/18/2018 1600   PHURINE 5.0 01/18/2018 1600   GLUCOSEU NEGATIVE 01/18/2018 1600   HGBUR NEGATIVE 01/18/2018 1600   BILIRUBINUR NEGATIVE 01/18/2018 1600   KETONESUR 20 (A) 01/18/2018 1600   PROTEINUR NEGATIVE 01/18/2018 1600   UROBILINOGEN 0.2 12/08/2009 2250   NITRITE NEGATIVE 01/18/2018 1600   LEUKOCYTESUR TRACE (A) 01/18/2018 1600   Sepsis  Labs Invalid input(s): PROCALCITONIN,  WBC,  LACTICIDVEN Microbiology Recent Results (from the past 240 hour(s))  Culture, blood (routine x 2)     Status: None (Preliminary result)   Collection Time: 01/17/18 11:15 PM  Result Value Ref Range Status   Specimen Description   Final    BLOOD RIGHT ANTECUBITAL  Performed at [facility], [address], [place], [place] [address]    Special Requests   Final    BOTTLES DRAWN AEROBIC AND ANAEROBIC Blood Culture results may not be optimal due to an excessive volume of blood received in culture bottles Performed at [facility], [address] [place], [place] [address]    Culture   Final    NO GROWTH 4 DAYS Performed at [facility], [address], [place], [place] [address]    Report Status PENDING  Incomplete  Culture, blood (routine x 2) Call MD if unable to obtain prior to antibiotics being given     Status: None (Preliminary result)   Collection Time: 01/18/18  2:33 AM  Result Value Ref Range Status   Specimen Description   Final    BLOOD RIGHT ARM Performed at [facility], [address] [place], [place] [address]    Special Requests   Final    BOTTLES DRAWN AEROBIC AND ANAEROBIC Blood Culture adequate volume Performed at [facility], [address], [place], [place] [address]    Culture   Final    NO GROWTH 4 DAYS Performed at [facility], [address] [place], [place] [address]    Report Status PENDING  Incomplete  MRSA PCR Screening     Status: None   Collection Time: 01/19/18  9:44 AM  Result Value Ref Range Status   MRSA by PCR NEGATIVE NEGATIVE Final    Comment:        The GeneXpert MRSA Assay (FDA approved for NASAL specimens only), is one component of a comprehensive MRSA colonization surveillance program. It is not intended to diagnose MRSA infection nor to guide or monitor treatment for MRSA infections. Performed at [facility], [address] [place], [place] [address]      Time coordinating discharge: 35 minutes  SIGNED:   Jacquelin Hawking, MD  [facility] 01/22/2018, 2:02 PM Pager   If 7PM-7AM, please contact night-coverage [email or web address] Password [email or web address]

## 2018-01-22 NOTE — Progress Notes (Signed)
Discussed bed offer [facility] with pt and mother- pt agreeable to accepting- [facility] and signing over SSI check to facility. Will provide updated DC summary and arrange transportation once pt ready for DC.  Ilean SkillMeghan Kamira Mellette, MSW, LCSW Clinical Social Work 01/22/2018 [phone]

## 2018-01-22 NOTE — Consult Note (Signed)
WOC Nurse wound consult note Reason for Consult: Left heel blister Wound type: Exam completed in [facility] 1614.   Area is consistent with DTI. Pressure Injury POA: Yes Measurement: 6 cm x 6 cm Wound bed: Epithelium remains intact.  The area is discolored purple with a lighter/yellowish perimeter. Drainage (amount, consistency, odor) No drainage, no odor.  Patient does not indicate the area is painful. Periwound: Intact, normal color. Dressing procedure/placement/frequency:  Apply betadine each shift.  Allow to air dry. Wrap in kerlex. Patient's sister present throughout and her questions answered to her expressed satisfaction. Monitor the wound area(s) for worsening of condition such as: Signs/symptoms of infection,  Increase in size,  Development of or worsening of odor, Development of pain, or increased pain at the affected locations.  Notify the medical team if any of these develop.  Thank you for the consult.  Discussed plan of care with the patient and bedside nurse.  WOC nurse will not follow at this time.  Please re-consult the WOC team if needed.  Helmut MusterSherry Daking Westervelt, RN, MSN, CWOCN, CNS-BC, pager [phone]
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
## 2018-01-22 NOTE — Clinical Social Work Placement (Signed)
Pt [facility]- report #[id number]. Mother at bedside going to facility to complete paperwork. Will arrange PTAR transport. DC summary provided via the HUB   CLINICAL SOCIAL WORK PLACEMENT  NOTE  Date:  01/22/2018  Patient Details  Name: Cody [name] MRN: [id number] Date of Birth: 01/19/1961  Clinical Social Work is seeking post-discharge placement for this patient at the Skilled  Nursing Facility level of care (*CSW will initial, date and re-position this form in  chart as items are completed):  Yes   Patient/family provided with [facility] Clinical Social Work Department's list of facilities offering this level of care within the geographic area requested by the patient (or if unable, by the patient's family).  Yes   Patient/family informed of their freedom to choose among providers that offer the needed level of care, that participate in Medicare, Medicaid or managed care program needed by the patient, have an available bed and are willing to accept the patient.  Yes   Patient/family informed of [facility]'s ownership interest in [facility] and [facility], as well as of the fact that they are under no obligation to receive care at these facilities.  PASRR submitted to EDS on 01/20/18     PASRR number received on 01/20/18     Existing PASRR number confirmed on       FL2 transmitted to all facilities in geographic area requested by pt/family on 01/20/18     FL2 transmitted to all facilities within larger geographic area on       Patient informed that his/her managed care company has contracts with or will negotiate with certain facilities, including the following:        Yes   Patient/family informed of bed offers received.  [facility]     Physician recommends and patient [facility]    [facility].  Patient to be transferred to facility by      PTAR  Patient family notified on 01/22/18 of transfer.  Name of family member notified:  mother Kathie RhodesBetty     PHYSICIAN       Additional Comment:    _______________________________________________ Nelwyn SalisburyMeghan R Parmvir Boomer, LCSW 01/22/2018, 3:05 PM  [phone]

## 2018-01-23 LAB — CULTURE, BLOOD (ROUTINE X 2)
Culture: NO GROWTH
Culture: NO GROWTH
Special Requests: ADEQUATE

## 2018-03-05 ENCOUNTER — Other Ambulatory Visit: Payer: Self-pay

## 2018-03-05 DIAGNOSIS — R6889 Other general symptoms and signs: Secondary | ICD-10-CM

## 2018-03-05 DIAGNOSIS — I739 Peripheral vascular disease, unspecified: Secondary | ICD-10-CM

## 2018-04-28 ENCOUNTER — Encounter: Payer: Self-pay | Admitting: Surgery

## 2018-04-28 ENCOUNTER — Ambulatory Visit (HOSPITAL_COMMUNITY)
Admission: RE | Admit: 2018-04-28 | Discharge: 2018-04-28 | Disposition: A | Discharge status: Home / Self Care | Payer: Medicaid Other | Source: Ambulatory Visit | Attending: Surgery | Admitting: Surgery

## 2018-04-28 ENCOUNTER — Ambulatory Visit (INDEPENDENT_AMBULATORY_CARE_PROVIDER_SITE_OTHER): Payer: Medicaid Other | Admitting: Surgery

## 2018-04-28 ENCOUNTER — Other Ambulatory Visit: Payer: Self-pay

## 2018-04-28 VITALS — BP 112/68 | HR 54 | Temp 97.9°F | Resp 20 | Ht 71.0 in | Wt 174.0 lb

## 2018-04-28 DIAGNOSIS — I70213 Atherosclerosis of native arteries of extremities with intermittent claudication, bilateral legs: Secondary | ICD-10-CM

## 2018-04-28 DIAGNOSIS — R6889 Other general symptoms and signs: Secondary | ICD-10-CM | POA: Diagnosis not present

## 2018-04-28 DIAGNOSIS — R0989 Other specified symptoms and signs involving the circulatory and respiratory systems: Secondary | ICD-10-CM | POA: Diagnosis not present

## 2018-04-28 DIAGNOSIS — I739 Peripheral vascular disease, unspecified: Secondary | ICD-10-CM | POA: Diagnosis not present

## 2018-04-28 DIAGNOSIS — F17200 Nicotine dependence, unspecified, uncomplicated: Secondary | ICD-10-CM | POA: Insufficient documentation
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
## 2019-07-14 IMAGING — CR DG HIP (WITH OR WITHOUT PELVIS) 2-3V*L*
3 series · 3 of 3 positions shown · non-contrast
Comparison: None.

CLINICAL DATA: Chronic left hip pain.  No known injury.

EXAM:
DG HIP (WITH OR WITHOUT PELVIS) 2-3V LEFT

[pelvis ap]
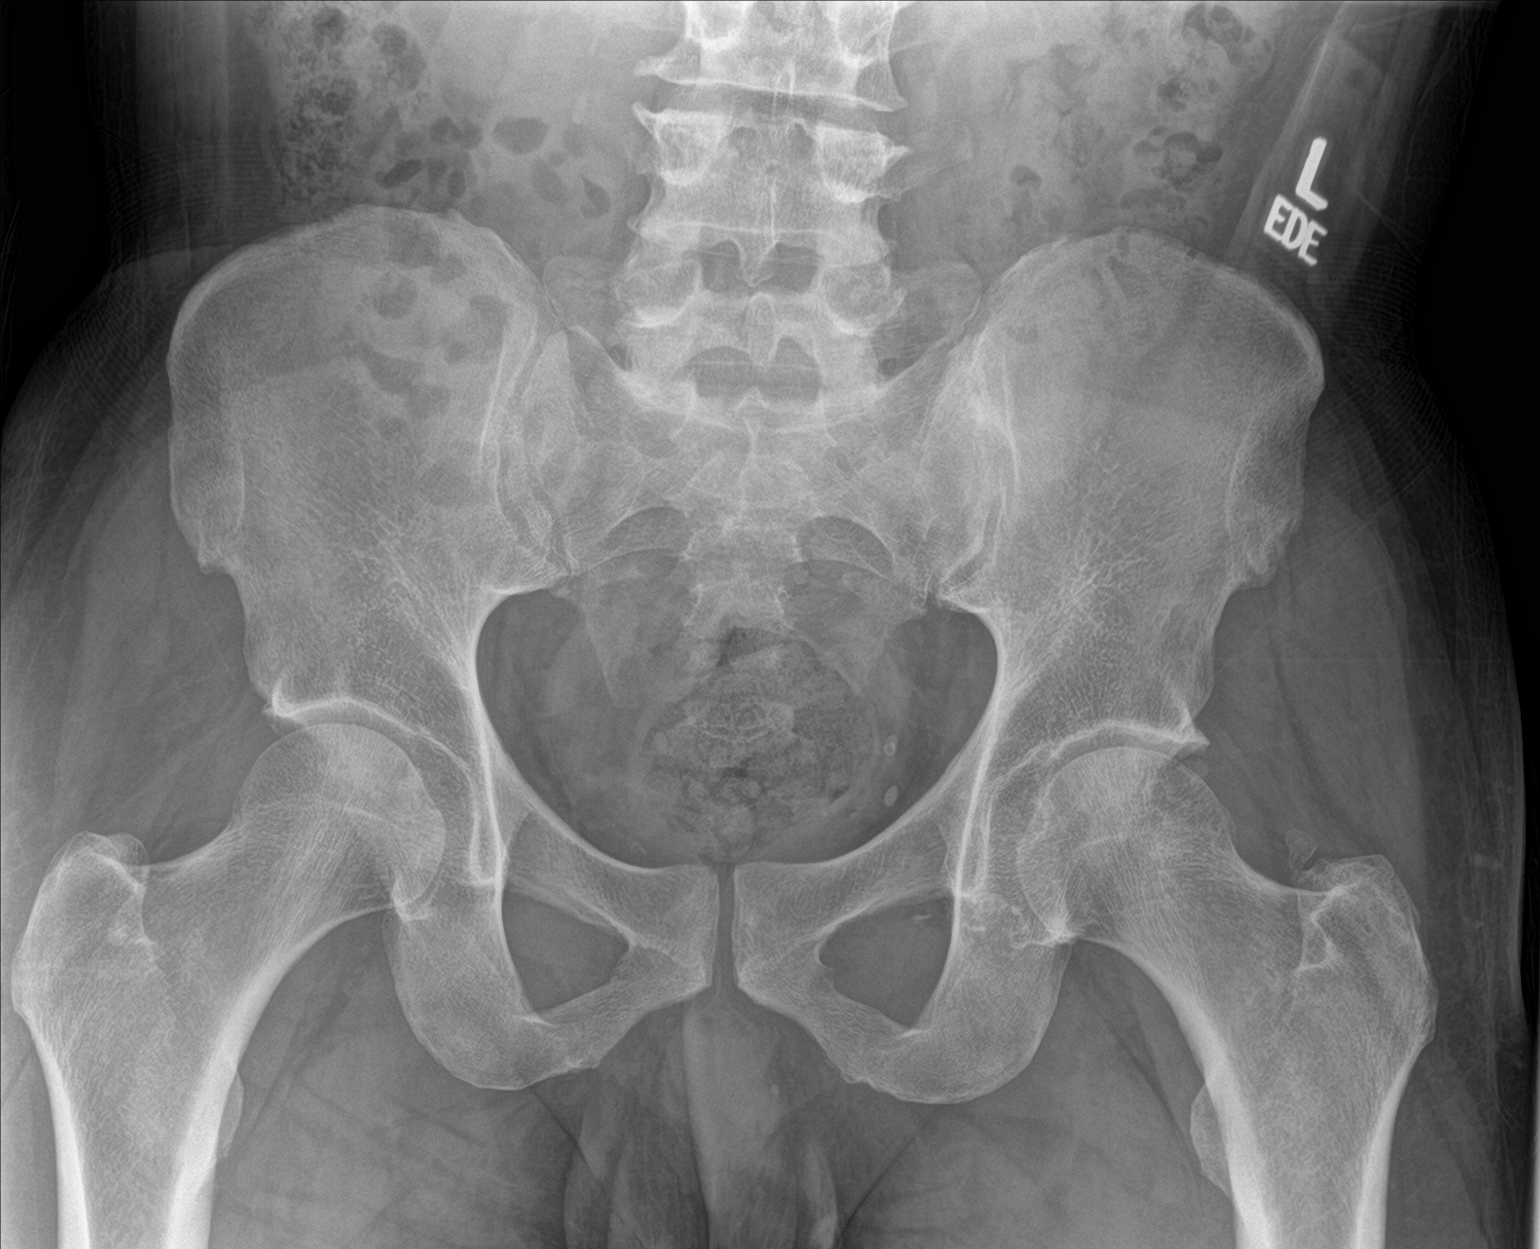

[hip ap]
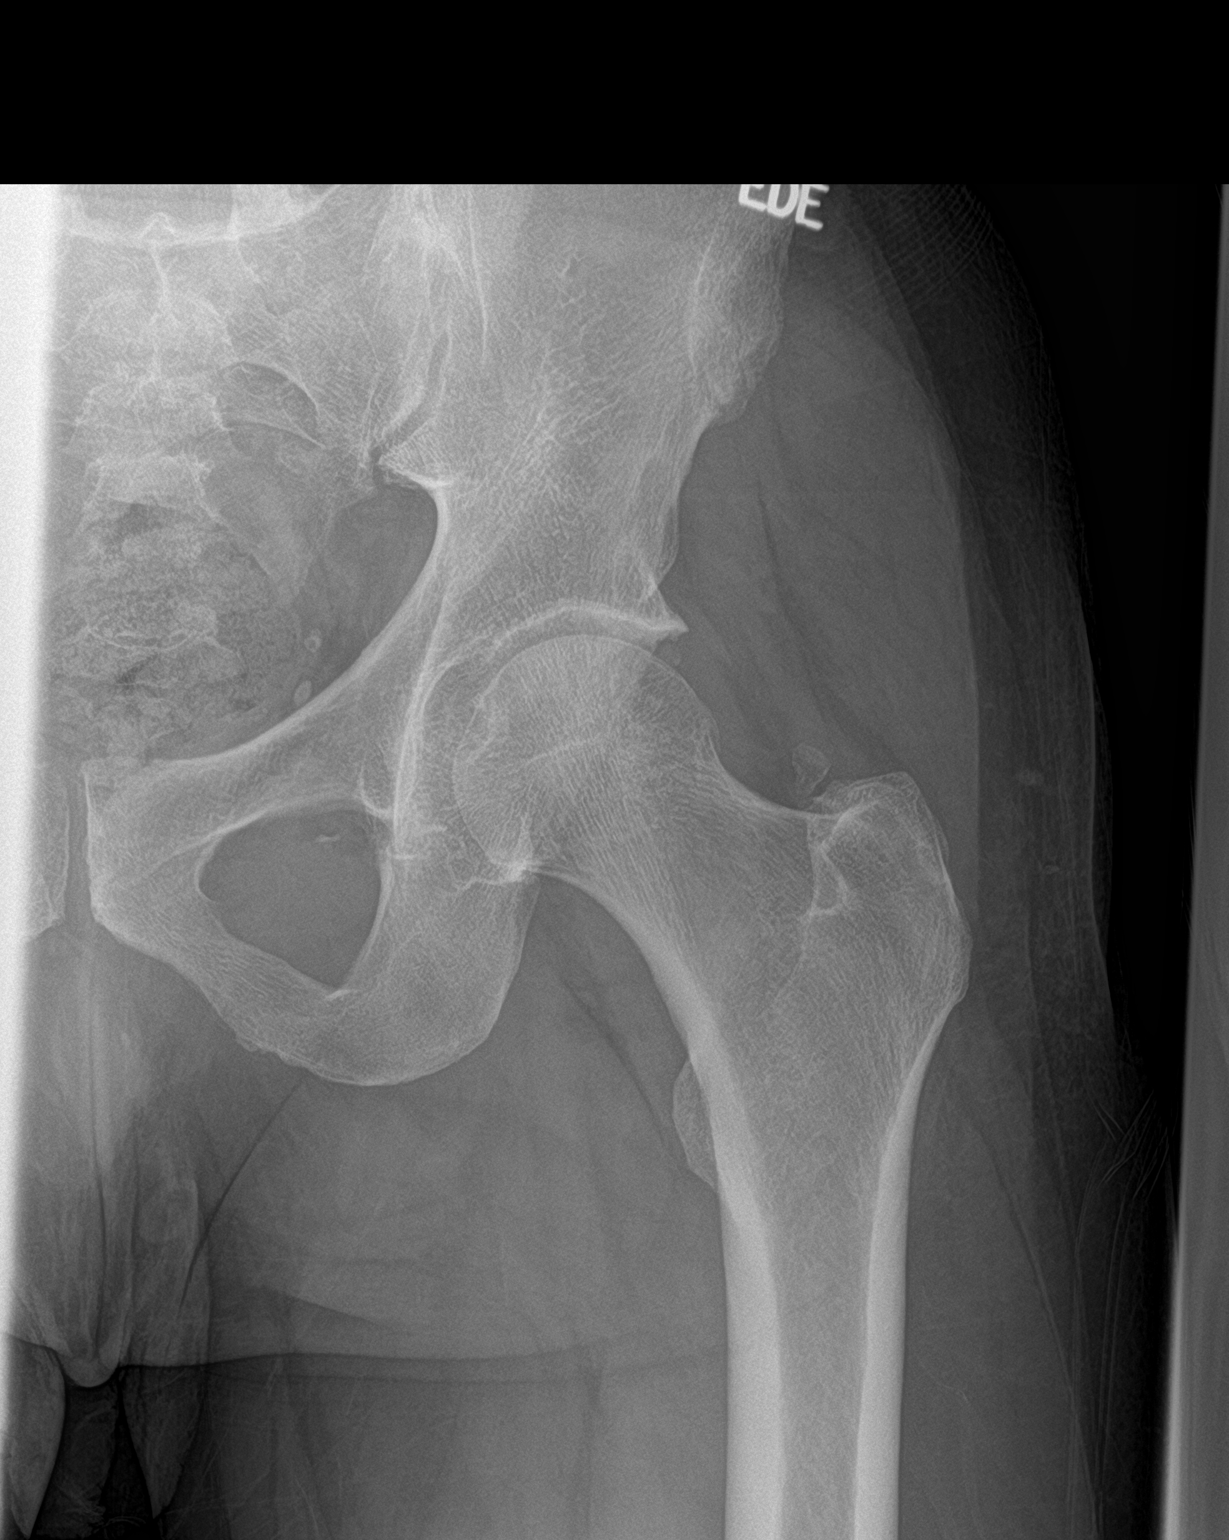

[hip lat]
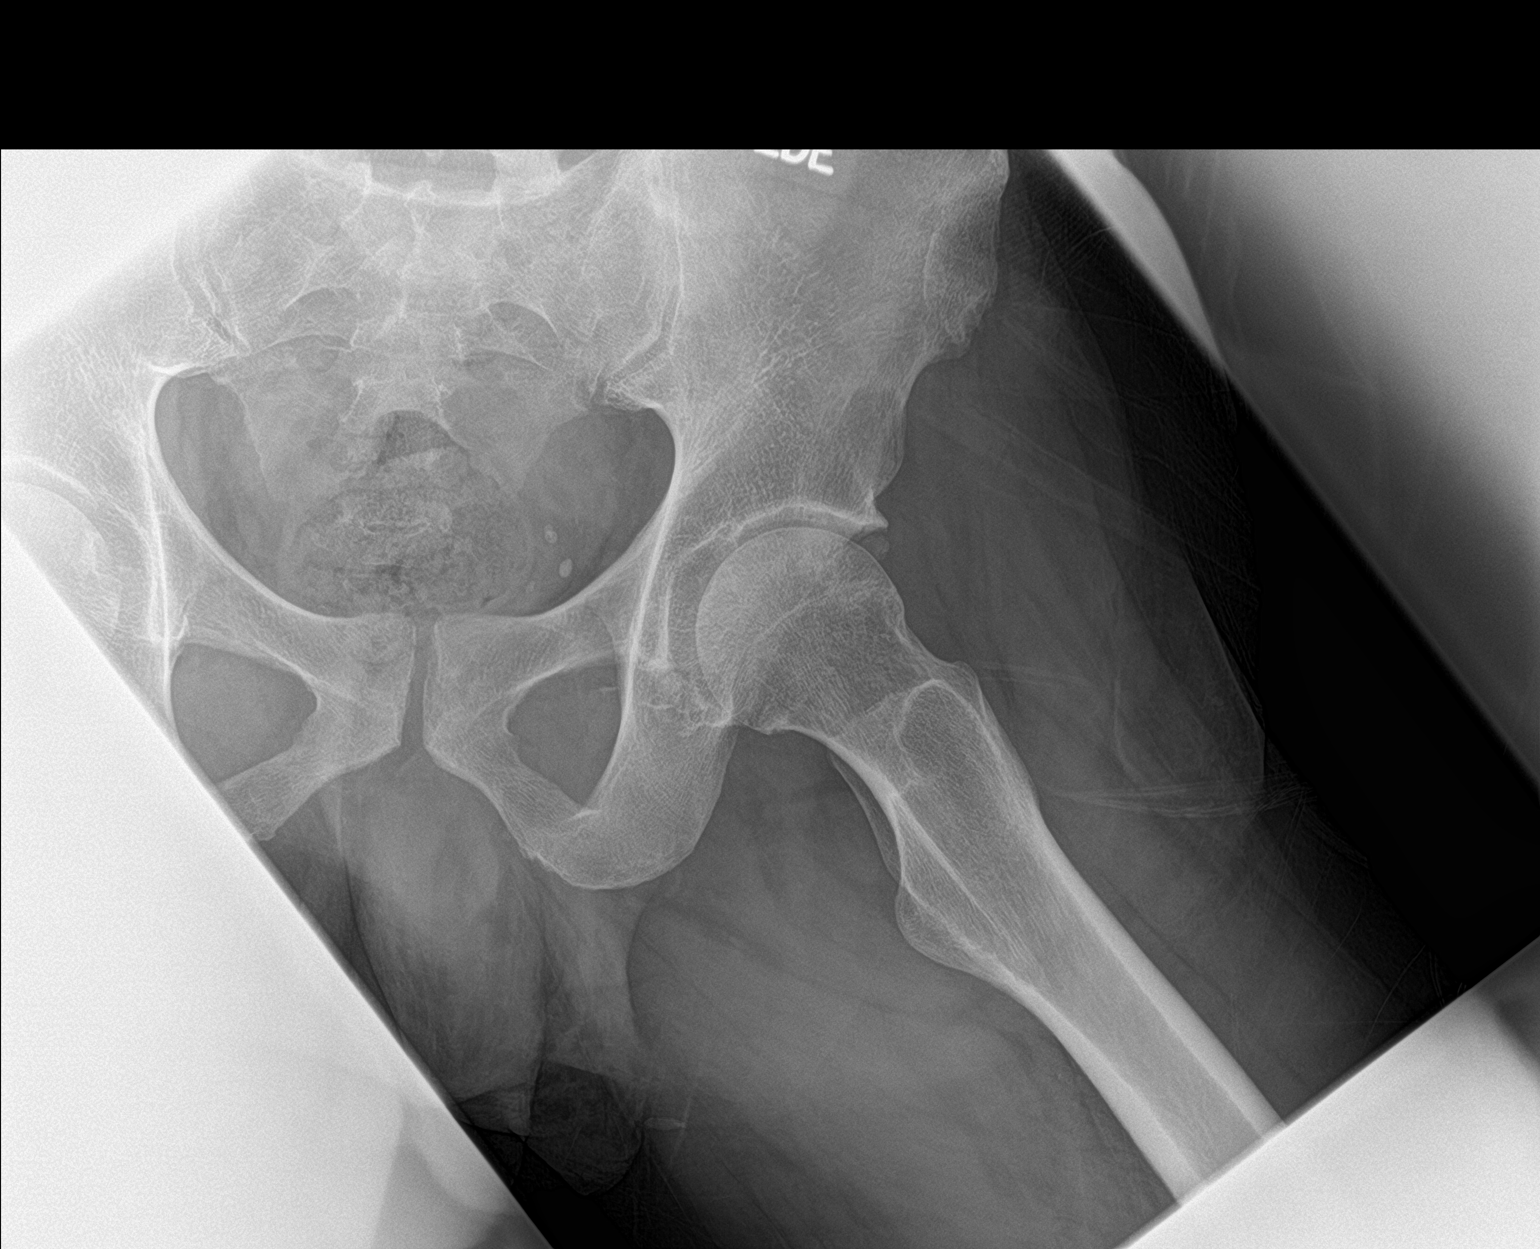

[3 of 3 positions shown; findings below may reference images not displayed]

FINDINGS: No acute bony or joint abnormality is seen. Mild degenerative
disease is present about the hips. Small ossification off the left
greater trochanter is likely due to chronic/remote gluteal
tendinopathy.
IMPRESSION: No acute finding.

Mild degenerative disease about the hips.

Small ossification off the greater trochanter is likely due to
chronic/remote gluteal tendinopathy.

## 2019-10-12 IMAGING — DX DG OS CALCIS 2+V*L*
3 series · 3 of 3 positions shown · non-contrast
Comparison: None.

CLINICAL DATA: Blister

EXAM:
LEFT OS CALCIS - 2+ VIEW

[calcaneus axial (1 of 2)]
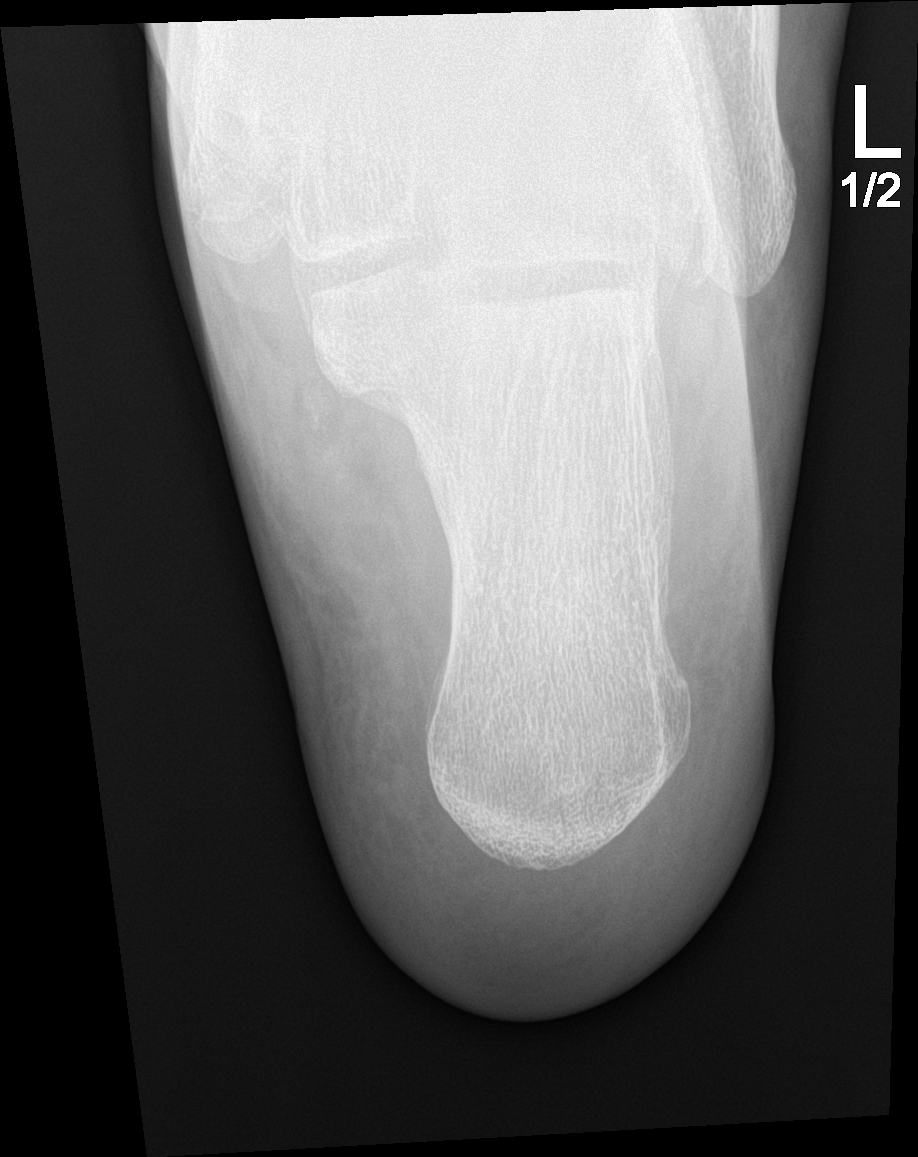

[calcaneus lat]
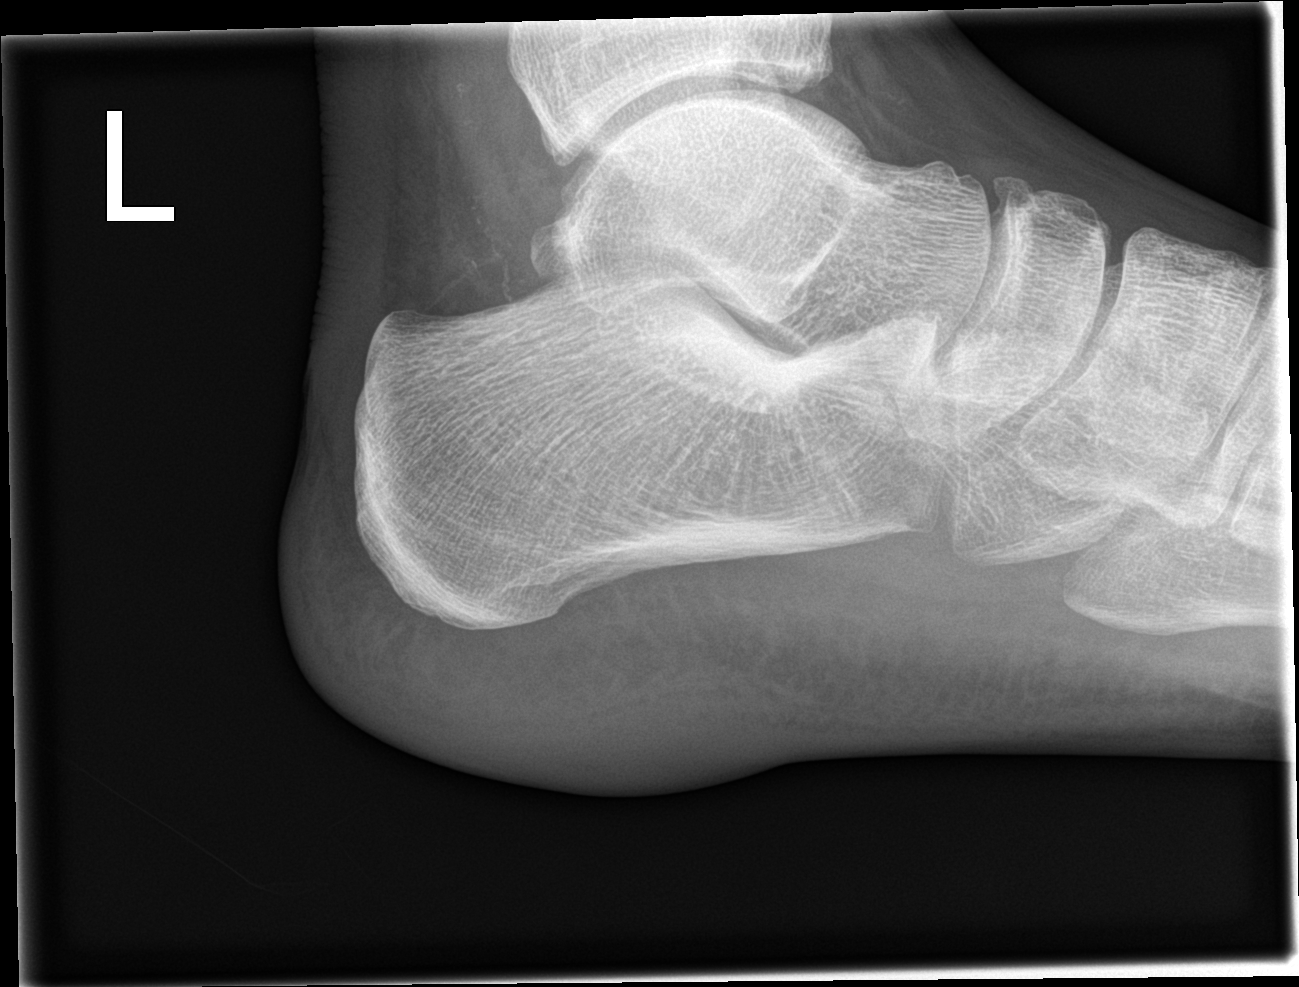

[calcaneus axial (2 of 2)]
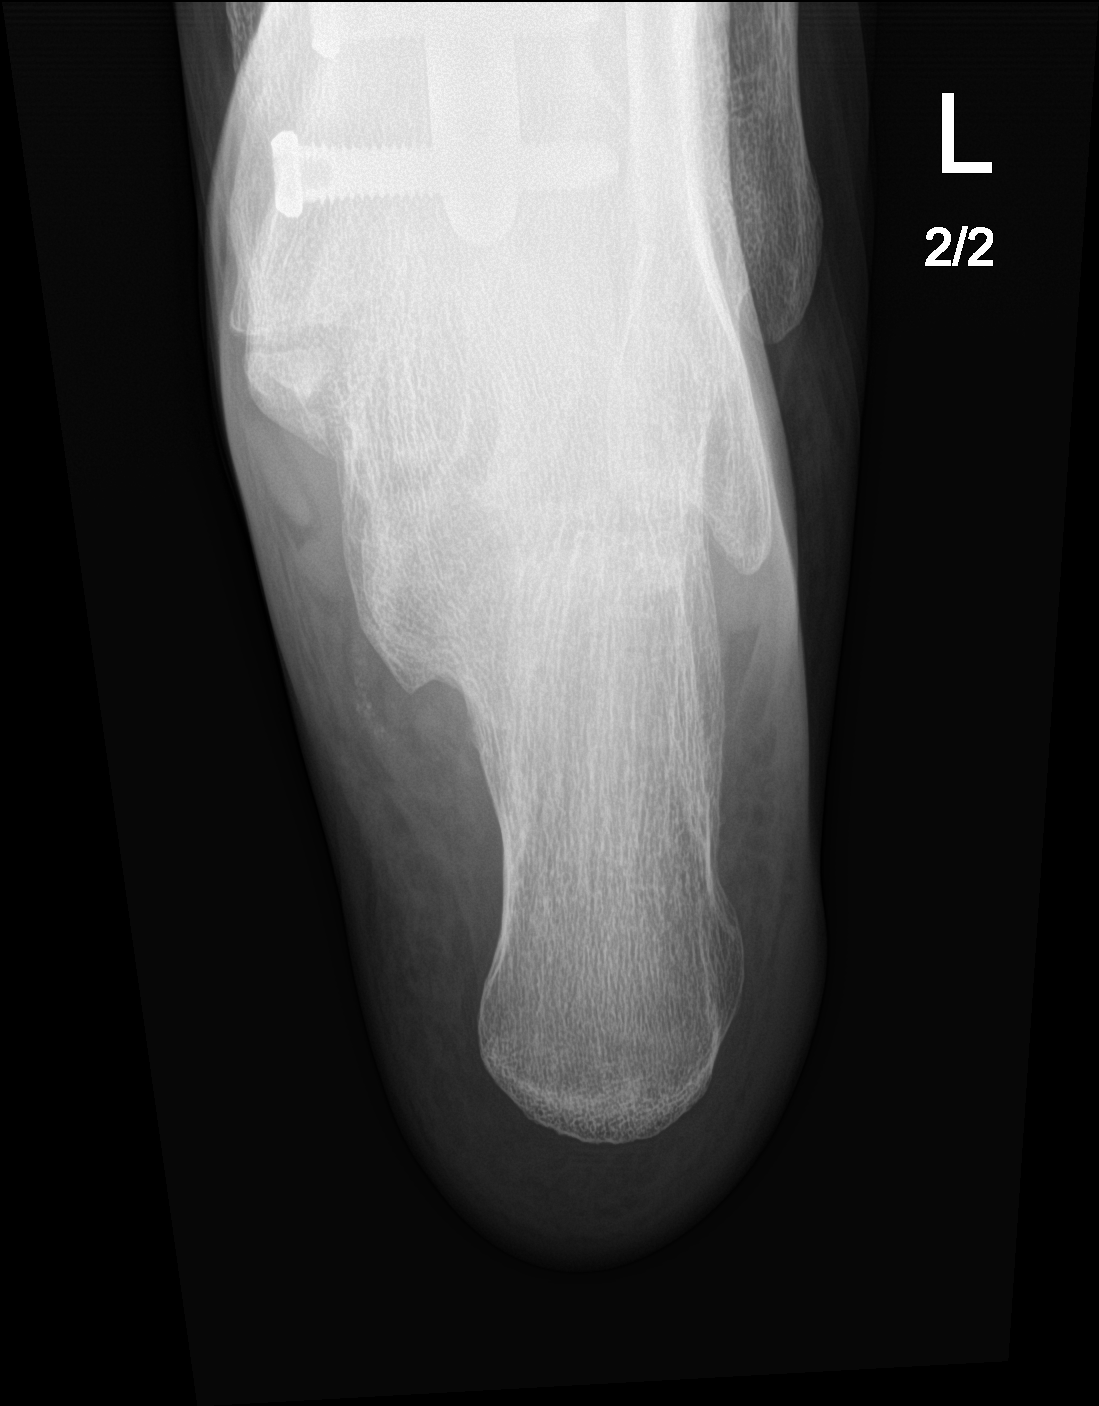

[3 of 3 positions shown; findings below may reference images not displayed]

FINDINGS: Negative for fracture or osteomyelitis. Soft tissue swelling on the
plantar surface of the heel.
IMPRESSION: Soft tissue swelling.  No bony abnormality.

## 2022-04-09 ENCOUNTER — Emergency Department (HOSPITAL_COMMUNITY): Payer: Medicaid Other

## 2022-04-09 ENCOUNTER — Inpatient Hospital Stay (HOSPITAL_COMMUNITY)
Admission: EM | Admit: 2022-04-09 | Discharge: 2022-05-06 | DRG: 853 | Disposition: A | Discharge status: Skilled Nursing Facility | Payer: Medicaid Other | Source: Skilled Nursing Facility | Attending: Internal Medicine | Admitting: Internal Medicine

## 2022-04-09 ENCOUNTER — Inpatient Hospital Stay (HOSPITAL_COMMUNITY): Payer: Medicaid Other

## 2022-04-09 ENCOUNTER — Encounter (HOSPITAL_COMMUNITY): Payer: Self-pay | Admitting: Internal Medicine

## 2022-04-09 ENCOUNTER — Other Ambulatory Visit: Payer: Self-pay

## 2022-04-09 DIAGNOSIS — Z6831 Body mass index (BMI) 31.0-31.9, adult: Secondary | ICD-10-CM

## 2022-04-09 DIAGNOSIS — M159 Polyosteoarthritis, unspecified: Secondary | ICD-10-CM | POA: Diagnosis present

## 2022-04-09 DIAGNOSIS — J189 Pneumonia, unspecified organism: Secondary | ICD-10-CM | POA: Diagnosis present

## 2022-04-09 DIAGNOSIS — E875 Hyperkalemia: Secondary | ICD-10-CM | POA: Diagnosis present

## 2022-04-09 DIAGNOSIS — R2990 Unspecified symptoms and signs involving the nervous system: Secondary | ICD-10-CM

## 2022-04-09 DIAGNOSIS — A419 Sepsis, unspecified organism: Secondary | ICD-10-CM | POA: Diagnosis present

## 2022-04-09 DIAGNOSIS — R7401 Elevation of levels of liver transaminase levels: Secondary | ICD-10-CM | POA: Diagnosis not present

## 2022-04-09 DIAGNOSIS — E1165 Type 2 diabetes mellitus with hyperglycemia: Secondary | ICD-10-CM

## 2022-04-09 DIAGNOSIS — R188 Other ascites: Secondary | ICD-10-CM | POA: Diagnosis not present

## 2022-04-09 DIAGNOSIS — Z794 Long term (current) use of insulin: Secondary | ICD-10-CM | POA: Diagnosis not present

## 2022-04-09 DIAGNOSIS — M1990 Unspecified osteoarthritis, unspecified site: Secondary | ICD-10-CM | POA: Diagnosis not present

## 2022-04-09 DIAGNOSIS — E1051 Type 1 diabetes mellitus with diabetic peripheral angiopathy without gangrene: Secondary | ICD-10-CM | POA: Diagnosis present

## 2022-04-09 DIAGNOSIS — E109 Type 1 diabetes mellitus without complications: Secondary | ICD-10-CM | POA: Diagnosis not present

## 2022-04-09 DIAGNOSIS — R4182 Altered mental status, unspecified: Secondary | ICD-10-CM | POA: Diagnosis present

## 2022-04-09 DIAGNOSIS — M109 Gout, unspecified: Secondary | ICD-10-CM | POA: Diagnosis present

## 2022-04-09 DIAGNOSIS — R609 Edema, unspecified: Secondary | ICD-10-CM | POA: Diagnosis not present

## 2022-04-09 DIAGNOSIS — N289 Disorder of kidney and ureter, unspecified: Secondary | ICD-10-CM | POA: Diagnosis not present

## 2022-04-09 DIAGNOSIS — D62 Acute posthemorrhagic anemia: Secondary | ICD-10-CM | POA: Diagnosis not present

## 2022-04-09 DIAGNOSIS — J9811 Atelectasis: Secondary | ICD-10-CM | POA: Diagnosis not present

## 2022-04-09 DIAGNOSIS — J309 Allergic rhinitis, unspecified: Secondary | ICD-10-CM | POA: Diagnosis present

## 2022-04-09 DIAGNOSIS — K5651 Intestinal adhesions [bands], with partial obstruction: Secondary | ICD-10-CM | POA: Diagnosis not present

## 2022-04-09 DIAGNOSIS — Z6833 Body mass index (BMI) 33.0-33.9, adult: Secondary | ICD-10-CM

## 2022-04-09 DIAGNOSIS — E860 Dehydration: Secondary | ICD-10-CM | POA: Diagnosis present

## 2022-04-09 DIAGNOSIS — K567 Ileus, unspecified: Secondary | ICD-10-CM | POA: Diagnosis not present

## 2022-04-09 DIAGNOSIS — E870 Hyperosmolality and hypernatremia: Secondary | ICD-10-CM | POA: Diagnosis present

## 2022-04-09 DIAGNOSIS — K219 Gastro-esophageal reflux disease without esophagitis: Secondary | ICD-10-CM | POA: Diagnosis present

## 2022-04-09 DIAGNOSIS — E1010 Type 1 diabetes mellitus with ketoacidosis without coma: Secondary | ICD-10-CM | POA: Diagnosis present

## 2022-04-09 DIAGNOSIS — K56609 Unspecified intestinal obstruction, unspecified as to partial versus complete obstruction: Secondary | ICD-10-CM | POA: Diagnosis not present

## 2022-04-09 DIAGNOSIS — J690 Pneumonitis due to inhalation of food and vomit: Secondary | ICD-10-CM | POA: Diagnosis present

## 2022-04-09 DIAGNOSIS — N179 Acute kidney failure, unspecified: Secondary | ICD-10-CM | POA: Diagnosis not present

## 2022-04-09 DIAGNOSIS — Z79899 Other long term (current) drug therapy: Secondary | ICD-10-CM

## 2022-04-09 DIAGNOSIS — E1110 Type 2 diabetes mellitus with ketoacidosis without coma: Secondary | ICD-10-CM | POA: Diagnosis present

## 2022-04-09 DIAGNOSIS — R066 Hiccough: Secondary | ICD-10-CM | POA: Diagnosis not present

## 2022-04-09 DIAGNOSIS — F17210 Nicotine dependence, cigarettes, uncomplicated: Secondary | ICD-10-CM | POA: Diagnosis present

## 2022-04-09 DIAGNOSIS — E1310 Other specified diabetes mellitus with ketoacidosis without coma: Principal | ICD-10-CM

## 2022-04-09 DIAGNOSIS — R531 Weakness: Secondary | ICD-10-CM | POA: Diagnosis not present

## 2022-04-09 DIAGNOSIS — K9189 Other postprocedural complications and disorders of digestive system: Secondary | ICD-10-CM | POA: Diagnosis not present

## 2022-04-09 DIAGNOSIS — G9341 Metabolic encephalopathy: Secondary | ICD-10-CM | POA: Diagnosis present

## 2022-04-09 DIAGNOSIS — R0609 Other forms of dyspnea: Secondary | ICD-10-CM | POA: Diagnosis not present

## 2022-04-09 DIAGNOSIS — R6510 Systemic inflammatory response syndrome (SIRS) of non-infectious origin without acute organ dysfunction: Secondary | ICD-10-CM | POA: Diagnosis not present

## 2022-04-09 DIAGNOSIS — E876 Hypokalemia: Secondary | ICD-10-CM | POA: Diagnosis not present

## 2022-04-09 DIAGNOSIS — K565 Intestinal adhesions [bands], unspecified as to partial versus complete obstruction: Secondary | ICD-10-CM | POA: Diagnosis not present

## 2022-04-09 DIAGNOSIS — E669 Obesity, unspecified: Secondary | ICD-10-CM | POA: Diagnosis present

## 2022-04-09 DIAGNOSIS — J9601 Acute respiratory failure with hypoxia: Secondary | ICD-10-CM | POA: Diagnosis present

## 2022-04-09 DIAGNOSIS — Z20822 Contact with and (suspected) exposure to covid-19: Secondary | ICD-10-CM | POA: Diagnosis present

## 2022-04-09 DIAGNOSIS — R000 Tachycardia, unspecified: Secondary | ICD-10-CM | POA: Diagnosis not present

## 2022-04-09 DIAGNOSIS — R7989 Other specified abnormal findings of blood chemistry: Secondary | ICD-10-CM | POA: Diagnosis not present

## 2022-04-09 DIAGNOSIS — R6520 Severe sepsis without septic shock: Secondary | ICD-10-CM | POA: Diagnosis present

## 2022-04-09 DIAGNOSIS — Z833 Family history of diabetes mellitus: Secondary | ICD-10-CM

## 2022-04-09 DIAGNOSIS — R29810 Facial weakness: Secondary | ICD-10-CM | POA: Diagnosis present

## 2022-04-09 DIAGNOSIS — E785 Hyperlipidemia, unspecified: Secondary | ICD-10-CM | POA: Diagnosis present

## 2022-04-09 LAB — CBG MONITORING, ED
Glucose-Capillary: >600 mg/dL (ref 70–99)
Glucose-Capillary: >600 mg/dL (ref 70–99)
Glucose-Capillary: 508 mg/dL (ref 70–99)
Glucose-Capillary: 467 mg/dL — ABNORMAL HIGH (ref 70–99)
Glucose-Capillary: 414 mg/dL — ABNORMAL HIGH (ref 70–99)
Glucose-Capillary: 391 mg/dL — ABNORMAL HIGH (ref 70–99)
Glucose-Capillary: 357 mg/dL — ABNORMAL HIGH (ref 70–99)
Glucose-Capillary: 358 mg/dL — ABNORMAL HIGH (ref 70–99)
Glucose-Capillary: 194 mg/dL — ABNORMAL HIGH (ref 70–99)
Glucose-Capillary: 254 mg/dL — ABNORMAL HIGH (ref 70–99)
Glucose-Capillary: 268 mg/dL — ABNORMAL HIGH (ref 70–99)
Glucose-Capillary: 246 mg/dL — ABNORMAL HIGH (ref 70–99)
Glucose-Capillary: 238 mg/dL — ABNORMAL HIGH (ref 70–99)
Glucose-Capillary: 239 mg/dL — ABNORMAL HIGH (ref 70–99)
Glucose-Capillary: 210 mg/dL — ABNORMAL HIGH (ref 70–99)
Glucose-Capillary: 183 mg/dL — ABNORMAL HIGH (ref 70–99)
Glucose-Capillary: 226 mg/dL — ABNORMAL HIGH (ref 70–99)
Glucose-Capillary: 177 mg/dL — ABNORMAL HIGH (ref 70–99)
Glucose-Capillary: 159 mg/dL — ABNORMAL HIGH (ref 70–99)

## 2022-04-09 LAB — DIFFERENTIAL
Abs Immature Granulocytes: 0.03 10*3/uL (ref 0.00–0.07)
Basophils Absolute: 0.1 10*3/uL (ref 0.0–0.1)
Basophils Relative: 1 %
Eosinophils Absolute: 0 10*3/uL (ref 0.0–0.5)
Eosinophils Relative: 0 %
Immature Granulocytes: 0 %
Lymphocytes Relative: 17 %
Lymphs Abs: 2.2 10*3/uL (ref 0.7–4.0)
Monocytes Absolute: 0.5 10*3/uL (ref 0.1–1.0)
Monocytes Relative: 4 %
Neutro Abs: 10 10*3/uL — ABNORMAL HIGH (ref 1.7–7.7)
Neutrophils Relative %: 78 %

## 2022-04-09 LAB — I-STAT VENOUS BLOOD GAS, ED
Acid-base deficit: 14 mmol/L — ABNORMAL HIGH (ref 0.0–2.0)
Acid-base deficit: 5 mmol/L — ABNORMAL HIGH (ref 0.0–2.0)
Bicarbonate: 9.5 mmol/L — ABNORMAL LOW (ref 20.0–28.0)
Bicarbonate: 20 mmol/L (ref 20.0–28.0)
Calcium, Ion: 1.19 mmol/L (ref 1.15–1.40)
Calcium, Ion: 1.24 mmol/L (ref 1.15–1.40)
HCT: 55 % — ABNORMAL HIGH (ref 39.0–52.0)
HCT: 47 % (ref 39.0–52.0)
Hemoglobin: 18.7 g/dL — ABNORMAL HIGH (ref 13.0–17.0)
Hemoglobin: 16 g/dL (ref 13.0–17.0)
O2 Saturation: 97 %
O2 Saturation: 100 %
Potassium: 5.5 mmol/L — ABNORMAL HIGH (ref 3.5–5.1)
Potassium: 4.4 mmol/L (ref 3.5–5.1)
Sodium: 135 mmol/L (ref 135–145)
Sodium: 146 mmol/L — ABNORMAL HIGH (ref 135–145)
TCO2: 10 mmol/L — ABNORMAL LOW (ref 22–32)
TCO2: 21 mmol/L — ABNORMAL LOW (ref 22–32)
pCO2, Ven: 20.2 mmHg — ABNORMAL LOW (ref 44–60)
pCO2, Ven: 37.4 mmHg — ABNORMAL LOW (ref 44–60)
pH, Ven: 7.28 (ref 7.25–7.43)
pH, Ven: 7.335 (ref 7.25–7.43)
pO2, Ven: 102 mmHg — ABNORMAL HIGH (ref 32–45)
pO2, Ven: 198 mmHg — ABNORMAL HIGH (ref 32–45)

## 2022-04-09 LAB — PROTEIN / CREATININE RATIO, URINE
Creatinine, Urine: 45 mg/dL
Protein Creatinine Ratio: 1.11 mg/mg{Cre} — ABNORMAL HIGH (ref 0.00–0.15)
Total Protein, Urine: 50 mg/dL

## 2022-04-09 LAB — BASIC METABOLIC PANEL
Anion gap: 15 (ref 5–15)
Anion gap: 14 (ref 5–15)
Anion gap: 9 (ref 5–15)
BUN: 29 mg/dL — ABNORMAL HIGH (ref 8–23)
BUN: 30 mg/dL — ABNORMAL HIGH (ref 8–23)
BUN: 25 mg/dL — ABNORMAL HIGH (ref 8–23)
CO2: 16 mmol/L — ABNORMAL LOW (ref 22–32)
CO2: 16 mmol/L — ABNORMAL LOW (ref 22–32)
CO2: 22 mmol/L (ref 22–32)
Calcium: 9.8 mg/dL (ref 8.9–10.3)
Calcium: 9.5 mg/dL (ref 8.9–10.3)
Calcium: 9.7 mg/dL (ref 8.9–10.3)
Chloride: 115 mmol/L — ABNORMAL HIGH (ref 98–111)
Chloride: 114 mmol/L — ABNORMAL HIGH (ref 98–111)
Chloride: 116 mmol/L — ABNORMAL HIGH (ref 98–111)
Creatinine, Ser: 2.01 mg/dL — ABNORMAL HIGH (ref 0.61–1.24)
Creatinine, Ser: 1.86 mg/dL — ABNORMAL HIGH (ref 0.61–1.24)
Creatinine, Ser: 1.49 mg/dL — ABNORMAL HIGH (ref 0.61–1.24)
GFR, Estimated: 37 mL/min — ABNORMAL LOW (ref >60)
GFR, Estimated: 40 mL/min — ABNORMAL LOW (ref >60)
GFR, Estimated: 53 mL/min — ABNORMAL LOW (ref >60)
Glucose, Bld: 263 mg/dL — ABNORMAL HIGH (ref 70–99)
Glucose, Bld: 297 mg/dL — ABNORMAL HIGH (ref 70–99)
Glucose, Bld: 217 mg/dL — ABNORMAL HIGH (ref 70–99)
Potassium: 4.4 mmol/L (ref 3.5–5.1)
Potassium: 4.5 mmol/L (ref 3.5–5.1)
Potassium: 3.9 mmol/L (ref 3.5–5.1)
Sodium: 146 mmol/L — ABNORMAL HIGH (ref 135–145)
Sodium: 144 mmol/L (ref 135–145)
Sodium: 147 mmol/L — ABNORMAL HIGH (ref 135–145)

## 2022-04-09 LAB — COMPREHENSIVE METABOLIC PANEL
ALT: 25 U/L (ref 0–44)
ALT: 21 U/L (ref 0–44)
AST: 18 U/L (ref 15–41)
AST: 16 U/L (ref 15–41)
Albumin: 4.8 g/dL (ref 3.5–5.0)
Albumin: 4 g/dL (ref 3.5–5.0)
Alkaline Phosphatase: 91 U/L (ref 38–126)
Alkaline Phosphatase: 76 U/L (ref 38–126)
Anion gap: 29 — ABNORMAL HIGH (ref 5–15)
Anion gap: 16 — ABNORMAL HIGH (ref 5–15)
BUN: 37 mg/dL — ABNORMAL HIGH (ref 8–23)
BUN: 32 mg/dL — ABNORMAL HIGH (ref 8–23)
CO2: 8 mmol/L — ABNORMAL LOW (ref 22–32)
CO2: 11 mmol/L — ABNORMAL LOW (ref 22–32)
Calcium: 10.4 mg/dL — ABNORMAL HIGH (ref 8.9–10.3)
Calcium: 9.1 mg/dL (ref 8.9–10.3)
Chloride: 100 mmol/L (ref 98–111)
Chloride: 112 mmol/L — ABNORMAL HIGH (ref 98–111)
Creatinine, Ser: 2.98 mg/dL — ABNORMAL HIGH (ref 0.61–1.24)
Creatinine, Ser: 2.36 mg/dL — ABNORMAL HIGH (ref 0.61–1.24)
GFR, Estimated: 23 mL/min — ABNORMAL LOW (ref >60)
GFR, Estimated: 30 mL/min — ABNORMAL LOW (ref >60)
Glucose, Bld: 706 mg/dL (ref 70–99)
Glucose, Bld: 429 mg/dL — ABNORMAL HIGH (ref 70–99)
Potassium: 5.3 mmol/L — ABNORMAL HIGH (ref 3.5–5.1)
Potassium: 4.7 mmol/L (ref 3.5–5.1)
Sodium: 137 mmol/L (ref 135–145)
Sodium: 139 mmol/L (ref 135–145)
Total Bilirubin: 2.2 mg/dL — ABNORMAL HIGH (ref 0.3–1.2)
Total Bilirubin: 2 mg/dL — ABNORMAL HIGH (ref 0.3–1.2)
Total Protein: 9.2 g/dL — ABNORMAL HIGH (ref 6.5–8.1)
Total Protein: 7.7 g/dL (ref 6.5–8.1)

## 2022-04-09 LAB — CBC WITH DIFFERENTIAL/PLATELET
Abs Immature Granulocytes: 0.05 10*3/uL (ref 0.00–0.07)
Basophils Absolute: 0 10*3/uL (ref 0.0–0.1)
Basophils Relative: 0 %
Eosinophils Absolute: 0 10*3/uL (ref 0.0–0.5)
Eosinophils Relative: 0 %
HCT: 48.8 % (ref 39.0–52.0)
Hemoglobin: 16.9 g/dL (ref 13.0–17.0)
Immature Granulocytes: 0 %
Lymphocytes Relative: 18 %
Lymphs Abs: 2.2 10*3/uL (ref 0.7–4.0)
MCH: 34.2 pg — ABNORMAL HIGH (ref 26.0–34.0)
MCHC: 34.6 g/dL (ref 30.0–36.0)
MCV: 98.8 fL (ref 80.0–100.0)
Monocytes Absolute: 0.4 10*3/uL (ref 0.1–1.0)
Monocytes Relative: 3 %
Neutro Abs: 9.8 10*3/uL — ABNORMAL HIGH (ref 1.7–7.7)
Neutrophils Relative %: 79 %
Platelets: 250 10*3/uL (ref 150–400)
RBC: 4.94 MIL/uL (ref 4.22–5.81)
RDW: 12.4 % (ref 11.5–15.5)
WBC: 12.5 10*3/uL — ABNORMAL HIGH (ref 4.0–10.5)
nRBC: 0 % (ref 0.0–0.2)

## 2022-04-09 LAB — I-STAT CHEM 8, ED
BUN: 41 mg/dL — ABNORMAL HIGH (ref 8–23)
Calcium, Ion: 1.22 mmol/L (ref 1.15–1.40)
Chloride: 109 mmol/L (ref 98–111)
Creatinine, Ser: 2.2 mg/dL — ABNORMAL HIGH (ref 0.61–1.24)
Glucose, Bld: >700 mg/dL (ref 70–99)
HCT: 54 % — ABNORMAL HIGH (ref 39.0–52.0)
Hemoglobin: 18.4 g/dL — ABNORMAL HIGH (ref 13.0–17.0)
Potassium: 5.4 mmol/L — ABNORMAL HIGH (ref 3.5–5.1)
Sodium: 135 mmol/L (ref 135–145)
TCO2: 10 mmol/L — ABNORMAL LOW (ref 22–32)

## 2022-04-09 LAB — RAPID URINE DRUG SCREEN, HOSP PERFORMED
Amphetamines: NOT DETECTED
Barbiturates: NOT DETECTED
Benzodiazepines: NOT DETECTED
Cocaine: NOT DETECTED
Opiates: NOT DETECTED
Tetrahydrocannabinol: NOT DETECTED

## 2022-04-09 LAB — URINALYSIS, ROUTINE W REFLEX MICROSCOPIC
Bilirubin Urine: NEGATIVE
Glucose, UA: >=500 mg/dL — AB
Ketones, ur: 80 mg/dL — AB
Leukocytes,Ua: NEGATIVE
Nitrite: NEGATIVE
Protein, ur: 100 mg/dL — AB
Specific Gravity, Urine: 1.027 (ref 1.005–1.030)
pH: 5 (ref 5.0–8.0)

## 2022-04-09 LAB — D-DIMER, QUANTITATIVE: D-Dimer, Quant: 0.47 ug/mL-FEU (ref 0.00–0.50)

## 2022-04-09 LAB — CBC
HCT: 54.2 % — ABNORMAL HIGH (ref 39.0–52.0)
Hemoglobin: 18.2 g/dL — ABNORMAL HIGH (ref 13.0–17.0)
MCH: 34 pg (ref 26.0–34.0)
MCHC: 33.6 g/dL (ref 30.0–36.0)
MCV: 101.3 fL — ABNORMAL HIGH (ref 80.0–100.0)
Platelets: 249 10*3/uL (ref 150–400)
RBC: 5.35 MIL/uL (ref 4.22–5.81)
RDW: 12.5 % (ref 11.5–15.5)
WBC: 12.8 10*3/uL — ABNORMAL HIGH (ref 4.0–10.5)
nRBC: 0 % (ref 0.0–0.2)

## 2022-04-09 LAB — PROTIME-INR
INR: 1.2 (ref 0.8–1.2)
Prothrombin Time: 14.6 seconds (ref 11.4–15.2)

## 2022-04-09 LAB — TSH: TSH: 0.205 u[IU]/mL — ABNORMAL LOW (ref 0.350–4.500)

## 2022-04-09 LAB — HEMOGLOBIN A1C
Hgb A1c MFr Bld: 11.7 % — ABNORMAL HIGH (ref 4.8–5.6)
Mean Plasma Glucose: 289.09 mg/dL

## 2022-04-09 LAB — BETA-HYDROXYBUTYRIC ACID
Beta-Hydroxybutyric Acid: >8 mmol/L — ABNORMAL HIGH (ref 0.05–0.27)
Beta-Hydroxybutyric Acid: 4.98 mmol/L — ABNORMAL HIGH (ref 0.05–0.27)
Beta-Hydroxybutyric Acid: 1.71 mmol/L — ABNORMAL HIGH (ref 0.05–0.27)

## 2022-04-09 LAB — RESP PANEL BY RT-PCR (FLU A&B, COVID) ARPGX2
Influenza A by PCR: NEGATIVE
Influenza B by PCR: NEGATIVE
SARS Coronavirus 2 by RT PCR: NEGATIVE

## 2022-04-09 LAB — MAGNESIUM: Magnesium: 2.9 mg/dL — ABNORMAL HIGH (ref 1.7–2.4)

## 2022-04-09 LAB — PHOSPHORUS: Phosphorus: 3.2 mg/dL (ref 2.5–4.6)

## 2022-04-09 LAB — CREATININE, URINE, RANDOM: Creatinine, Urine: 45 mg/dL

## 2022-04-09 LAB — PROCALCITONIN: Procalcitonin: 0.34 ng/mL

## 2022-04-09 LAB — T4, FREE: Free T4: 0.76 ng/dL (ref 0.61–1.12)

## 2022-04-09 LAB — ETHANOL: Alcohol, Ethyl (B): <10 mg/dL (ref <10)

## 2022-04-09 LAB — GLUCOSE, CAPILLARY: Glucose-Capillary: 133 mg/dL — ABNORMAL HIGH (ref 70–99)

## 2022-04-09 LAB — LACTIC ACID, PLASMA: Lactic Acid, Venous: 1.1 mmol/L (ref 0.5–1.9)

## 2022-04-09 LAB — AMMONIA: Ammonia: 32 umol/L (ref 9–35)

## 2022-04-09 LAB — APTT: aPTT: 27 seconds (ref 24–36)

## 2022-04-09 LAB — SODIUM, URINE, RANDOM: Sodium, Ur: 42 mmol/L

## 2022-04-09 MED ORDER — LACTATED RINGERS IV SOLN: INTRAVENOUS | Status: DC

## 2022-04-09 MED ORDER — LACTATED RINGERS IV BOLUS
20.0000 mL/kg | Freq: Once | INTRAVENOUS | Status: AC
  Administered 2022-04-09: 2068 mL via INTRAVENOUS

## 2022-04-09 MED ORDER — INSULIN REGULAR(HUMAN) IN NACL 100-0.9 UT/100ML-% IV SOLN
INTRAVENOUS | Status: DC
Start: 2022-04-09 — End: 2022-04-10
  Administered 2022-04-09: 7.5 [IU]/h via INTRAVENOUS
  Administered 2022-04-09: 4.6 [IU]/h via INTRAVENOUS
  Administered 2022-04-09: 7.5 [IU]/h via INTRAVENOUS
  Filled 2022-04-09: qty 100

## 2022-04-09 MED ORDER — SODIUM CHLORIDE 0.9 % IV SOLN
3.0000 g | Freq: Three times a day (TID) | INTRAVENOUS | Status: DC
  Administered 2022-04-09 – 2022-04-14 (×15): 3 g via INTRAVENOUS
  Filled 2022-04-09 (×17): qty 8

## 2022-04-09 MED ORDER — DEXTROSE IN LACTATED RINGERS 5 % IV SOLN: INTRAVENOUS | Status: DC

## 2022-04-09 MED ORDER — INSULIN GLARGINE-YFGN 100 UNIT/ML ~~LOC~~ SOLN
30.0000 [IU] | Freq: Every day | SUBCUTANEOUS | Status: DC
  Administered 2022-04-09: 30 [IU] via SUBCUTANEOUS
  Filled 2022-04-09 (×3): qty 0.3

## 2022-04-09 MED ORDER — DEXTROSE 50 % IV SOLN: 0.0000 mL | INTRAVENOUS | Status: DC | PRN

## 2022-04-09 MED ORDER — INSULIN ASPART 100 UNIT/ML IJ SOLN
0.0000 [IU] | INTRAMUSCULAR | Status: DC
  Administered 2022-04-09 – 2022-04-10 (×2): 3 [IU] via SUBCUTANEOUS
  Administered 2022-04-10: 15 [IU] via SUBCUTANEOUS
  Administered 2022-04-10: 2 [IU] via SUBCUTANEOUS
  Administered 2022-04-10: 3 [IU] via SUBCUTANEOUS

## 2022-04-09 MED ORDER — INSULIN REGULAR(HUMAN) IN NACL 100-0.9 UT/100ML-% IV SOLN
INTRAVENOUS | Status: DC
  Administered 2022-04-09: 5 [IU]/h via INTRAVENOUS
  Filled 2022-04-09: qty 100

## 2022-04-09 MED ORDER — ACETAMINOPHEN 325 MG PO TABS: 650.0000 mg | ORAL_TABLET | Freq: Four times a day (QID) | ORAL | Status: DC | PRN

## 2022-04-09 MED ORDER — ONDANSETRON HCL 4 MG/2ML IJ SOLN
4.0000 mg | Freq: Four times a day (QID) | INTRAMUSCULAR | Status: DC | PRN
  Administered 2022-04-12 – 2022-04-29 (×8): 4 mg via INTRAVENOUS
  Filled 2022-04-09 (×10): qty 2

## 2022-04-09 MED ORDER — LACTATED RINGERS IV BOLUS
1000.0000 mL | Freq: Once | INTRAVENOUS | Status: AC
  Administered 2022-04-09: 1000 mL via INTRAVENOUS

## 2022-04-09 MED ORDER — NICOTINE 14 MG/24HR TD PT24: 14.0000 mg | MEDICATED_PATCH | Freq: Every day | TRANSDERMAL | Status: DC | PRN

## 2022-04-09 MED ORDER — ACETAMINOPHEN 650 MG RE SUPP: 650.0000 mg | Freq: Four times a day (QID) | RECTAL | Status: DC | PRN

## 2022-04-09 NOTE — Procedures (Signed)
Patient Name: Cody [name]  MRN: [id number]  Epilepsy Attending: Charlsie Quest  Referring Physician/Provider: Gordy Councilman, MD  Date: 04/09/2022 Duration: 21.59 mins  Patient history: 61 year old male with altered mental status.  EEG to evaluate for seizure.  Level of alertness: Awake, asleep  AEDs during EEG study: None  Technical aspects: This EEG study was done with scalp electrodes positioned according to the 10-20 International system of electrode placement. Electrical activity was reviewed with band pass filter of 1-70Hz , sensitivity of 7 uV/mm, display speed of 99mm/sec with a 60Hz  notched filter applied as appropriate. EEG data were recorded continuously and digitally stored.  Video monitoring was available and reviewed as appropriate.  Description: The posterior dominant rhythm consists of 7 Hz activity of moderate voltage (25-35 uV) seen predominantly in posterior head regions, symmetric and reactive to eye opening and eye closing. Sleep was characterized by vertex waves, sleep spindles (12 to 14 Hz), maximal frontocentral region.  Hyperventilation and photic stimulation were not performed.     EKG artifact was seen throughout the study.  ABNORMALITY -Background slow  IMPRESSION: This study is suggestive of mild diffuse encephalopathy, nonspecific to etiology.  No seizures or epileptiform discharges were seen throughout the recording.    [name] 
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
## 2022-04-09 NOTE — Progress Notes (Signed)
EEG complete - results pending 

## 2022-04-09 NOTE — ED Notes (Signed)
RN and neurology transporting pt to MRI, 1L NS started per Mesner MD.

## 2022-04-09 NOTE — Progress Notes (Signed)
Pharmacy Antibiotic Note  Cody [name] is a 61 y.o. male admitted on 04/09/2022 with  aspiration PNA .  Pharmacy has been consulted for Unasyn dosing. AKI - SCr trend down to 2.36 (prior value 1.22 in June 2019).  Plan: Unasyn 3g IV q8h Monitor clinical progress, c/s, renal function F/u de-escalation plan/LOT  Weight: 103.4 kg (227 lb 15.3 oz)  Temp (24hrs), Avg:98.2 F (36.8 C), Min:97.9 F (36.6 C), Max:98.4 F (36.9 C)  Recent Labs  Lab 04/09/22 0119 04/09/22 0121 04/09/22 0406 04/09/22 0515  WBC 12.8*  --  12.5*  --   CREATININE 2.98* 2.20*  --  2.36*    CrCl cannot be calculated (Unknown ideal weight.).    No Known Allergies   Leia Alf, PharmD, BCPS Please check AMION for all [facility] Pharm[facility] contact numbers Clinical Pharmacist 04/09/2022 10:44 AM

## 2022-04-09 NOTE — Progress Notes (Signed)
[facility] PLAN OF CARE NOTE Patient: Cody [name] ERD:[id number]   PCP: Marcine Matar, MD DOB: May 09, 1961   DOA: 04/09/2022   DOS: 04/09/2022    Patient was admitted by my colleague earlier on 04/09/2022. I have reviewed the H&P as well as assessment and plan and agree with the same. Important changes in the plan are listed below. Subjective: Denies any acute complaint.  No vomiting no fever no chills.  Mentation clear.  Sluggish response  Objective:  Vitals:   04/09/22 1158 04/09/22 1330 04/09/22 1617 04/09/22 1630  BP:  128/87  128/81  Pulse:  (!) 105  (!) 104  Resp:  12  13  Temp: (!) 96.3 F (35.7 C)  (!) 96.1 F (35.6 C)   TempSrc: Temporal  Temporal   SpO2: 93% 94%  96%  Weight:      Height:       General: Appear in mild distress; Cardiovascular: S1 and S2 Present, no Murmur, Respiratory: good respiratory effort, Bilateral Air entry present, CTA, no Crackles, no wheezes Abdomen: Bowel Sound present, Non tender, distended Extremities: no Pedal edema Neurology: alert and oriented to time, place, and person, nonfocal exam  Assessment and plan: DKA. Anion gap closed x2. Beta hydroxybutyrate consulted improving as well. For now I will continue LR at 100 mm/h. Transition to basal bolus regimen. Semglee 30 units to avoid potential hypoglycemia as the patient appears to be going to have poor p.o. intake. Diet will be advanced to dysphagia 1 diet only.  Speech therapy consult pending. Discontinue IV insulin drip as well as D 5 LR  Acute hypoxic respiratory failure. Currently on 10 L of oxygen saturating 94%. Etiology is not clear based on the chest x-ray. Examination is also not revealing. Appears to have progressive decline in respiratory status. Influenza and COVID is negative. Procalcitonin minimally elevated aspiration pneumonia suspected based on the chest x-ray currently on IV Unasyn. We will get CT chest without contrast to ensure no other acute  abnormality. Unable to perform with contrast study right now due to renal function. Check echocardiogram to rule out a shunt.  Acute kidney injury. In the setting of DKA. Monitor.  Author: Lynden Oxford, MD [facility] 04/09/2022 6:36 PM   If 7PM-7AM, please contact night-coverage at [email or web address]

## 2022-04-09 NOTE — Inpatient Diabetes Management (Signed)
Inpatient Diabetes Program Recommendations  AACE/ADA: New Consensus Statement on Inpatient Glycemic Control (2015)  Target Ranges:  Prepandial:   less than 140 mg/dL      Peak postprandial:   less than 180 mg/dL (1-2 hours)      Critically ill patients:  140 - 180 mg/dL   Lab Results  Component Value Date   GLUCAP 239 (H) 04/09/2022   HGBA1C 11.7 (H) 04/09/2022    Review of Glycemic Control  Diabetes history: New Diagnosis Diabetes this admission  Current orders for Inpatient glycemic control:  IV insulin/Endotool/DKA  Inpatient Diabetes Program Recommendations:    IV insulin at 8 units/hour  At time of transition consider -  Semglee 35 units -  Novolog 0-15 units Q4 hours  Attempted to speak with pt at bedside. Pt very sleepy will follow up at a later date. Note pt from ALF and may receive medication help there. Pt will need lifestyle modification teaching.   Thanks,  Christena Deem RN, MSN, BC-ADM Inpatient Diabetes Coordinator Team Pager [phone] (8a-5p)

## 2022-04-09 NOTE — ED Provider Notes (Signed)
[facility] EMERGENCY DEPARTMENT Provider Note   CSN: [id number] Arrival date & time: 04/09/22  2297  An emergency department physician performed an initial assessment on this suspected stroke patient at 0113.  History  Chief Complaint  Patient presents with   Code Stroke   Hyperglycemia    Cody [name] is a 61 y.o. male.  Here with EMS as a code stroke. In an ALF, had last seen normal approx 1 hour PTA when he drank some water. Now with facial droop, altered, hypoxic, tachycardic. Patient not able to give history at this time.        Home Medications Prior to Admission medications   Medication Sig Start Date End Date Taking? Authorizing Provider  benzonatate (TESSALON) 100 MG capsule Take 1 capsule (100 mg total) by mouth 2 (two) times daily as needed for cough. 10/07/17   Marcine Matar, MD  cetirizine (ZYRTEC) 10 MG tablet Take 1 tablet (10 mg total) by mouth daily. 11/22/16   [name], Gerilyn Nestle, MD  diclofenac sodium (VOLTAREN) 1 % GEL Apply 2 g topically 4 (four) times daily. 10/07/17   Marcine Matar, MD  diphenhydrAMINE (BENADRYL) 25 MG tablet Take 1 tablet (25 mg total) by mouth every 6 (six) hours. 11/20/17   Law, Waylan Boga, PA-C  famotidine (PEPCID) 20 MG tablet Take 1 tablet (20 mg total) by mouth 2 (two) times daily. 11/20/17   Law, Waylan Boga, PA-C  methocarbamol (ROBAXIN) 750 MG tablet Take 1 tablet (750 mg total) by mouth 4 (four) times daily. 02/12/17   Dessa Phi, MD  Misc. Devices (CANE) MISC 1 each by Does not apply route daily. 02/12/17   [name], Gerilyn Nestle, MD  Multiple Vitamins-Minerals (MULTIVITAMIN ADULT) TABS Take 1 tablet by mouth daily. 10/07/17   Marcine Matar, MD  omeprazole (PRILOSEC) 20 MG capsule Take 1 capsule (20 mg total) by mouth 2 (two) times daily before a meal. 09/27/16   [name], Gerilyn Nestle, MD  thiamine 100 MG tablet Take 1 tablet (100 mg total) by mouth daily. 09/27/16   Dessa Phi, MD      Allergies     Patient has no known allergies.    Review of Systems   Review of Systems  Physical Exam Updated Vital Signs BP (!) 146/93   Pulse (!) 126   Temp 97.9 F (36.6 C) (Temporal)   Resp (!) 30   Wt 103.4 kg   SpO2 92%   BMI 31.79 kg/m  Physical Exam Vitals and nursing note reviewed.  Constitutional:      Appearance: He is well-developed.  HENT:     Head: Normocephalic and atraumatic.     Mouth/Throat:     Mouth: Mucous membranes are dry.  Eyes:     Pupils: Pupils are equal, round, and reactive to light.  Cardiovascular:     Rate and Rhythm: Tachycardia present.  Pulmonary:     Effort: Pulmonary effort is normal. No respiratory distress.  Abdominal:     General: There is no distension.  Musculoskeletal:        General: Normal range of motion.     Cervical back: Normal range of motion.  Neurological:     Mental Status: He is alert.     Comments: Left facial droop. Attempts to Follows commands.     ED Results / Procedures / Treatments   Labs (all labs ordered are listed, but only abnormal results are displayed) Labs Reviewed  CBC - Abnormal; Notable for the following components:  Result Value   WBC 12.8 (*)    Hemoglobin 18.2 (*)    HCT 54.2 (*)    MCV 101.3 (*)    All other components within normal limits  DIFFERENTIAL - Abnormal; Notable for the following components:   Neutro Abs 10.0 (*)    All other components within normal limits  COMPREHENSIVE METABOLIC PANEL - Abnormal; Notable for the following components:   Potassium 5.3 (*)    CO2 8 (*)    Glucose, Bld 706 (*)    BUN 37 (*)    Creatinine, Ser 2.98 (*)    Calcium 10.4 (*)    Total Protein 9.2 (*)    Total Bilirubin 2.2 (*)    GFR, Estimated 23 (*)    Anion gap 29 (*)    All other components within normal limits  CBG MONITORING, ED - Abnormal; Notable for the following components:   Glucose-Capillary >600 (*)    All other components within normal limits  I-STAT CHEM 8, ED - Abnormal; Notable  for the following components:   Potassium 5.4 (*)    BUN 41 (*)    Creatinine, Ser 2.20 (*)    Glucose, Bld >700 (*)    TCO2 10 (*)    Hemoglobin 18.4 (*)    HCT 54.0 (*)    All other components within normal limits  I-STAT VENOUS BLOOD GAS, ED - Abnormal; Notable for the following components:   pCO2, Ven 20.2 (*)    pO2, Ven 102 (*)    Bicarbonate 9.5 (*)    TCO2 10 (*)    Acid-base deficit 14.0 (*)    Potassium 5.5 (*)    HCT 55.0 (*)    Hemoglobin 18.7 (*)    All other components within normal limits  CBG MONITORING, ED - Abnormal; Notable for the following components:   Glucose-Capillary >600 (*)    All other components within normal limits  RESP PANEL BY RT-PCR (FLU A&B, COVID) ARPGX2  ETHANOL  PROTIME-INR  APTT  RAPID URINE DRUG SCREEN, HOSP PERFORMED  URINALYSIS, ROUTINE W REFLEX MICROSCOPIC  BETA-HYDROXYBUTYRIC ACID    EKG None  Radiology DG Chest Portable 1 View  Result Date: 04/09/2022 CLINICAL DATA:  Left facial droop and weakness, initial encounter EXAM: PORTABLE CHEST 1 VIEW COMPARISON:  01/17/2018 FINDINGS: The heart size and mediastinal contours are within normal limits. Both lungs are clear. The visualized skeletal structures are unremarkable. IMPRESSION: No active disease. Electronically Signed   By: Alcide Clever M.D.   On: 04/09/2022 02:15   MR BRAIN WO CONTRAST  Result Date: 04/09/2022 CLINICAL DATA:  Stroke follow-up EXAM: MRI HEAD WITHOUT CONTRAST TECHNIQUE: Multiplanar, multiecho pulse sequences of the brain and surrounding structures were obtained without intravenous contrast. Five series are provided. COMPARISON:  None Available. FINDINGS: Tailored stroke protocol was used, consisting of axial and coronal diffusion-weighted imaging and axial FLAIR. There is no acute infarct. No focal parenchymal lesion. CSF spaces are normal. IMPRESSION: No acute infarct. Electronically Signed   By: Deatra Robinson M.D.   On: 04/09/2022 02:03   CT HEAD CODE STROKE WO  CONTRAST  Result Date: 04/09/2022 CLINICAL DATA:  Code stroke.  Acute neurologic deficit EXAM: CT HEAD WITHOUT CONTRAST TECHNIQUE: Contiguous axial images were obtained from the base of the skull through the vertex without intravenous contrast. RADIATION DOSE REDUCTION: This exam was performed according to the departmental dose-optimization program which includes automated exposure control, adjustment of the mA and/or kV according to patient size and/or use  of iterative reconstruction technique. COMPARISON:  01/17/2018 FINDINGS: Brain: There is no mass, hemorrhage or extra-axial collection. The size and configuration of the ventricles and extra-axial CSF spaces are normal. The brain parenchyma is normal, without evidence of acute or chronic infarction. Vascular: No abnormal hyperdensity of the major intracranial arteries or dural venous sinuses. No intracranial atherosclerosis. Skull: The visualized skull base, calvarium and extracranial soft tissues are normal. Sinuses/Orbits: No fluid levels or advanced mucosal thickening of the visualized paranasal sinuses. No mastoid or middle ear effusion. The orbits are normal. ASPECTS [facility] Stroke Program Early CT Score) - Ganglionic level infarction (caudate, lentiform nuclei, internal capsule, insula, M1-M3 cortex): 7 - Supraganglionic infarction (M4-M6 cortex): 3 Total score (0-10 with 10 being normal): 10 IMPRESSION: 1. No acute intracranial abnormality. 2. ASPECTS is 10. These results were called by telephone at the time of interpretation on 04/09/2022 at 1:47 am to provider [facility] , who verbally acknowledged these results. Electronically Signed   By: Deatra Robinson M.D.   On: 04/09/2022 01:47    Procedures .Critical Care  Performed by: Marily Memos, MD Authorized by: Marily Memos, MD   Critical care provider statement:    Critical care time (minutes):  30   Critical care was necessary to treat or prevent imminent or life-threatening deterioration  of the following conditions:  Metabolic crisis   Critical care was time spent personally by me on the following activities:  Development of treatment plan with patient or surrogate, discussions with consultants, evaluation of patient's response to treatment, examination of patient, ordering and review of laboratory studies, ordering and review of radiographic studies, ordering and performing treatments and interventions, pulse oximetry, re-evaluation of patient's condition and review of old charts     Medications Ordered in ED Medications  insulin regular, human (MYXREDLIN) 100 units/ 100 mL infusion (5 Units/hr Intravenous New Bag/Given 04/09/22 0229)  lactated ringers infusion ( Intravenous New Bag/Given 04/09/22 0235)  dextrose 5 % in lactated ringers infusion (has no administration in time range)  dextrose 50 % solution 0-50 mL (has no administration in time range)  lactated ringers bolus 1,000 mL (0 mLs Intravenous Stopped 04/09/22 0236)  lactated ringers bolus 2,068 mL (2,068 mLs Intravenous New Bag/Given 04/09/22 0237)    ED Course/ Medical Decision Making/ A&P                           Medical Decision Making Amount and/or Complexity of Data Reviewed Labs: ordered. Radiology: ordered.  Risk Prescription drug management. Decision regarding hospitalization.  Istat chem8 shows glucose >700 and bicarb of 10. Will add on DKA labs, fluids, insulin. This could be resultant from new CVA vs causes for AMS/tachycardia. Ct ok, mri pending but DKA confirmed on labs (bicarb 8, glucose>700, pH 7.28). also has what appers to be an AKI, likely related to dehydration.  Insulin started. Fluids already started, more added. MS unchanged. Likely needs progressive.  Brother arrived and states that he asked facility to check for DM as patietn has been drinking a lot of fluids the last couple months but is unsure if any workup has been done or not. Patient more responsive, HR improving. Will consult  medicine.  CXR done and showed no infection, edema (independently viewed and interpreted by myself and radiology read reviewed). CT head done and showed no acute stroke (independently viewed and interpreted by myself and radiology read reviewed). MRI head done and showed no acute infarct (independently viewed and interpreted  by myself and radiology read reviewed).   Final Clinical Impression(s) / ED Diagnoses Final diagnoses:  Diabetic ketoacidosis without coma associated with other specified diabetes mellitus [facility])    Rx / DC Orders ED Discharge Orders     None         Demyah Smyre, Barbara Cower, MD 04/09/22 [phone]

## 2022-04-09 NOTE — ED Notes (Signed)
Notified Dr. Allena Katz that patient's O2 remains lower in the 88-89%. Titrated O2 to 12 L via HFNC, however VBG obtained and awaiting results. Patient still not appearing to be in any visible respiratory distress.

## 2022-04-09 NOTE — ED Notes (Signed)
I-stat VBG results given to Dr.Mesner.

## 2022-04-09 NOTE — Progress Notes (Signed)
SLP Cancellation Note  Patient Details Name: Cody [name] MRN: [id number] DOB: [birth year]   Cancelled treatment:       Reason Eval/Treat Not Completed: Medical issues which prohibited therapy  Unable to complete BSE at this time. Consulted with RN who reports pt is currently on Endo[employer] Glucose Management System, and cannot have PO intake while that is ongoing. In addition, pt is currently lethargic. Will continue efforts.  Keddrick Wyne B. Murvin Natal, [facility], CCC-SLP Speech Language Pathologist Office: [phone]  Leigh Aurora 04/09/2022, 1:51 PM

## 2022-04-09 NOTE — ED Notes (Signed)
Pt repositioned in bed.

## 2022-04-09 NOTE — ED Notes (Signed)
Went into pt's room and noticed that someone had shut off pt's insulin gtt. Restarted insulin at same rate as previous. CBG to be obtained and insulin gtt adjusted per endotool. Notified CN and Allena Katz MD

## 2022-04-09 NOTE — Code Documentation (Signed)
Stroke Response Nurse Documentation Code Documentation  Cody [name] is a 61 y.o. male arriving to [facility]  via [facility] on 8/28 with past medical hx of PVD, polysubstance abuse, ETOH, tobacco. On No antithrombotic. Code stroke was activated by EMS.   Patient from [name] where he was LKW at [facility] and now complaining of left facial droop and left sided weakness.  Stroke team at the bedside on patient arrival. Labs drawn and patient cleared for CT by Dr. Erin Hearing. Patient to CT with team. NIHSS 8, see documentation for details and code stroke times. Patient with decreased LOC, disoriented, bilateral leg weakness, Expressive aphasia , and dysarthria  on exam. The following imaging was completed:  CT Head and MRI. Patient is not a candidate for IV Thrombolytic due to MRI negative. Patient is not a candidate for IR due to MRI negative.    Bedside handoff with ED RN McKenzie.    [name]  Rapid Response RN

## 2022-04-09 NOTE — ED Triage Notes (Signed)
Pt arrives as Code Stroke via [employer] from [facility]. Per staff pt is ambulatory at  baseline and is mostly independent in caring for himself. Per the nurse at facility she last saw pt at his normal at 2300, but a tech gave him water at midnight. Upon EMS arrival pt noted to have L side facial droop, L side weakness, slurred speech and AMS. Pt unable to follow commands, incontinent of urine. 144/90, 140HR, 90% RA ems placed pt on 2L O2 via nasal cannula. Cbg 459 no hx of diabetes

## 2022-04-09 NOTE — ED Notes (Signed)
Notified Dr. Allena Katz that patient is requiring O2 at 5 L [place] and satting 90-92% and tachycardic in the 115-120s after fluid resuscitation. Patient does not complain of difficulty breathing at this time. Respirations even and unlabored. Dr. Allena Katz to place a chest xray.

## 2022-04-10 ENCOUNTER — Inpatient Hospital Stay (HOSPITAL_COMMUNITY): Payer: Medicaid Other

## 2022-04-10 DIAGNOSIS — R0609 Other forms of dyspnea: Secondary | ICD-10-CM

## 2022-04-10 DIAGNOSIS — E1110 Type 2 diabetes mellitus with ketoacidosis without coma: Secondary | ICD-10-CM | POA: Diagnosis not present

## 2022-04-10 LAB — BASIC METABOLIC PANEL
Anion gap: 9 (ref 5–15)
BUN: 21 mg/dL (ref 8–23)
CO2: 22 mmol/L (ref 22–32)
Calcium: 9.4 mg/dL (ref 8.9–10.3)
Chloride: 117 mmol/L — ABNORMAL HIGH (ref 98–111)
Creatinine, Ser: 1.18 mg/dL (ref 0.61–1.24)
GFR, Estimated: >60 mL/min (ref >60)
Glucose, Bld: 141 mg/dL — ABNORMAL HIGH (ref 70–99)
Potassium: 3.8 mmol/L (ref 3.5–5.1)
Sodium: 148 mmol/L — ABNORMAL HIGH (ref 135–145)

## 2022-04-10 LAB — GLUCOSE, CAPILLARY
Glucose-Capillary: 124 mg/dL — ABNORMAL HIGH (ref 70–99)
Glucose-Capillary: 154 mg/dL — ABNORMAL HIGH (ref 70–99)
Glucose-Capillary: 168 mg/dL — ABNORMAL HIGH (ref 70–99)
Glucose-Capillary: 315 mg/dL — ABNORMAL HIGH (ref 70–99)
Glucose-Capillary: 362 mg/dL — ABNORMAL HIGH (ref 70–99)
Glucose-Capillary: 418 mg/dL — ABNORMAL HIGH (ref 70–99)

## 2022-04-10 LAB — ECHOCARDIOGRAM COMPLETE
Area-P 1/2: 2.8 cm2
Height: 71 in
S' Lateral: 2.5 cm
Weight: 3534.41 oz

## 2022-04-10 LAB — PROCALCITONIN: Procalcitonin: 0.27 ng/mL

## 2022-04-10 LAB — ANTI-ISLET CELL ANTIBODY: Pancreatic Islet Cell Antibody: NEGATIVE

## 2022-04-10 LAB — C-PEPTIDE: C-Peptide: 0.6 ng/mL — ABNORMAL LOW (ref 1.1–4.4)

## 2022-04-10 MED ORDER — INSULIN ASPART 100 UNIT/ML IJ SOLN
0.0000 [IU] | Freq: Three times a day (TID) | INTRAMUSCULAR | Status: DC
  Administered 2022-04-11: 15 [IU] via SUBCUTANEOUS
  Administered 2022-04-11: 7 [IU] via SUBCUTANEOUS
  Administered 2022-04-11: 15 [IU] via SUBCUTANEOUS
  Administered 2022-04-12: 4 [IU] via SUBCUTANEOUS
  Administered 2022-04-12: 7 [IU] via SUBCUTANEOUS
  Administered 2022-04-12: 4 [IU] via SUBCUTANEOUS

## 2022-04-10 MED ORDER — INSULIN ASPART 100 UNIT/ML IJ SOLN
7.0000 [IU] | Freq: Once | INTRAMUSCULAR | Status: AC
  Administered 2022-04-10: 7 [IU] via SUBCUTANEOUS

## 2022-04-10 MED ORDER — INSULIN GLARGINE-YFGN 100 UNIT/ML ~~LOC~~ SOLN
35.0000 [IU] | Freq: Every day | SUBCUTANEOUS | Status: DC
  Administered 2022-04-10 – 2022-04-11 (×2): 35 [IU] via SUBCUTANEOUS
  Filled 2022-04-10 (×3): qty 0.35

## 2022-04-10 MED ORDER — INSULIN STARTER KIT- PEN NEEDLES (ENGLISH)
1.0000 | Freq: Once | Status: DC
Start: 2022-04-10 — End: 2022-05-06
  Filled 2022-04-10: qty 1

## 2022-04-10 MED ORDER — LIVING WELL WITH DIABETES BOOK
Freq: Once | Status: AC
Start: 2022-04-10 — End: 2022-04-11
  Filled 2022-04-10: qty 1

## 2022-04-10 MED ORDER — INSULIN ASPART 100 UNIT/ML IJ SOLN
0.0000 [IU] | Freq: Every day | INTRAMUSCULAR | Status: DC
  Administered 2022-04-10: 4 [IU] via SUBCUTANEOUS
  Administered 2022-04-11: 2 [IU] via SUBCUTANEOUS

## 2022-04-10 NOTE — Progress Notes (Addendum)
  Progress Note Patient: Cody [name] YQI:[id number] DOB: April 11, 1961 DOA: 04/09/2022  DOS: the patient was seen and examined on 04/10/2022  Brief hospital course: PMH of GERD, allergic rhinitis presents with confusion found to have AKI with type II DM newly diagnosed with DKA.  Treated with IV fluids.  There was initially concern for code stroke although work-up was completely unremarkable.  Mentation improving.  Oxygenation is severely low suspected from aspiration pneumonia. Assessment and Plan: DKA (diabetic ketoacidosis) (HCC) New onset diabetes mellitus. Suspecting type 2 diabetes mellitus uncontrolled with hyperglycemia. CBGs severely elevated.  Anion gap elevated. Treated with IV fluids, IV insulin. Currently anion gap is closed.  Patient is on a basal bolus regimen. We will transition from every 4 hours regimen to ACHS regimen. Patient was on 30 units of Lantus we will advance to 35 units of Lantus. Continue resistant sliding scale. Work-up initiated to identify other etiologies of diabetes mellitus although suspect this is mostly type 2 diabetes mellitus.  Acute kidney injury. Renal function at baseline normal.  Significantly elevated at the time of admission. Currently normal normal again. We will stop IV fluid and monitor.  Acute metabolic encephalopathy. Strokelike symptoms. Initially presented as a code stroke.  Neurology was consulted.  No concern for stroke suspect metabolic encephalopathy. Mentation improving. EEG unremarkable. Currently no further work-up.  Acute hypoxic respiratory failure. Aspiration pneumonia. Patient actually requiring 10 L of oxygen at the time of admission. Underwent CT of the chest which was unremarkable other than some patchy infiltrates. Possibility of an aspiration pneumonia cannot be ruled out prior to admission. Currently oxygenation improving to 7 L of oxygen. Echocardiogram also ordered. Monitor. Continue to wean  oxygen.  GERD. Continue PPI  Obesity Body mass index is 30.81 kg/m.  Placing the pt at higher risk of poor outcomes.  Mild hypernatremia. From IV fluids. Monitor.  Subjective:  Physical Exam: Vitals:   04/09/22 2126 04/09/22 2349 04/10/22 0658 04/10/22 1828  BP: 130/81 130/81  138/81  Pulse: 94 (!) 102  92  Resp:  15  17  Temp: 98.1 F (36.7 C) 97.6 F (36.4 C)  97.8 F (36.6 C)  TempSrc: Oral Oral  Oral  SpO2: 97% 97%  91%  Weight:   100.2 kg   Height:       General: Appear in mild distress; no visible Abnormal Neck Mass Or lumps, Conjunctiva normal Cardiovascular: S1 and S2 Present, no Murmur, Respiratory: good respiratory effort, Bilateral Air entry present and CTA, no Crackles, no wheezes Abdomen: Bowel Sound present, Non tender  Extremities: no Pedal edema Neurology: alert and oriented to time, place, and person  Gait not checked due to patient safety concerns   Data Reviewed: I have Reviewed nursing notes, Vitals, and Lab results since pt's last encounter. Pertinent lab results CBC and BMP I have ordered test including CBC and    Family Communication no one at bedside  Disposition: Status is: Inpatient Remains inpatient appropriate because: Need to wean oxygen.  Author: Lynden Oxford, MD 04/10/2022 7:13 PM  Please look on [email or web address] to find out who is on call.

## 2022-04-10 NOTE — Progress Notes (Signed)
Patient is from [facility]. CSW left voicemail for patients mother Cody [name]. CSW awaiting callback. CSW will continue to follow and assist with patients dc planning needs.
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
## 2022-04-10 NOTE — Plan of Care (Signed)

## 2022-04-10 NOTE — Inpatient Diabetes Management (Signed)
Inpatient Diabetes Program Recommendations  AACE/ADA: New Consensus Statement on Inpatient Glycemic Control (2015)  Target Ranges:  Prepandial:   less than 140 mg/dL      Peak postprandial:   less than 180 mg/dL (1-2 hours)      Critically ill patients:  140 - 180 mg/dL   Lab Results  Component Value Date   GLUCAP 362 (H) 04/10/2022   HGBA1C 11.7 (H) 04/09/2022    Latest Reference Range & Units 04/10/22 04:18 04/10/22 13:12  Glucose-Capillary 70 - 99 mg/dL 168 (H) 362 (H)   Diabetes history: New Onset Current orders for Inpatient glycemic control: Semglee 30 units qd, Novolog 0-15 q 4 hrs.  Inpatient Diabetes Program Recommendations:  Please consider: -Add Novolog 5 units tid meal coverage if eats 50% meals -Decrease Novolog correction to 0-15 tid, 0-5 hs since patient is eating  Ordered Living Well With Diabetes and insulin pen starter kit. Attempted to teach patient insulin administration, but patient had difficulty with constant direction to give insulin pen injection practice. Patient states his mom will be the one giving his injections if he is discharged home. Nurses, please continue to review insulin injection teaching with patient to evaluate if patient  is capable of self insulin administration and teach patient's mom when comes to visit.  Thank you, Nani Gasser. Alcie Runions, RN, MSN, CDE  Diabetes Coordinator Inpatient Glycemic Control Team Team Pager [phone] (8am-5pm) 04/10/2022 2:45 PM

## 2022-04-10 NOTE — Progress Notes (Signed)
  Echocardiogram 2D Echocardiogram has been performed.  Milda Smart 04/10/2022, 8:58 AM

## 2022-04-10 NOTE — Hospital Course (Signed)
PMH of GERD, allergic rhinitis presents with confusion found to have AKI with type II DM newly diagnosed with DKA.  Treated with IV fluids.  There was initially concern for code stroke although work-up was completely unremarkable.  Mentation improving.  Oxygenation is severely low suspected from aspiration pneumonia.

## 2022-04-11 DIAGNOSIS — E109 Type 1 diabetes mellitus without complications: Secondary | ICD-10-CM

## 2022-04-11 DIAGNOSIS — N179 Acute kidney failure, unspecified: Secondary | ICD-10-CM | POA: Diagnosis not present

## 2022-04-11 DIAGNOSIS — E669 Obesity, unspecified: Secondary | ICD-10-CM

## 2022-04-11 DIAGNOSIS — J690 Pneumonitis due to inhalation of food and vomit: Secondary | ICD-10-CM

## 2022-04-11 DIAGNOSIS — E1010 Type 1 diabetes mellitus with ketoacidosis without coma: Secondary | ICD-10-CM

## 2022-04-11 DIAGNOSIS — G9341 Metabolic encephalopathy: Secondary | ICD-10-CM

## 2022-04-11 DIAGNOSIS — J9601 Acute respiratory failure with hypoxia: Secondary | ICD-10-CM

## 2022-04-11 DIAGNOSIS — E876 Hypokalemia: Secondary | ICD-10-CM

## 2022-04-11 DIAGNOSIS — E870 Hyperosmolality and hypernatremia: Secondary | ICD-10-CM

## 2022-04-11 DIAGNOSIS — J309 Allergic rhinitis, unspecified: Secondary | ICD-10-CM

## 2022-04-11 DIAGNOSIS — A419 Sepsis, unspecified organism: Secondary | ICD-10-CM

## 2022-04-11 DIAGNOSIS — R6520 Severe sepsis without septic shock: Secondary | ICD-10-CM

## 2022-04-11 LAB — CBC
HCT: 39.8 % (ref 39.0–52.0)
Hemoglobin: 13.9 g/dL (ref 13.0–17.0)
MCH: 34.2 pg — ABNORMAL HIGH (ref 26.0–34.0)
MCHC: 34.9 g/dL (ref 30.0–36.0)
MCV: 97.8 fL (ref 80.0–100.0)
Platelets: 146 10*3/uL — ABNORMAL LOW (ref 150–400)
RBC: 4.07 MIL/uL — ABNORMAL LOW (ref 4.22–5.81)
RDW: 12.3 % (ref 11.5–15.5)
WBC: 5.6 10*3/uL (ref 4.0–10.5)
nRBC: 0 % (ref 0.0–0.2)

## 2022-04-11 LAB — RENAL FUNCTION PANEL
Albumin: 3.1 g/dL — ABNORMAL LOW (ref 3.5–5.0)
Anion gap: 10 (ref 5–15)
BUN: 14 mg/dL (ref 8–23)
CO2: 26 mmol/L (ref 22–32)
Calcium: 9.4 mg/dL (ref 8.9–10.3)
Chloride: 109 mmol/L (ref 98–111)
Creatinine, Ser: 1.04 mg/dL (ref 0.61–1.24)
GFR, Estimated: >60 mL/min (ref >60)
Glucose, Bld: 201 mg/dL — ABNORMAL HIGH (ref 70–99)
Phosphorus: 3.8 mg/dL (ref 2.5–4.6)
Potassium: 3.4 mmol/L — ABNORMAL LOW (ref 3.5–5.1)
Sodium: 145 mmol/L (ref 135–145)

## 2022-04-11 LAB — MAGNESIUM: Magnesium: 2.2 mg/dL (ref 1.7–2.4)

## 2022-04-11 LAB — GLUCOSE, CAPILLARY
Glucose-Capillary: 317 mg/dL — ABNORMAL HIGH (ref 70–99)
Glucose-Capillary: 317 mg/dL — ABNORMAL HIGH (ref 70–99)
Glucose-Capillary: 241 mg/dL — ABNORMAL HIGH (ref 70–99)
Glucose-Capillary: 213 mg/dL — ABNORMAL HIGH (ref 70–99)

## 2022-04-11 LAB — PROCALCITONIN: Procalcitonin: 0.15 ng/mL

## 2022-04-11 MED ORDER — INSULIN ASPART 100 UNIT/ML IJ SOLN
5.0000 [IU] | Freq: Three times a day (TID) | INTRAMUSCULAR | Status: DC
  Administered 2022-04-11 – 2022-04-12 (×2): 5 [IU] via SUBCUTANEOUS

## 2022-04-11 MED ORDER — ATORVASTATIN CALCIUM 10 MG PO TABS
20.0000 mg | ORAL_TABLET | Freq: Every day | ORAL | Status: DC
  Administered 2022-04-11 – 2022-04-14 (×4): 20 mg via ORAL
  Filled 2022-04-11 (×6): qty 2

## 2022-04-11 MED ORDER — POTASSIUM CHLORIDE CRYS ER 20 MEQ PO TBCR
40.0000 meq | EXTENDED_RELEASE_TABLET | Freq: Once | ORAL | Status: AC
  Administered 2022-04-11: 40 meq via ORAL
  Filled 2022-04-11: qty 2

## 2022-04-11 MED ORDER — SODIUM CHLORIDE 0.9 % IV SOLN: INTRAVENOUS | Status: DC | PRN

## 2022-04-11 NOTE — Plan of Care (Signed)

## 2022-04-11 NOTE — Progress Notes (Signed)
PROGRESS NOTE  Cody [name]:[id number] DOB: [birth year]   PCP: Ladell Pier, MD  Patient is from: SNF  DOA: 04/09/2022 LOS: 2  Chief complaints Chief Complaint  Patient presents with   Code Stroke   Hyperglycemia     Brief Narrative / Interim history: 61 yo M with PMH of GERD, allergic rhinitis and tobacco use disorder presents with confusion found to have DKA with newly diagnosed diabetes, acute respiratory failure with hypoxia likely due to aspiration pneumonia/pneumonitis, AKI, encephalopathy and strokelike symptoms.  He was treated with IV insulin and IV fluid.  DKA resolved.  He was transitioned to subcu insulin.  C-peptide low suggesting type 1 diabetes.  CVA work-up negative.  EEG did not show seizure.  Encephalopathy and AKI resolved.  He is on IV Unasyn for possible aspiration pneumonia.  Respiratory failure improving.  Early weaning of oxygen.  Hopefully discharge to SNF in the next 24 to 48 hours if medically stable.  Subjective: Seen and examined earlier this morning.  No major events overnight of this morning.  No complaints.  He is oriented x4.  Objective: Vitals:   04/11/22 0435 04/11/22 0700 04/11/22 1200 04/11/22 1356  BP: 128/86 116/76 129/82 (!) 141/91  Pulse: 69 84 92 (!) 103  Resp: '14 18 15 17  ' Temp: 98.1 F (36.7 C) 98.7 F (37.1 C) 98.7 F (37.1 C) 98.4 F (36.9 C)  TempSrc: Axillary Oral Oral Oral  SpO2: 97% 99% 92% 95%  Weight: 102 kg     Height:        Examination:  GENERAL: No apparent distress.  Nontoxic. HEENT: MMM.  Vision and hearing grossly intact.  NECK: Supple.  No apparent JVD.  RESP:  No IWOB.  Fair aeration bilaterally. CVS:  RRR. Heart sounds normal.  ABD/GI/GU: BS+. Abd soft, NTND.  MSK/EXT:  Moves extremities. No apparent deformity. No edema.  SKIN: no apparent skin lesion or wound NEURO: Awake, alert and oriented x4.  No apparent focal neuro deficit. PSYCH: Calm. Normal affect.   Procedures:   None  Microbiology summarized: COVID-19 PCR nonreactive. Blood cultures NGTD  Assessment and plan: Principal Problem:   DKA (diabetic ketoacidosis) ([place]) Active Problems:   GERD (gastroesophageal reflux disease)   Acute metabolic encephalopathy   AKI (acute kidney injury) ([place])   Dehydration   SIRS (systemic inflammatory response syndrome) (HCC)   Allergic rhinitis  Newly diagnosed type 1 diabetes with diabetic ketoacidosis and hyperglycemia: A1c 11.7%.  C-peptide low at 0.6.  DKA resolved. Recent Labs  Lab 04/10/22 1312 04/10/22 1703 04/10/22 2157 04/11/22 0807 04/11/22 1208  GLUCAP 362* 418* 315* 317* 317*  -Continue Semglee 35 units at bedtime -Continue SSI-resistant -Add NovoLog 5 units 3 times daily with meals -Start statin. -Further adjustment as appropriate.   Acute kidney injury: In the setting of DKA.  Resolved. Recent Labs    04/09/22 0119 04/09/22 0121 04/09/22 0515 04/09/22 0938 04/09/22 1108 04/09/22 1658 04/10/22 0223 04/11/22 0713  BUN 37* 41* 32* 29* 30* 25* 21 14  CREATININE 2.98* 2.20* 2.36* 2.01* 1.86* 1.49* 1.18 1.04  -Monitor as needed   Acute metabolic encephalopathy/strokelike symptoms: CVA work-up unrevealing.  EEG unremarkable.  Encephalopathy resolved.  He is oriented x4. -Reorientation and delirium precautions   Acute hypoxic respiratory failure due to aspiration pneumonia: Required up to 10 L at some point.  CT chest with patchy infiltrate.  Likely in the setting of encephalopathy.  TTE with normal LVEF and G1 DD.  Respiratory failure improving. -Continue  IV Unasyn -Wean off oxygen as able -Incentive spirometry   GERD. -Continue PPI    Mild hypernatremia: Resolved. -Monitor  Hypokalemia: -Replace and recheck  Tobacco use disorder -Encourage cessation -Continue Cotton patch  Obesity Body mass index is 31.36 kg/m.         DVT prophylaxis:  SCDs Start: 04/09/22 1448  Code Status: Full code Family Communication:  None at bedside Level of care: Med-Surg.  Change level of care to MedSurg Status is: Inpatient Remains inpatient appropriate because: Newly diagnosed type 1 diabetes and aspiration pneumonia   Final disposition: SNF Consultants:  Neurology  Sch Meds:  Scheduled Meds:  atorvastatin  20 mg Oral Daily   insulin aspart  0-20 Units Subcutaneous TID WC   insulin aspart  0-5 Units Subcutaneous QHS   insulin aspart  5 Units Subcutaneous TID WC   insulin glargine-yfgn  35 Units Subcutaneous QHS   insulin starter kit- pen needles  1 kit Other Once   Continuous Infusions:  ampicillin-sulbactam (UNASYN) IV 3 g (04/11/22 1333)   PRN Meds:.acetaminophen **OR** acetaminophen, dextrose, nicotine, ondansetron (ZOFRAN) IV  Antimicrobials: Anti-infectives (From admission, onward)    Start     Dose/Rate Route Frequency Ordered Stop   04/09/22 1100  Ampicillin-Sulbactam (UNASYN) 3 g in sodium chloride 0.9 % 100 mL IVPB        3 g 200 mL/hr over 30 Minutes Intravenous Every 8 hours 04/09/22 1045          I have personally reviewed the following labs and images: CBC: Recent Labs  Lab 04/09/22 0119 04/09/22 0121 04/09/22 0141 04/09/22 0406 04/09/22 1031 04/11/22 0713  WBC 12.8*  --   --  12.5*  --  5.6  NEUTROABS 10.0*  --   --  9.8*  --   --   HGB 18.2* 18.4* 18.7* 16.9 16.0 13.9  HCT 54.2* 54.0* 55.0* 48.8 47.0 39.8  MCV 101.3*  --   --  98.8  --  97.8  PLT 249  --   --  250  --  146*   BMP &GFR Recent Labs  Lab 04/09/22 0515 04/09/22 0938 04/09/22 1031 04/09/22 1108 04/09/22 1658 04/10/22 0223 04/11/22 0713  NA 139 146* 146* 144 147* 148* 145  K 4.7 4.4 4.4 4.5 3.9 3.8 3.4*  CL 112* 115*  --  114* 116* 117* 109  CO2 11* 16*  --  16* '22 22 26  ' GLUCOSE 429* 263*  --  297* 217* 141* 201*  BUN 32* 29*  --  30* 25* 21 14  CREATININE 2.36* 2.01*  --  1.86* 1.49* 1.18 1.04  CALCIUM 9.1 9.8  --  9.5 9.7 9.4 9.4  MG 2.9*  --   --   --   --   --  2.2  PHOS 3.2  --   --   --    --   --  3.8   Estimated Creatinine Clearance: 89.6 mL/min (by C-G formula based on SCr of 1.04 mg/dL). Liver & Pancreas: Recent Labs  Lab 04/09/22 0119 04/09/22 0515 04/11/22 0713  AST 18 16  --   ALT 25 21  --   ALKPHOS 91 76  --   BILITOT 2.2* 2.0*  --   PROT 9.2* 7.7  --   ALBUMIN 4.8 4.0 3.1*   No results for input(s): "LIPASE", "AMYLASE" in the last 168 hours. Recent Labs  Lab 04/09/22 0515  AMMONIA 32   Diabetic: Recent Labs    04/09/22 0406  HGBA1C 11.7*   Recent Labs  Lab 04/10/22 1312 04/10/22 1703 04/10/22 2157 04/11/22 0807 04/11/22 1208  GLUCAP 362* 418* 315* 317* 317*   Cardiac Enzymes: No results for input(s): "CKTOTAL", "CKMB", "CKMBINDEX", "TROPONINI" in the last 168 hours. No results for input(s): "PROBNP" in the last 8760 hours. Coagulation Profile: Recent Labs  Lab 04/09/22 0119  INR 1.2   Thyroid Function Tests: Recent Labs    04/09/22 0406 04/09/22 1108  TSH 0.205*  --   FREET4  --  0.76   Lipid Profile: No results for input(s): "CHOL", "HDL", "LDLCALC", "TRIG", "CHOLHDL", "LDLDIRECT" in the last 72 hours. Anemia Panel: No results for input(s): "VITAMINB12", "FOLATE", "FERRITIN", "TIBC", "IRON", "RETICCTPCT" in the last 72 hours. Urine analysis:    Component Value Date/Time   COLORURINE YELLOW 04/09/2022 0250   APPEARANCEUR CLEAR 04/09/2022 0250   LABSPEC 1.027 04/09/2022 0250   PHURINE 5.0 04/09/2022 0250   GLUCOSEU >=500 (A) 04/09/2022 0250   HGBUR SMALL (A) 04/09/2022 0250   BILIRUBINUR NEGATIVE 04/09/2022 0250   KETONESUR 80 (A) 04/09/2022 0250   PROTEINUR 100 (A) 04/09/2022 0250   UROBILINOGEN 0.2 12/08/2009 2250   NITRITE NEGATIVE 04/09/2022 0250   LEUKOCYTESUR NEGATIVE 04/09/2022 0250   Sepsis Labs: Invalid input(s): "PROCALCITONIN", "LACTICIDVEN"  Microbiology: Recent Results (from the past 240 hour(s))  Resp Panel by RT-PCR (Flu A&B, Covid) Anterior Nasal Swab     Status: None   Collection Time: 04/09/22   2:53 AM   Specimen: Anterior Nasal Swab  Result Value Ref Range Status   SARS Coronavirus 2 by RT PCR NEGATIVE NEGATIVE Final    Comment: (NOTE) SARS-CoV-2 target nucleic acids are NOT DETECTED.  The SARS-CoV-2 RNA is generally detectable in upper respiratory specimens during the acute phase of infection. The lowest concentration of SARS-CoV-2 viral copies this assay can detect is 138 copies/mL. A negative result does not preclude SARS-Cov-2 infection and should not be used as the sole basis for treatment or other patient management decisions. A negative result may occur with  improper specimen collection/handling, submission of specimen other than nasopharyngeal swab, presence of viral mutation(s) within the areas targeted by this assay, and inadequate number of viral copies(<138 copies/mL). A negative result must be combined with clinical observations, patient history, and epidemiological information. The expected result is Negative.  Fact Sheet for Patients:  EntrepreneurPulse.com.au  Fact Sheet for Healthcare Providers:  IncredibleEmployment.be  This test is no t yet approved or cleared by the [place] FDA and  has been authorized for detection and/or diagnosis of SARS-CoV-2 by FDA under an Emergency Use Authorization (EUA). This EUA will remain  in effect (meaning this test can be used) for the duration of the COVID-19 declaration under Section 564(b)(1) of the Act, 21 U.S.C.section 360bbb-3(b)(1), unless the authorization is terminated  or revoked sooner.       Influenza A by PCR NEGATIVE NEGATIVE Final   Influenza B by PCR NEGATIVE NEGATIVE Final    Comment: (NOTE) The Xpert Xpress SARS-CoV-2/FLU/RSV plus assay is intended as an aid in the diagnosis of influenza from Nasopharyngeal swab specimens and should not be used as a sole basis for treatment. Nasal washings and aspirates are unacceptable for Xpert Xpress  SARS-CoV-2/FLU/RSV testing.  Fact Sheet for Patients: EntrepreneurPulse.com.au  Fact Sheet for Healthcare Providers: IncredibleEmployment.be  This test is not yet approved or cleared by the [place] FDA and has been authorized for detection and/or diagnosis of SARS-CoV-2 by FDA under an Emergency Use Authorization (EUA). This EUA  will remain in effect (meaning this test can be used) for the duration of the COVID-19 declaration under Section 564(b)(1) of the Act, 21 U.S.C. section 360bbb-3(b)(1), unless the authorization is terminated or revoked.  Performed at [facility], [place] [address], [place], [place] [address]   Culture, blood (Routine X 2) w Reflex to ID Panel     Status: None (Preliminary result)   Collection Time: 04/09/22 10:29 AM   Specimen: BLOOD  Result Value Ref Range Status   Specimen Description BLOOD SITE NOT SPECIFIED  Final   Special Requests   Final    BOTTLES DRAWN AEROBIC AND ANAEROBIC Blood Culture adequate volume   Culture   Final    NO GROWTH 2 DAYS Performed at [facility], [address], [place], [place] [address]    Report Status PENDING  Incomplete  Culture, blood (Routine X 2) w Reflex to ID Panel     Status: None (Preliminary result)   Collection Time: 04/09/22 10:38 PM   Specimen: BLOOD RIGHT HAND  Result Value Ref Range Status   Specimen Description BLOOD RIGHT HAND  Final   Special Requests   Final    BOTTLES DRAWN AEROBIC AND ANAEROBIC Blood Culture adequate volume   Culture   Final    NO GROWTH 2 DAYS Performed at [facility], [place] [address] [place], [place] [address]    Report Status PENDING  Incomplete    Radiology Studies: No results found.    Breyden Jeudy T. [place]  If 7PM-7AM, please contact night-coverage [email or web address] 04/11/2022, 2:15 PM

## 2022-04-11 NOTE — Progress Notes (Signed)
Attempted to wean off oxygen. Only able to wean her down to 2 LPMat 91-94 %. At 1 Liter pt  quickly desaturates to 88 -89 %.

## 2022-04-11 NOTE — Evaluation (Signed)
Physical Therapy Evaluation Patient Details Name: Cody [name] MRN: [id number] DOB: 1961/06/30 Today's Date: 04/11/2022  History of Present Illness  Patient is a 61 year old male with DKA, new onset of diabetes mellitus. Initial concern for stroke although work-up unremarkable. Acute metabolic encephalopathy with improving mentation. Acute hypoxic respiratory failure with aspiration pneumonia.   Clinical Impression  Patient is agreeable to PT evaluation. The patient is from [facility] per notes and patient reports he wants to return home to live with his mother. He states he was walking with staff using a rolling walker prior to admission.  Today, the patient is requiring physical assistance with mobility. Transfer training initiated. Patient unable to stand with several attempts but was able to finally stand with maximal assistance and cues for technique. He was able to take several steps along the edge of bed with rolling walker and assistance, however activity tolerance limited for progression of further ambulation. The patient will need to return to the facility with PT follow up recommended if possible. PT will continue to follow while in the hospital to maximize independence and decrease caregiver burden.    Recommendations for follow up therapy are one component of a multi-disciplinary discharge planning process, led by the attending physician.  Recommendations may be updated based on patient status, additional functional criteria and insurance authorization.  Follow Up Recommendations Skilled nursing-short term rehab (<3 hours/day) Can patient physically be transported by private vehicle: No    Assistance Recommended at Discharge Frequent or constant Supervision/Assistance  Patient can return home with the following  A lot of help with walking and/or transfers;A lot of help with bathing/dressing/bathroom;Help with stairs or ramp for entrance;Assist for transportation;Assistance with  cooking/housework    Equipment Recommendations None recommended by PT  Recommendations for Other Services       Functional Status Assessment Patient has had a recent decline in their functional status and demonstrates the ability to make significant improvements in function in a reasonable and predictable amount of time.     Precautions / Restrictions Precautions Precautions: Fall Restrictions Weight Bearing Restrictions: No      Mobility  Bed Mobility Overal bed mobility: Needs Assistance Bed Mobility: Supine to Sit, Sit to Supine     Supine to sit: Mod assist Sit to supine: Mod assist   General bed mobility comments: assistance for trunk and BLE support. verbal cues for technique. increased time and effort required    Transfers Overall transfer level: Needs assistance Equipment used: Rolling walker (2 wheels) Transfers: Sit to/from Stand Sit to Stand: Max assist           General transfer comment: significant lifting assistance required to stand. patient had multiple failed standing attempts and then was able to stand with maximal assistance and cues for technique    Ambulation/Gait Ambulation/Gait assistance: Min assist Gait Distance (Feet): 2 Feet Assistive device: Rolling walker (2 wheels)   Gait velocity: decreased     General Gait Details: able to take several steps along edge of bed with steadying assistance. standing activity tolerance limited for progression of further ambulation. would recommend chair follow for safety for future ambulation attempts  Stairs            Wheelchair Mobility    Modified Rankin (Stroke Patients Only)       Balance Overall balance assessment: Needs assistance Sitting-balance support: No upper extremity supported, Feet supported Sitting balance-Leahy Scale: Fair     Standing balance support: Bilateral upper extremity supported, Reliant  on assistive device for balance Standing balance-Leahy Scale:  Poor Standing balance comment: external support required to maintain standing balance                             Pertinent Vitals/Pain Pain Assessment Pain Assessment: No/denies pain    Home Living Family/patient expects to be discharged to:: Skilled nursing facility                   Additional Comments: from [facility] per notes    Prior Function Prior Level of Function : Needs assist;Patient poor historian/Family not available             Mobility Comments: patient reports he walked 191ft with a rolling walker with physical therapy at facility       Hand Dominance        Extremity/Trunk Assessment   Upper Extremity Assessment Upper Extremity Assessment: Generalized weakness    Lower Extremity Assessment Lower Extremity Assessment: Generalized weakness       Communication   Communication: No difficulties  Cognition Arousal/Alertness: Awake/alert Behavior During Therapy: WFL for tasks assessed/performed Overall Cognitive Status: No family/caregiver present to determine baseline cognitive functioning                                 General Comments: increased time for processing required. patient is oriented to person, place, situation, and looks to the calendar when asked about date.        General Comments General comments (skin integrity, edema, etc.): Sp02 in the 90's with activity on 3 L02    Exercises     Assessment/Plan    PT Assessment Patient needs continued PT services  PT Problem List Decreased strength;Decreased activity tolerance;Decreased range of motion;Decreased balance;Decreased mobility;Decreased safety awareness       PT Treatment Interventions Gait training;DME instruction;Functional mobility training;Therapeutic activities;Therapeutic exercise;Balance training;Neuromuscular re-education;Patient/family education;Cognitive remediation    PT Goals (Current goals can be found in the Care Plan  section)  Acute Rehab PT Goals Patient Stated Goal: to go home PT Goal Formulation: With patient Time For Goal Achievement: 04/18/22 Potential to Achieve Goals: Fair    Frequency Min 3X/week     Co-evaluation               AM-PAC PT "6 Clicks" Mobility  Outcome Measure Help needed turning from your back to your side while in a flat bed without using bedrails?: A Lot Help needed moving from lying on your back to sitting on the side of a flat bed without using bedrails?: A Lot Help needed moving to and from a bed to a chair (including a wheelchair)?: A Lot Help needed standing up from a chair using your arms (e.g., wheelchair or bedside chair)?: A Lot Help needed to walk in hospital room?: A Lot Help needed climbing 3-5 steps with a railing? : Total 6 Click Score: 11    End of Session Equipment Utilized During Treatment: Gait belt Activity Tolerance: Patient limited by fatigue;Patient tolerated treatment well Patient left: in bed;with call bell/phone within reach;with bed alarm set;with SCD's reapplied Nurse Communication: Mobility status PT Visit Diagnosis: Unsteadiness on feet (R26.81);Muscle weakness (generalized) (M62.81)    Time: 6378-5885 PT Time Calculation (min) (ACUTE ONLY): 23 min   Charges:   PT Evaluation $PT Eval Low Complexity: 1 Low PT Treatments $Therapeutic Activity: 8-22 mins  Donna Bernard, PT, MPT   Ina Homes 04/11/2022, 12:56 PM

## 2022-04-11 NOTE — TOC Initial Note (Addendum)
Transition of Care [facility]) - Initial/Assessment Note    Patient Details  Name: Cody [name] MRN: [id number] Date of Birth: 1960-10-27  Transition of Care [facility]) CM/SW Contact:    Delilah Shan, LCSWA Phone Number: 04/11/2022, 1:05 PM  Clinical Narrative:                  Update- PT recommended short term rehab when patient returns. CSW spoke with patients mother Kathie Rhodes regarding PT recommendation. Kathie Rhodes in agreement with short term rehab for patient. CSW informed facility of PT recommendation. Meeka with [facility] confirmed patient can receive short term rehab when he returns. CSW will continue to follow.  CSW spoke with patients mother Kathie Rhodes who confirmed plan is for patient to return to [facility] when medically ready for dc. All questions answered. No further questions reported at this time. CSW will continue to follow and assist with patients dc planning needs.  Expected Discharge Plan: Skilled Nursing Facility Barriers to Discharge: Continued Medical Work up   Patient Goals and CMS Choice   CMS Medicare.gov Compare Post Acute Care list provided to:: Patient Represenative (must comment) (Patients mother Kathie Rhodes) Choice offered to / list presented to : Parent (Patients mother Kathie Rhodes)  Expected Discharge Plan and Services Expected Discharge Plan: Skilled Nursing Facility In-house Referral: Clinical Social Work     Living arrangements for the past 2 months: Skilled Nursing Facility                                      Prior Living Arrangements/Services Living arrangements for the past 2 months: Skilled Nursing Facility Lives with:: Facility Resident Patient language and need for interpreter reviewed:: Yes Do you feel safe going back to the place where you live?: Yes      Need for Family Participation in Patient Care: Yes (Comment) Care giver support system in place?: Yes (comment)   Criminal Activity/Legal Involvement Pertinent to Current Situation/Hospitalization:  No - Comment as needed  Activities of Daily Living      Permission Sought/Granted Permission sought to share information with : Case Manager, Family Supports, Magazine features editor Permission granted to share information with : No  Share Information with NAME: Due to patients current Orientation spoke with patients mother Kathie Rhodes  Permission granted to share info w AGENCY: Due to patients current Orientation spoke with patients mother Betty/SNF  Permission granted to share info w Relationship: Due to patients current Orientation spoke with patients mother Kathie Rhodes  Permission granted to share info w Contact Information: Due to patients current Orientation spoke with patients mother Kathie Rhodes ,[phone]  Emotional Assessment       Orientation: : Oriented to Self, Oriented to Place, Oriented to  Time, Oriented to Situation (WDL) Alcohol / Substance Use: Not Applicable Psych Involvement: No (comment)  Admission diagnosis:  Dehydration [E86.0] DKA (diabetic ketoacidosis) (HCC) [E11.10] AKI (acute kidney injury) (HCC) [N17.9] Diabetic ketoacidosis without coma associated with other specified diabetes mellitus (HCC) [E13.10] Patient Active Problem List   Diagnosis Date Noted   DKA (diabetic ketoacidosis) (HCC) 04/09/2022   AKI (acute kidney injury) (HCC) 04/09/2022   Dehydration 04/09/2022   SIRS (systemic inflammatory response syndrome) (HCC) 04/09/2022   Allergic rhinitis 04/09/2022   Cocaine abuse (HCC) 01/22/2018   Acute metabolic encephalopathy 01/18/2018   Community acquired pneumonia 01/18/2018   Bilateral pulmonary infiltrates on chest x-ray 01/18/2018   Suspected carbon monoxide poisoning 01/18/2018   Macrocytic  anemia 01/18/2018   Severe sepsis (HCC) 01/18/2018   Renal insufficiency 11/15/2017   Post-traumatic osteoarthritis of both hips 11/15/2017   Chronic midline low back pain without sciatica 11/15/2017   Weight loss, unintentional 10/07/2017   Immunization due  06/06/2017   Tobacco use 06/06/2017   Posterior tibial tendinitis, left leg 04/25/2017   Acute seasonal allergic rhinitis due to pollen 11/22/2016   Poor dentition 11/22/2016   Cervical radicular pain 09/27/2016   Uncomplicated alcohol dependence (HCC) 09/27/2016   Tibia/fibula fracture, left, open type III, with routine healing, subsequent encounter 09/10/2016   S/P ORIF (open reduction internal fixation) fracture 10/19/2015   Chronic pain of right knee 12/03/2014   GERD (gastroesophageal reflux disease) 12/03/2014   Smoking 12/03/2014   PCP:  Marcine Matar, MD Pharmacy:   [facility] [address], [place] - [address] [place] [address] Phone: [phone] Fax: [phone]  [facility] [address] [place] [address] Phone: [phone] Fax: [phone]     Social Determinants of Health (SDOH) Interventions    Readmission Risk Interventions     No data to display

## 2022-04-11 NOTE — NC FL2 (Addendum)
[place] LEVEL OF CARE SCREENING TOOL     IDENTIFICATION  Patient Name: Cody [name] Birthdate: Sep 20, 1960 Sex: male Admission Date (Current Location): 04/09/2022  [facility] [place] Number:  Herbalist and Address:  [facility] [place], [place] [address], [place], [place] [address]      Provider Number: [id number]  Attending Physician Name and Address:  Mercy Riding, MD  Relative Name and Phone Number:  Inez Catalina (Mother) [phone]    Current Level of Care: Hospital Recommended Level of Care: [place] Prior Approval Number:    Date Approved/Denied:   PASRR Number: [id number]  Discharge Plan: SNF    Current Diagnoses: Patient Active Problem List   Diagnosis Date Noted   DKA (diabetic ketoacidosis) ([place]) 04/09/2022   AKI (acute kidney injury) ([place]) 04/09/2022   Dehydration 04/09/2022   SIRS (systemic inflammatory response syndrome) ([place]) 04/09/2022   Allergic rhinitis 04/09/2022   Cocaine abuse ([place]) 25/63/8937   Acute metabolic encephalopathy 34/28/7681   Community acquired pneumonia 01/18/2018   Bilateral pulmonary infiltrates on chest x-ray 01/18/2018   Suspected carbon monoxide poisoning 01/18/2018   Macrocytic anemia 01/18/2018   Severe sepsis (HCC) 01/18/2018   Renal insufficiency 11/15/2017   Post-traumatic osteoarthritis of both hips 11/15/2017   Chronic midline low back pain without sciatica 11/15/2017   Weight loss, unintentional 10/07/2017   Immunization due 06/06/2017   Tobacco use 06/06/2017   Posterior tibial tendinitis, left leg 04/25/2017   Acute seasonal allergic rhinitis due to pollen 11/22/2016   Poor dentition 11/22/2016   Cervical radicular pain 15/72/6203   Uncomplicated alcohol dependence ([place]) 09/27/2016   Tibia/fibula fracture, left, open type III, with routine healing, subsequent encounter 09/10/2016   S/P ORIF (open reduction internal fixation) fracture 10/19/2015   Chronic  pain of right knee 12/03/2014   GERD (gastroesophageal reflux disease) 12/03/2014   Smoking 12/03/2014    Orientation RESPIRATION BLADDER Height & Weight     Self, Time, Situation, Place (WDL)  O2 (Nasal Cannula 5 liters) Incontinent, External catheter (External Urinary Catheter) Weight: 224 lb 13.9 oz (102 kg) Height:  _0  (180.3 cm)  BEHAVIORAL SYMPTOMS/MOOD NEUROLOGICAL BOWEL NUTRITION STATUS      Continent (WDL) Diet (Please see discharge summary)  AMBULATORY STATUS COMMUNICATION OF NEEDS Skin   Min assist  Verbally Other (Comment) (Appropriate for ethnicity,dry,redness,Erythema,Groin,Bilateral,non-tenting)                       Personal Care Assistance Level of Assistance                                  PT                               OT    5x min weekly  5x min weekly        Functional Limitations Info   (WDL)          SPECIAL CARE FACTORS FREQUENCY                       Contractures Contractures Info: Not present    Additional Factors Info  Code Status, Allergies, Insulin Sliding Scale Code Status Info: FULL Allergies Info: No Known Allergies   Insulin Sliding Scale Info: insulin aspart (novoLOG) injection 0-20 Units 3 times daily  with meals,insulin aspart (novoLOG) injection 0-5 Units daily at bedtime,insulin aspart (novoLOG) injection 5 Units 3 times daily with meals,insulin glargine-yfgn (SEMGLEE) injection 35 Units daily at bedtime,insulin starter kit- pen needles (English) 1 kit once       Current Medications (04/11/2022):  This is the current hospital active medication list Current Facility-Administered Medications  Medication Dose Route Frequency Provider Last Rate Last Admin   acetaminophen (TYLENOL) tablet 650 mg  650 mg Oral Q6H PRN Howerter, Justin B, DO       Or   acetaminophen (TYLENOL) suppository 650 mg  650 mg Rectal Q6H PRN Howerter, Justin B, DO       Ampicillin-Sulbactam (UNASYN) 3 g in sodium chloride 0.9 % 100 mL IVPB  3  g Intravenous Q8H von Dohlen, Haley B, RPH 200 mL/hr at 04/11/22 0609 3 g at 04/11/22 0609   dextrose 50 % solution 0-50 mL  0-50 mL Intravenous PRN Lavina Hamman, MD       insulin aspart (novoLOG) injection 0-20 Units  0-20 Units Subcutaneous TID WC Lavina Hamman, MD   15 Units at 04/11/22 0834   insulin aspart (novoLOG) injection 0-5 Units  0-5 Units Subcutaneous QHS Lavina Hamman, MD   4 Units at 04/10/22 2235   insulin aspart (novoLOG) injection 5 Units  5 Units Subcutaneous TID WC Gonfa, Charlesetta Ivory, MD       insulin glargine-yfgn (SEMGLEE) injection 35 Units  35 Units Subcutaneous QHS Lavina Hamman, MD   35 Units at 04/10/22 2240   insulin starter kit- pen needles (English) 1 kit  1 kit Other Once Lavina Hamman, MD       nicotine (NICODERM CQ - dosed in mg/24 hours) patch 14 mg  14 mg Transdermal Daily PRN Howerter, Justin B, DO       ondansetron (ZOFRAN) injection 4 mg  4 mg Intravenous Q6H PRN Howerter, Justin B, DO       potassium chloride SA (KLOR-CON M) CR tablet 40 mEq  40 mEq Oral Once Mercy Riding, MD         Discharge Medications: Please see discharge summary for a list of discharge medications.  Relevant Imaging Results:  Relevant Lab Results:   Additional Information [id number]  Milas Gain, LCSWA

## 2022-04-12 ENCOUNTER — Inpatient Hospital Stay (HOSPITAL_COMMUNITY): Payer: Medicaid Other

## 2022-04-12 DIAGNOSIS — E1010 Type 1 diabetes mellitus with ketoacidosis without coma: Secondary | ICD-10-CM | POA: Diagnosis not present

## 2022-04-12 DIAGNOSIS — N179 Acute kidney failure, unspecified: Secondary | ICD-10-CM | POA: Diagnosis not present

## 2022-04-12 DIAGNOSIS — E109 Type 1 diabetes mellitus without complications: Secondary | ICD-10-CM | POA: Diagnosis not present

## 2022-04-12 DIAGNOSIS — R531 Weakness: Secondary | ICD-10-CM

## 2022-04-12 DIAGNOSIS — G9341 Metabolic encephalopathy: Secondary | ICD-10-CM | POA: Diagnosis not present

## 2022-04-12 LAB — CBC
HCT: 42.8 % (ref 39.0–52.0)
Hemoglobin: 15.5 g/dL (ref 13.0–17.0)
MCH: 34.5 pg — ABNORMAL HIGH (ref 26.0–34.0)
MCHC: 36.2 g/dL — ABNORMAL HIGH (ref 30.0–36.0)
MCV: 95.3 fL (ref 80.0–100.0)
Platelets: 174 10*3/uL (ref 150–400)
RBC: 4.49 MIL/uL (ref 4.22–5.81)
RDW: 12 % (ref 11.5–15.5)
WBC: 9 10*3/uL (ref 4.0–10.5)
nRBC: 0 % (ref 0.0–0.2)

## 2022-04-12 LAB — RENAL FUNCTION PANEL
Albumin: 3.3 g/dL — ABNORMAL LOW (ref 3.5–5.0)
Anion gap: 9 (ref 5–15)
BUN: 15 mg/dL (ref 8–23)
CO2: 25 mmol/L (ref 22–32)
Calcium: 10.1 mg/dL (ref 8.9–10.3)
Chloride: 108 mmol/L (ref 98–111)
Creatinine, Ser: 0.97 mg/dL (ref 0.61–1.24)
GFR, Estimated: >60 mL/min (ref >60)
Glucose, Bld: 236 mg/dL — ABNORMAL HIGH (ref 70–99)
Phosphorus: 4.7 mg/dL — ABNORMAL HIGH (ref 2.5–4.6)
Potassium: 3.1 mmol/L — ABNORMAL LOW (ref 3.5–5.1)
Sodium: 142 mmol/L (ref 135–145)

## 2022-04-12 LAB — LIPID PANEL
Cholesterol: 235 mg/dL — ABNORMAL HIGH (ref 0–200)
HDL: 42 mg/dL (ref >40)
LDL Cholesterol: 151 mg/dL — ABNORMAL HIGH (ref 0–99)
Total CHOL/HDL Ratio: 5.6 RATIO
Triglycerides: 208 mg/dL — ABNORMAL HIGH (ref <150)
VLDL: 42 mg/dL — ABNORMAL HIGH (ref 0–40)

## 2022-04-12 LAB — GLUCOSE, CAPILLARY
Glucose-Capillary: 246 mg/dL — ABNORMAL HIGH (ref 70–99)
Glucose-Capillary: 182 mg/dL — ABNORMAL HIGH (ref 70–99)
Glucose-Capillary: 182 mg/dL — ABNORMAL HIGH (ref 70–99)
Glucose-Capillary: 177 mg/dL — ABNORMAL HIGH (ref 70–99)

## 2022-04-12 LAB — MAGNESIUM: Magnesium: 2.2 mg/dL (ref 1.7–2.4)

## 2022-04-12 MED ORDER — POTASSIUM CHLORIDE CRYS ER 20 MEQ PO TBCR
40.0000 meq | EXTENDED_RELEASE_TABLET | ORAL | Status: AC
  Administered 2022-04-12 (×2): 40 meq via ORAL
  Filled 2022-04-12 (×2): qty 2

## 2022-04-12 MED ORDER — INSULIN ASPART 100 UNIT/ML IJ SOLN
8.0000 [IU] | Freq: Three times a day (TID) | INTRAMUSCULAR | Status: DC
  Administered 2022-04-12 (×2): 8 [IU] via SUBCUTANEOUS

## 2022-04-12 MED ORDER — INSULIN GLARGINE-YFGN 100 UNIT/ML ~~LOC~~ SOLN
20.0000 [IU] | Freq: Two times a day (BID) | SUBCUTANEOUS | Status: DC
  Administered 2022-04-12 – 2022-04-14 (×5): 20 [IU] via SUBCUTANEOUS
  Filled 2022-04-12 (×6): qty 0.2

## 2022-04-12 MED ORDER — POLYETHYLENE GLYCOL 3350 17 G PO PACK: 17.0000 g | PACK | Freq: Two times a day (BID) | ORAL | Status: DC | PRN

## 2022-04-12 MED ORDER — INSULIN ASPART 100 UNIT/ML IJ SOLN
0.0000 [IU] | INTRAMUSCULAR | Status: DC
  Administered 2022-04-13 (×3): 2 [IU] via SUBCUTANEOUS
  Administered 2022-04-13: 3 [IU] via SUBCUTANEOUS
  Administered 2022-04-13: 2 [IU] via SUBCUTANEOUS
  Administered 2022-04-13: 3 [IU] via SUBCUTANEOUS
  Administered 2022-04-14 (×5): 2 [IU] via SUBCUTANEOUS

## 2022-04-12 MED ORDER — SIMETHICONE 80 MG PO CHEW
80.0000 mg | CHEWABLE_TABLET | Freq: Four times a day (QID) | ORAL | Status: DC
  Administered 2022-04-12 – 2022-04-14 (×9): 80 mg via ORAL
  Filled 2022-04-12 (×10): qty 1

## 2022-04-12 MED ORDER — SODIUM CHLORIDE 0.9 % IV SOLN: INTRAVENOUS | Status: AC

## 2022-04-12 MED ORDER — SENNOSIDES-DOCUSATE SODIUM 8.6-50 MG PO TABS: 1.0000 | ORAL_TABLET | Freq: Two times a day (BID) | ORAL | Status: DC | PRN

## 2022-04-12 NOTE — Inpatient Diabetes Management (Signed)
Inpatient Diabetes Program Recommendations  AACE/ADA: New Consensus Statement on Inpatient Glycemic Control (2015)  Target Ranges:  Prepandial:   less than 140 mg/dL      Peak postprandial:   less than 180 mg/dL (1-2 hours)      Critically ill patients:  140 - 180 mg/dL   Lab Results  Component Value Date   GLUCAP 246 (H) 04/12/2022   HGBA1C 11.7 (H) 04/09/2022    Review of Glycemic Control  Latest Reference Range & Units 04/11/22 08:07 04/11/22 12:08 04/11/22 16:47 04/11/22 22:10 04/12/22 08:00  Glucose-Capillary 70 - 99 mg/dL 276 (H) 701 (H) 100 (H) 213 (H) 246 (H)   Diabetes history: New Diagnosis Diabetes this admission  Current orders for Inpatient glycemic control:  Semglee 35 units Novolog 0-20 units tid + hs  Novolog 5 units tid meal coverage  Returning to ALF A1c 11.7%  Inpatient Diabetes Program Recommendations:    -  Increase Semglee to 40 units  Thanks,  Christena Deem RN, MSN, BC-ADM Inpatient Diabetes Coordinator Team Pager [phone] (8a-5p)

## 2022-04-12 NOTE — TOC Progression Note (Signed)
Transition of Care [facility]) - Progression Note    Patient Details  Name: Cody [name] MRN: [id number] Date of Birth: 1961-05-31  Transition of [facility]) CM/SW Contact  Delilah Shan, LCSWA Phone Number: 04/12/2022, 11:36 AM  Clinical Narrative:     Plan to return to [facility] when medically ready for dc. CSW will continue to follow and assist with patients dc planning needs.  Expected Discharge Plan: Skilled Nursing Facility Barriers to Discharge: Continued Medical Work up  Expected Discharge Plan and Services Expected Discharge Plan: Skilled Nursing Facility In-house Referral: Clinical Social Work     Living arrangements for the past 2 months: Skilled Nursing Facility                                       Social Determinants of Health (SDOH) Interventions    Readmission Risk Interventions     No data to display

## 2022-04-12 NOTE — Progress Notes (Signed)
Patient had not further nausea this shift. Had large incontinent liquid stool. Abd remains distended with bowel sounds x 4 quads.  Abd x-ray pending.

## 2022-04-12 NOTE — Progress Notes (Signed)
Patient had large emesis after eating 50% of breakfast.  Also incontinent of large liquid stool.  Pericare given, patient treated with zofran IV per prn order.  No further c/o nausea at this time. Will continue to monitor.

## 2022-04-12 NOTE — Progress Notes (Signed)
Mobility Specialist Progress Note    04/12/22 1418  Mobility  Activity Transferred from bed to chair  Level of Assistance Minimal assist, patient does 75% or more  Assistive Device Front wheel walker  Distance Ambulated (ft) 2 ft  Activity Response Tolerated well  $Mobility charge 1 Mobility   Pt received and agreeable. No complaints. Left with call bell in reach.    [place] Mobility Specialist
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
## 2022-04-12 NOTE — Progress Notes (Signed)
PROGRESS NOTE  Cody [name] LAG:[id number] DOB: [birth year]   PCP: Ladell Pier, MD  Patient is from: SNF  DOA: 04/09/2022 LOS: 3  Chief complaints Chief Complaint  Patient presents with   Code Stroke   Hyperglycemia     Brief Narrative / Interim history: 61 yo M with PMH of GERD, allergic rhinitis and tobacco use disorder presents with confusion found to have DKA with newly diagnosed diabetes, acute respiratory failure with hypoxia likely due to aspiration pneumonia/pneumonitis, AKI, encephalopathy and strokelike symptoms.  He was treated with IV insulin and IV fluid.  DKA resolved.  He was transitioned to subcu insulin.  C-peptide low suggesting type 1 diabetes.  CVA work-up negative.  EEG did not show seizure.  Encephalopathy and AKI resolved.  He is on IV Unasyn for possible aspiration pneumonia.  Respiratory failure improving.  Slowly weaning of oxygen.  Hopefully discharge to SNF in the next 24 to 48 hours if medically stable.  Subjective: Seen and examined earlier this morning.  No major events overnight of this morning.  No complaints.  Denies chest pain, shortness of breath, GI or UTI symptoms.  Objective: Vitals:   04/11/22 1356 04/11/22 2214 04/12/22 0646 04/12/22 0908  BP: (!) 141/91 (!) 149/93 (!) 136/95 (!) 143/98  Pulse: (!) 103 (!) 106 (!) 113 82  Resp: 17 20 (!) 21 18  Temp: 98.4 F (36.9 C) 99.2 F (37.3 C) 98.5 F (36.9 C) 98.4 F (36.9 C)  TempSrc: Oral Oral Oral Oral  SpO2: 95% 93% 90% 90%  Weight:   104 kg   Height:        Examination:  GENERAL: No apparent distress.  Nontoxic. HEENT: MMM.  Vision and hearing grossly intact.  NECK: Supple.  No apparent JVD.  RESP:  No IWOB.  Fair aeration bilaterally. CVS:  RRR. Heart sounds normal.  ABD/GI/GU: BS+. Abd distended but not tender. MSK/EXT:  Moves extremities. No apparent deformity. No edema.  SKIN: no apparent skin lesion or wound NEURO: Awake and alert. Oriented appropriately.  No  apparent focal neuro deficit. PSYCH: Calm. Normal affect.   Procedures:  None  Microbiology summarized: COVID-19 PCR nonreactive. Blood cultures NGTD  Assessment and plan: Principal Problem:   DKA (diabetic ketoacidosis) ([place]) Active Problems:   GERD (gastroesophageal reflux disease)   Acute metabolic encephalopathy   AKI (acute kidney injury) ([place])   Dehydration   SIRS (systemic inflammatory response syndrome) ([facility])   Allergic rhinitis   DM-1 with DKA, hyperglycemia and hyperlipidemia  A1c 11.7%.  C-peptide low at 0.6.  LDL 151 DKA resolved. Recent Labs  Lab 04/11/22 1208 04/11/22 1647 04/11/22 2210 04/12/22 0800 04/12/22 1216  GLUCAP 317* 241* 213* 246* 182*  -Increase Semglee from 35 units at bedtime to 20 units twice daily -Continue SSI-resistant -Increase NovoLog from 5 to 8 units 3 times daily -Started Lipitor. -Further adjustment as appropriate.   Acute kidney injury: In the setting of DKA.  Resolved. Recent Labs    04/09/22 0119 04/09/22 0121 04/09/22 0515 04/09/22 1224 04/09/22 1108 04/09/22 1658 04/10/22 0223 04/11/22 0713 04/12/22 0312  BUN 37* 41* 32* 29* 30* 25* _0 CREATININE 2.98* 2.20* 2.36* 2.01* 1.86* 1.49* 1.18 1.04 0.97  -Monitor as needed   Acute metabolic encephalopathy/strokelike symptoms: CVA work-up unrevealing.  EEG unremarkable.  Encephalopathy resolved.  He is oriented x4. -Reorientation and delirium precautions   Acute hypoxic respiratory failure due to aspiration pneumonia: Required up to 10 L at some point.  CT  chest with patchy infiltrate.  Likely in the setting of encephalopathy.  TTE with normal LVEF and G1 DD.  Respiratory failure improving.  Procalcitonin improving. -Continue IV Unasyn -Wean off oxygen as able -Encourage incentive spirometry   GERD/abdominal distention: No GI symptoms or tenderness. -Check KUB for abdominal distention -Simethicone 4 times daily -Continue PPI   Mild hypernatremia:  Resolved. -Monitor  Hypokalemia: K3.1.  Mg within normal. -Replace and recheck  Tobacco use disorder -Encourage cessation -Continue Cotton patch  Generalized weakness/physical deconditioning -PT/OT recommended return to SNF for further therapy  Obesity Body mass index is 31.98 kg/m.         DVT prophylaxis:  SCDs Start: 04/09/22 6226  Code Status: Full code Family Communication: None at bedside Level of care: Med-Surg.  Status is: Inpatient Remains inpatient appropriate because: Newly diagnosed type 1 diabetes and aspiration pneumonia   Final disposition: SNF Consultants:  Neurology  Sch Meds:  Scheduled Meds:  atorvastatin  20 mg Oral Daily   insulin aspart  0-20 Units Subcutaneous TID WC   insulin aspart  0-5 Units Subcutaneous QHS   insulin aspart  8 Units Subcutaneous TID WC   insulin glargine-yfgn  20 Units Subcutaneous BID   insulin starter kit- pen needles  1 kit Other Once   simethicone  80 mg Oral QID   Continuous Infusions:  sodium chloride 10 mL/hr at 04/12/22 0557   ampicillin-sulbactam (UNASYN) IV 3 g (04/12/22 1324)   PRN Meds:.sodium chloride, acetaminophen **OR** acetaminophen, dextrose, nicotine, ondansetron (ZOFRAN) IV  Antimicrobials: Anti-infectives (From admission, onward)    Start     Dose/Rate Route Frequency Ordered Stop   04/09/22 1100  Ampicillin-Sulbactam (UNASYN) 3 g in sodium chloride 0.9 % 100 mL IVPB        3 g 200 mL/hr over 30 Minutes Intravenous Every 8 hours 04/09/22 1045          I have personally reviewed the following labs and images: CBC: Recent Labs  Lab 04/09/22 0119 04/09/22 0121 04/09/22 0141 04/09/22 0406 04/09/22 1031 04/11/22 0713 04/12/22 0312  WBC 12.8*  --   --  12.5*  --  5.6 9.0  NEUTROABS 10.0*  --   --  9.8*  --   --   --   HGB 18.2*   < > 18.7* 16.9 16.0 13.9 15.5  HCT 54.2*   < > 55.0* 48.8 47.0 39.8 42.8  MCV 101.3*  --   --  98.8  --  97.8 95.3  PLT 249  --   --  250  --  146* 174    < > = values in this interval not displayed.   BMP &GFR Recent Labs  Lab 04/09/22 0515 04/09/22 3335 04/09/22 1108 04/09/22 1658 04/10/22 0223 04/11/22 0713 04/12/22 0312  NA 139   < > 144 147* 148* 145 142  K 4.7   < > 4.5 3.9 3.8 3.4* 3.1*  CL 112*   < > 114* 116* 117* 109 108  CO2 11*   < > 16* _0 GLUCOSE 429*   < > 297* 217* 141* 201* 236*  BUN 32*   < > 30* 25* _1 CREATININE 2.36*   < > 1.86* 1.49* 1.18 1.04 0.97  CALCIUM 9.1   < > 9.5 9.7 9.4 9.4 10.1  MG 2.9*  --   --   --   --  2.2 2.2  PHOS 3.2  --   --   --   --  3.8 4.7*   < > = values in this interval not displayed.   Estimated Creatinine Clearance: 96.9 mL/min (by C-G formula based on SCr of 0.97 mg/dL). Liver & Pancreas: Recent Labs  Lab 04/09/22 0119 04/09/22 0515 04/11/22 0713 04/12/22 0312  AST 18 16  --   --   ALT 25 21  --   --   ALKPHOS 91 76  --   --   BILITOT 2.2* 2.0*  --   --   PROT 9.2* 7.7  --   --   ALBUMIN 4.8 4.0 3.1* 3.3*   No results for input(s): "LIPASE", "AMYLASE" in the last 168 hours. Recent Labs  Lab 04/09/22 0515  AMMONIA 32   Diabetic: No results for input(s): "HGBA1C" in the last 72 hours.  Recent Labs  Lab 04/11/22 1208 04/11/22 1647 04/11/22 2210 04/12/22 0800 04/12/22 1216  GLUCAP 317* 241* 213* 246* 182*   Cardiac Enzymes: No results for input(s): "CKTOTAL", "CKMB", "CKMBINDEX", "TROPONINI" in the last 168 hours. No results for input(s): "PROBNP" in the last 8760 hours. Coagulation Profile: Recent Labs  Lab 04/09/22 0119  INR 1.2   Thyroid Function Tests: No results for input(s): "TSH", "T4TOTAL", "FREET4", "T3FREE", "THYROIDAB" in the last 72 hours.  Lipid Profile: Recent Labs    04/12/22 0312  CHOL 235*  HDL 42  LDLCALC 151*  TRIG 208*  CHOLHDL 5.6   Anemia Panel: No results for input(s): "VITAMINB12", "FOLATE", "FERRITIN", "TIBC", "IRON", "RETICCTPCT" in the last 72 hours. Urine analysis:    Component Value Date/Time    COLORURINE YELLOW 04/09/2022 0250   APPEARANCEUR CLEAR 04/09/2022 0250   LABSPEC 1.027 04/09/2022 0250   PHURINE 5.0 04/09/2022 0250   GLUCOSEU >=500 (A) 04/09/2022 0250   HGBUR SMALL (A) 04/09/2022 0250   BILIRUBINUR NEGATIVE 04/09/2022 0250   KETONESUR 80 (A) 04/09/2022 0250   PROTEINUR 100 (A) 04/09/2022 0250   UROBILINOGEN 0.2 12/08/2009 2250   NITRITE NEGATIVE 04/09/2022 0250   LEUKOCYTESUR NEGATIVE 04/09/2022 0250   Sepsis Labs: Invalid input(s): "PROCALCITONIN", "LACTICIDVEN"  Microbiology: Recent Results (from the past 240 hour(s))  Resp Panel by RT-PCR (Flu A&B, Covid) Anterior Nasal Swab     Status: None   Collection Time: 04/09/22  2:53 AM   Specimen: Anterior Nasal Swab  Result Value Ref Range Status   SARS Coronavirus 2 by RT PCR NEGATIVE NEGATIVE Final    Comment: (NOTE) SARS-CoV-2 target nucleic acids are NOT DETECTED.  The SARS-CoV-2 RNA is generally detectable in upper respiratory specimens during the acute phase of infection. The lowest concentration of SARS-CoV-2 viral copies this assay can detect is 138 copies/mL. A negative result does not preclude SARS-Cov-2 infection and should not be used as the sole basis for treatment or other patient management decisions. A negative result may occur with  improper specimen collection/handling, submission of specimen other than nasopharyngeal swab, presence of viral mutation(s) within the areas targeted by this assay, and inadequate number of viral copies(<138 copies/mL). A negative result must be combined with clinical observations, patient history, and epidemiological information. The expected result is Negative.  Fact Sheet for Patients:  EntrepreneurPulse.com.au  Fact Sheet for Healthcare Providers:  IncredibleEmployment.be  This test is no t yet approved or cleared by the [place] FDA and  has been authorized for detection and/or diagnosis of SARS-CoV-2 by FDA under  an Emergency Use Authorization (EUA). This EUA will remain  in effect (meaning this test can be used) for the duration of the COVID-19 declaration under  Section 564(b)(1) of the Act, 21 U.S.C.section 360bbb-3(b)(1), unless the authorization is terminated  or revoked sooner.       Influenza A by PCR NEGATIVE NEGATIVE Final   Influenza B by PCR NEGATIVE NEGATIVE Final    Comment: (NOTE) The Xpert Xpress SARS-CoV-2/FLU/RSV plus assay is intended as an aid in the diagnosis of influenza from Nasopharyngeal swab specimens and should not be used as a sole basis for treatment. Nasal washings and aspirates are unacceptable for Xpert Xpress SARS-CoV-2/FLU/RSV testing.  Fact Sheet for Patients: EntrepreneurPulse.com.au  Fact Sheet for Healthcare Providers: IncredibleEmployment.be  This test is not yet approved or cleared by the [place] FDA and has been authorized for detection and/or diagnosis of SARS-CoV-2 by FDA under an Emergency Use Authorization (EUA). This EUA will remain in effect (meaning this test can be used) for the duration of the COVID-19 declaration under Section 564(b)(1) of the Act, 21 U.S.C. section 360bbb-3(b)(1), unless the authorization is terminated or revoked.  Performed at [facility], [place] [address] [place], [place] [address]   Culture, blood (Routine X 2) w Reflex to ID Panel     Status: None (Preliminary result)   Collection Time: 04/09/22 10:29 AM   Specimen: BLOOD  Result Value Ref Range Status   Specimen Description BLOOD SITE NOT SPECIFIED  Final   Special Requests   Final    BOTTLES DRAWN AEROBIC AND ANAEROBIC Blood Culture adequate volume   Culture   Final    NO GROWTH 3 DAYS Performed at [facility], [address], [place], [place] [address]    Report Status PENDING  Incomplete  Culture, blood (Routine X 2) w Reflex to ID Panel     Status: None (Preliminary result)   Collection Time: 04/09/22  10:38 PM   Specimen: BLOOD RIGHT HAND  Result Value Ref Range Status   Specimen Description BLOOD RIGHT HAND  Final   Special Requests   Final    BOTTLES DRAWN AEROBIC AND ANAEROBIC Blood Culture adequate volume   Culture   Final    NO GROWTH 3 DAYS Performed at [facility], [place] [address] [place], [place] [address]    Report Status PENDING  Incomplete    Radiology Studies: No results found.    Jaquelinne Glendening T. [place]  If 7PM-7AM, please contact night-coverage [email or web address] 04/12/2022, 2:14 PM

## 2022-04-13 DIAGNOSIS — E109 Type 1 diabetes mellitus without complications: Secondary | ICD-10-CM | POA: Diagnosis not present

## 2022-04-13 DIAGNOSIS — E1010 Type 1 diabetes mellitus with ketoacidosis without coma: Secondary | ICD-10-CM | POA: Diagnosis not present

## 2022-04-13 DIAGNOSIS — G9341 Metabolic encephalopathy: Secondary | ICD-10-CM | POA: Diagnosis not present

## 2022-04-13 DIAGNOSIS — N179 Acute kidney failure, unspecified: Secondary | ICD-10-CM | POA: Diagnosis not present

## 2022-04-13 DIAGNOSIS — K567 Ileus, unspecified: Secondary | ICD-10-CM

## 2022-04-13 LAB — RENAL FUNCTION PANEL
Albumin: 3.2 g/dL — ABNORMAL LOW (ref 3.5–5.0)
Anion gap: 10 (ref 5–15)
BUN: 14 mg/dL (ref 8–23)
CO2: 27 mmol/L (ref 22–32)
Calcium: 9.6 mg/dL (ref 8.9–10.3)
Chloride: 104 mmol/L (ref 98–111)
Creatinine, Ser: 0.91 mg/dL (ref 0.61–1.24)
GFR, Estimated: >60 mL/min (ref >60)
Glucose, Bld: 167 mg/dL — ABNORMAL HIGH (ref 70–99)
Phosphorus: 4 mg/dL (ref 2.5–4.6)
Potassium: 3.4 mmol/L — ABNORMAL LOW (ref 3.5–5.1)
Sodium: 141 mmol/L (ref 135–145)

## 2022-04-13 LAB — GLUCOSE, CAPILLARY
Glucose-Capillary: 159 mg/dL — ABNORMAL HIGH (ref 70–99)
Glucose-Capillary: 161 mg/dL — ABNORMAL HIGH (ref 70–99)
Glucose-Capillary: 168 mg/dL — ABNORMAL HIGH (ref 70–99)
Glucose-Capillary: 190 mg/dL — ABNORMAL HIGH (ref 70–99)
Glucose-Capillary: 203 mg/dL — ABNORMAL HIGH (ref 70–99)
Glucose-Capillary: 215 mg/dL — ABNORMAL HIGH (ref 70–99)

## 2022-04-13 LAB — CBC
HCT: 42.2 % (ref 39.0–52.0)
Hemoglobin: 15.2 g/dL (ref 13.0–17.0)
MCH: 34.5 pg — ABNORMAL HIGH (ref 26.0–34.0)
MCHC: 36 g/dL (ref 30.0–36.0)
MCV: 95.9 fL (ref 80.0–100.0)
Platelets: 171 10*3/uL (ref 150–400)
RBC: 4.4 MIL/uL (ref 4.22–5.81)
RDW: 12.1 % (ref 11.5–15.5)
WBC: 8.4 10*3/uL (ref 4.0–10.5)
nRBC: 0 % (ref 0.0–0.2)

## 2022-04-13 LAB — MAGNESIUM: Magnesium: 2.1 mg/dL (ref 1.7–2.4)

## 2022-04-13 MED ORDER — POTASSIUM CHLORIDE CRYS ER 20 MEQ PO TBCR
40.0000 meq | EXTENDED_RELEASE_TABLET | ORAL | Status: AC
  Administered 2022-04-13 (×2): 40 meq via ORAL
  Filled 2022-04-13 (×2): qty 2

## 2022-04-13 NOTE — Progress Notes (Signed)
Physical Therapy Treatment Patient Details Name: Cody [name] MRN: [id number] DOB: [birth year] Today's Date: 04/13/2022   History of Present Illness Patient is a 61 year old male with DKA, new onset of diabetes mellitus. Initial concern for stroke although work-up unremarkable. Acute metabolic encephalopathy with improving mentation. Acute hypoxic respiratory failure with aspiration pneumonia.    PT Comments    Today's skilled session continued to focus on strengthening and mobility progression with less overall assistance needed today. Acute PT to continue during pt's hospital stay.   Recommendations for follow up therapy are one component of a multi-disciplinary discharge planning process, led by the attending physician.  Recommendations may be updated based on patient status, additional functional criteria and insurance authorization.  Follow Up Recommendations  Skilled nursing-short term rehab (<3 hours/day) Can patient physically be transported by private vehicle: No   Assistance Recommended at Discharge Frequent or constant Supervision/Assistance  Patient can return home with the following A lot of help with walking and/or transfers;A lot of help with bathing/dressing/bathroom;Help with stairs or ramp for entrance;Assist for transportation;Assistance with cooking/housework   Equipment Recommendations  None recommended by PT    Precautions / Restrictions Precautions Precautions: Fall Restrictions Weight Bearing Restrictions: No     Mobility  Bed Mobility Overal bed mobility: Needs Assistance Bed Mobility: Supine to Sit, Sit to Supine     Supine to sit: Min assist Sit to supine: Min assist   General bed mobility comments: cues on technique with rail/HOB 30 degrees- min assist to complete trunk transition into sitting at EOB. once upright pt able to use UE's to fully scoot to edge of bed. at end of session min assist needed to clear bed surface with LE's while pt lowered  trunk down in controlled manner.    Transfers Overall transfer level: Needs assistance Equipment used: Rolling walker (2 wheels) Transfers: Sit to/from Stand Sit to Stand: Min assist           General transfer comment: from elevated bed surface (hips elevated over knees) x 2 reps with cues for hand placement and weight shifting to stand. cues to use arms to controll descent both times.    Ambulation/Gait Ambulation/Gait assistance: Min assist   Assistive device: Rolling walker (2 wheels)   Gait velocity: decreased     General Gait Details: 3-4 lateral steps along edge of bed with min assist/cues on technqiue with 1st stand. alternating marching in place X5-6 reps each side with 2cd stand with min guard assist.       Cognition Arousal/Alertness: Awake/alert Behavior During Therapy: WFL for tasks assessed/performed Overall Cognitive Status: Within Functional Limits for tasks assessed            Exercises General Exercises - Lower Extremity Ankle Circles/Pumps: AROM, Strengthening, Both, 10 reps, Supine, Limitations Ankle Circles/Pumps Limitations: manual resisted PF Quad Sets: AROM, Strengthening, Both, 10 reps, Supine, Limitations Quad Sets Limitations: 5 second holds Straight Leg Raises: AROM, AAROM, Strengthening, Both, 10 reps, Supine     Pertinent Vitals/Pain Pain Assessment Pain Assessment: No/denies pain     PT Goals (current goals can now be found in the care plan section) Acute Rehab PT Goals Patient Stated Goal: to go home PT Goal Formulation: With patient Time For Goal Achievement: 04/18/22 Potential to Achieve Goals: Fair Progress towards PT goals: Progressing toward goals    Frequency    Min 3X/week      PT Plan Current plan remains appropriate    AM-PAC PT "6 Clicks" Mobility  Outcome Measure  Help needed turning from your back to your side while in a flat bed without using bedrails?: A Lot Help needed moving from lying on your  back to sitting on the side of a flat bed without using bedrails?: A Lot Help needed moving to and from a bed to a chair (including a wheelchair)?: A Lot Help needed standing up from a chair using your arms (e.g., wheelchair or bedside chair)?: A Little Help needed to walk in hospital room?: A Lot Help needed climbing 3-5 steps with a railing? : Total 6 Click Score: 12    End of Session Equipment Utilized During Treatment: Gait belt Activity Tolerance: Patient tolerated treatment well Patient left: in bed;with call bell/phone within reach;with SCD's reapplied Nurse Communication: Mobility status PT Visit Diagnosis: Unsteadiness on feet (R26.81);Muscle weakness (generalized) (M62.81)     Time: 2637-8588 PT Time Calculation (min) (ACUTE ONLY): 25 min  Charges:  $Therapeutic Exercise: 8-22 mins $Therapeutic Activity: 8-22 mins                    Sallyanne Kuster, PTA, [facility] Acute [employer] Office- [phone] 04/13/22, 12:17 PM   Sallyanne Kuster 04/13/2022, 12:15 PM

## 2022-04-13 NOTE — TOC Progression Note (Signed)
Transition of Care [facility]) - Progression Note    Patient Details  Name: Cody [name] MRN: [id number] Date of Birth: 02/24/1961  Transition of [facility]) CM/SW Contact  Delilah Shan, LCSWA Phone Number: 04/13/2022, 2:52 PM  Clinical Narrative:     Plan to return to [facility] when medically ready for dc. CSW will continue to follow and assist with patients dc planning needs.  Expected Discharge Plan: Skilled Nursing Facility Barriers to Discharge: Continued Medical Work up  Expected Discharge Plan and Services Expected Discharge Plan: Skilled Nursing Facility In-house Referral: Clinical Social Work     Living arrangements for the past 2 months: Skilled Nursing Facility                                       Social Determinants of Health (SDOH) Interventions    Readmission Risk Interventions     No data to display

## 2022-04-13 NOTE — Progress Notes (Addendum)
PROGRESS NOTE  Cody [name] MRN:[id number] DOB: 05/30/1961   PCP: Johnson, Deborah B, MD  Patient is from: SNF  DOA: 04/09/2022 LOS: 4  Chief complaints Chief Complaint  Patient presents with   Code Stroke   Hyperglycemia     Brief Narrative / Interim history: 60 yo M with PMH of GERD, allergic rhinitis and tobacco use disorder presents with confusion found to have DKA with newly diagnosed diabetes, acute respiratory failure with hypoxia likely due to aspiration pneumonia/pneumonitis, AKI, encephalopathy and strokelike symptoms.  He was treated with IV insulin and IV fluid.  DKA resolved.  He was transitioned to subcu insulin.  C-peptide low suggesting type 1 diabetes.  CVA work-up negative.  EEG did not show seizure.  Encephalopathy and AKI resolved.  He is on IV Unasyn for possible aspiration pneumonia.  Respiratory failure improving.  Slowly weaning of oxygen.  Unfortunately, patient developed ileus.   Subjective: Seen and examined earlier this morning.  Patient had abdominal pain and vomiting last night.  He also had diarrhea.  KUB showed ileus.  He was made n.p.o. overnight.  Still with distended abdomen.  He reports up "some" belly pain this morning.  Denies chest pain or dyspnea.  Objective: Vitals:   04/13/22 1009 04/13/22 1039 04/13/22 1128 04/13/22 1138  BP: (!) 124/91 125/77 (!) 155/98 134/89  Pulse: 92 97 94 88  Resp:   18   Temp:   97.8 F (36.6 C)   TempSrc:   Oral   SpO2: 94% 93% 93% 90%  Weight:      Height:        Examination:  GENERAL: No apparent distress.  Nontoxic. HEENT: MMM.  Vision and hearing grossly intact.  NECK: Supple.  No apparent JVD.  RESP:  No IWOB.  Fair aeration bilaterally. CVS:  RRR. Heart sounds normal.  ABD/GI/GU: BS+. Abd distended but not tender. MSK/EXT:  Moves extremities. No apparent deformity. No edema.  SKIN: no apparent skin lesion or wound NEURO: Awake and alert. Oriented appropriately.  No apparent focal neuro  deficit. PSYCH: Calm. Normal affect.   Procedures:  None  Microbiology summarized: COVID-19 PCR nonreactive. Blood cultures NGTD  Assessment and plan: Principal Problem:   DKA (diabetic ketoacidosis) (HCC) Active Problems:   GERD (gastroesophageal reflux disease)   Acute metabolic encephalopathy   AKI (acute kidney injury) (HCC)   Dehydration   SIRS (systemic inflammatory response syndrome) (HCC)   Allergic rhinitis   DM-1 with DKA, hyperglycemia and hyperlipidemia  A1c 11.7%.  C-peptide low at 0.6.  LDL 151 DKA resolved. Recent Labs  Lab 04/12/22 2210 04/13/22 0011 04/13/22 0427 04/13/22 0806 04/13/22 1128  GLUCAP 177* 190* 161* 159* 168*  -Continue Semglee 20 units twice daily -Continue SSI-sensitive -Continue Lipitor -Further adjustment as appropriate.   Acute kidney injury: In the setting of DKA.  Resolved. Recent Labs    04/09/22 0119 04/09/22 0121 04/09/22 0515 04/09/22 0938 04/09/22 1108 04/09/22 1658 04/10/22 0223 04/11/22 0713 04/12/22 0312 04/13/22 0506  BUN 37* 41* 32* 29* 30* 25* 21 14 15 14  CREATININE 2.98* 2.20* 2.36* 2.01* 1.86* 1.49* 1.18 1.04 0.97 0.91  -Monitor as needed   Acute metabolic encephalopathy/strokelike symptoms: CVA work-up unrevealing.  EEG unremarkable.  Encephalopathy resolved.  He is oriented x4. -Reorientation and delirium precautions   Severe sepsis with acute hypoxic respiratory failure due to aspiration pneumonia: POA.  Required up to 10 L at some point.  CT chest with patchy infiltrate.  Likely in the setting of   encephalopathy.  TTE with normal LVEF and G1 DD.  Respiratory failure improving.  Procalcitonin and sepsis physiology resolved. -Continue IV Unasyn for total of 5 days -Wean off oxygen as able -Encourage incentive spirometry   Ileus: Patient had emesis and abdominal pain.  KUB suggests ileus.  He has loose stool. -Full liquid diet -Frequent ambulation as able -Simethicone 4 times daily  GERD: -Continue  PPI   Mild hypernatremia: Resolved. -Monitor  Hypokalemia: K 3.4.  Mg normal. -Replace and recheck  Tobacco use disorder -Encourage cessation -Continue Cotton patch  Generalized weakness/physical deconditioning -PT/OT recommended return to SNF for further therapy  Obesity Body mass index is 32.38 kg/m.         DVT prophylaxis:  SCDs Start: 04/09/22 6644  Code Status: Full code Family Communication: None at bedside Level of care: Med-Surg.  Status is: Inpatient Remains inpatient appropriate because: Newly diagnosed DM-1, aspiration pneumonia and ileus   Final disposition: SNF Consultants:  Neurology-signed off  Sch Meds:  Scheduled Meds:  atorvastatin  20 mg Oral Daily   insulin aspart  0-9 Units Subcutaneous Q4H   insulin glargine-yfgn  20 Units Subcutaneous BID   insulin starter kit- pen needles  1 kit Other Once   potassium chloride  40 mEq Oral Q4H   simethicone  80 mg Oral QID   Continuous Infusions:  sodium chloride 10 mL/hr at 04/13/22 0540   ampicillin-sulbactam (UNASYN) IV 3 g (04/13/22 0541)   PRN Meds:.sodium chloride, acetaminophen **OR** acetaminophen, dextrose, nicotine, ondansetron (ZOFRAN) IV, polyethylene glycol, senna-docusate  Antimicrobials: Anti-infectives (From admission, onward)    Start     Dose/Rate Route Frequency Ordered Stop   04/09/22 1100  Ampicillin-Sulbactam (UNASYN) 3 g in sodium chloride 0.9 % 100 mL IVPB        3 g 200 mL/hr over 30 Minutes Intravenous Every 8 hours 04/09/22 1045          I have personally reviewed the following labs and images: CBC: Recent Labs  Lab 04/09/22 0119 04/09/22 0121 04/09/22 0406 04/09/22 1031 04/11/22 0713 04/12/22 0312 04/13/22 0506  WBC 12.8*  --  12.5*  --  5.6 9.0 8.4  NEUTROABS 10.0*  --  9.8*  --   --   --   --   HGB 18.2*   < > 16.9 16.0 13.9 15.5 15.2  HCT 54.2*   < > 48.8 47.0 39.8 42.8 42.2  MCV 101.3*  --  98.8  --  97.8 95.3 95.9  PLT 249  --  250  --  146* 174  171   < > = values in this interval not displayed.   BMP &GFR Recent Labs  Lab 04/09/22 0515 04/09/22 0347 04/09/22 1658 04/10/22 0223 04/11/22 0713 04/12/22 0312 04/13/22 0506  NA 139   < > 147* 148* 145 142 141  K 4.7   < > 3.9 3.8 3.4* 3.1* 3.4*  CL 112*   < > 116* 117* 109 108 104  CO2 11*   < > _0 GLUCOSE 429*   < > 217* 141* 201* 236* 167*  BUN 32*   < > 25* _1 CREATININE 2.36*   < > 1.49* 1.18 1.04 0.97 0.91  CALCIUM 9.1   < > 9.7 9.4 9.4 10.1 9.6  MG 2.9*  --   --   --  2.2 2.2 2.1  PHOS 3.2  --   --   --  3.8 4.7* 4.0   < > =  values in this interval not displayed.   Estimated Creatinine Clearance: 103.9 mL/min (by C-G formula based on SCr of 0.91 mg/dL). Liver & Pancreas: Recent Labs  Lab 04/09/22 0119 04/09/22 0515 04/11/22 0713 04/12/22 0312 04/13/22 0506  AST 18 16  --   --   --   ALT 25 21  --   --   --   ALKPHOS 91 76  --   --   --   BILITOT 2.2* 2.0*  --   --   --   PROT 9.2* 7.7  --   --   --   ALBUMIN 4.8 4.0 3.1* 3.3* 3.2*   No results for input(s): "LIPASE", "AMYLASE" in the last 168 hours. Recent Labs  Lab 04/09/22 0515  AMMONIA 32   Diabetic: No results for input(s): "HGBA1C" in the last 72 hours.  Recent Labs  Lab 04/12/22 2210 04/13/22 0011 04/13/22 0427 04/13/22 0806 04/13/22 1128  GLUCAP 177* 190* 161* 159* 168*   Cardiac Enzymes: No results for input(s): "CKTOTAL", "CKMB", "CKMBINDEX", "TROPONINI" in the last 168 hours. No results for input(s): "PROBNP" in the last 8760 hours. Coagulation Profile: Recent Labs  Lab 04/09/22 0119  INR 1.2   Thyroid Function Tests: No results for input(s): "TSH", "T4TOTAL", "FREET4", "T3FREE", "THYROIDAB" in the last 72 hours.  Lipid Profile: Recent Labs    04/12/22 0312  CHOL 235*  HDL 42  LDLCALC 151*  TRIG 208*  CHOLHDL 5.6   Anemia Panel: No results for input(s): "VITAMINB12", "FOLATE", "FERRITIN", "TIBC", "IRON", "RETICCTPCT" in the last 72  hours. Urine analysis:    Component Value Date/Time   COLORURINE YELLOW 04/09/2022 0250   APPEARANCEUR CLEAR 04/09/2022 0250   LABSPEC 1.027 04/09/2022 0250   PHURINE 5.0 04/09/2022 0250   GLUCOSEU >=500 (A) 04/09/2022 0250   HGBUR SMALL (A) 04/09/2022 0250   BILIRUBINUR NEGATIVE 04/09/2022 0250   KETONESUR 80 (A) 04/09/2022 0250   PROTEINUR 100 (A) 04/09/2022 0250   UROBILINOGEN 0.2 12/08/2009 2250   NITRITE NEGATIVE 04/09/2022 0250   LEUKOCYTESUR NEGATIVE 04/09/2022 0250   Sepsis Labs: Invalid input(s): "PROCALCITONIN", "LACTICIDVEN"  Microbiology: Recent Results (from the past 240 hour(s))  Resp Panel by RT-PCR (Flu A&B, Covid) Anterior Nasal Swab     Status: None   Collection Time: 04/09/22  2:53 AM   Specimen: Anterior Nasal Swab  Result Value Ref Range Status   SARS Coronavirus 2 by RT PCR NEGATIVE NEGATIVE Final    Comment: (NOTE) SARS-CoV-2 target nucleic acids are NOT DETECTED.  The SARS-CoV-2 RNA is generally detectable in upper respiratory specimens during the acute phase of infection. The lowest concentration of SARS-CoV-2 viral copies this assay can detect is 138 copies/mL. A negative result does not preclude SARS-Cov-2 infection and should not be used as the sole basis for treatment or other patient management decisions. A negative result may occur with  improper specimen collection/handling, submission of specimen other than nasopharyngeal swab, presence of viral mutation(s) within the areas targeted by this assay, and inadequate number of viral copies(<138 copies/mL). A negative result must be combined with clinical observations, patient history, and epidemiological information. The expected result is Negative.  Fact Sheet for Patients:  EntrepreneurPulse.com.au  Fact Sheet for Healthcare Providers:  IncredibleEmployment.be  This test is no t yet approved or cleared by the [place] FDA and  has been authorized  for detection and/or diagnosis of SARS-CoV-2 by FDA under an Emergency Use Authorization (EUA). This EUA will remain  in effect (meaning this test  can be used) for the duration of the COVID-19 declaration under Section 564(b)(1) of the Act, 21 U.S.C.section 360bbb-3(b)(1), unless the authorization is terminated  or revoked sooner.       Influenza A by PCR NEGATIVE NEGATIVE Final   Influenza B by PCR NEGATIVE NEGATIVE Final    Comment: (NOTE) The Xpert Xpress SARS-CoV-2/FLU/RSV plus assay is intended as an aid in the diagnosis of influenza from Nasopharyngeal swab specimens and should not be used as a sole basis for treatment. Nasal washings and aspirates are unacceptable for Xpert Xpress SARS-CoV-2/FLU/RSV testing.  Fact Sheet for Patients: https://www.fda.gov/media/152166/download  Fact Sheet for Healthcare Providers: https://www.fda.gov/media/152162/download  This test is not yet approved or cleared by the [place] FDA and has been authorized for detection and/or diagnosis of SARS-CoV-2 by FDA under an Emergency Use Authorization (EUA). This EUA will remain in effect (meaning this test can be used) for the duration of the COVID-19 declaration under Section 564(b)(1) of the Act, 21 U.S.C. section 360bbb-3(b)(1), unless the authorization is terminated or revoked.  Performed at [facility], [address] [place], [place] [address]   Culture, blood (Routine X 2) w Reflex to ID Panel     Status: None (Preliminary result)   Collection Time: 04/09/22 10:29 AM   Specimen: BLOOD  Result Value Ref Range Status   Specimen Description BLOOD SITE NOT SPECIFIED  Final   Special Requests   Final    BOTTLES DRAWN AEROBIC AND ANAEROBIC Blood Culture adequate volume   Culture   Final    NO GROWTH 4 DAYS Performed at [facility], [address] [place], [place] [address]    Report Status PENDING  Incomplete  Culture, blood (Routine X 2) w Reflex to ID Panel      Status: None (Preliminary result)   Collection Time: 04/09/22 10:38 PM   Specimen: BLOOD RIGHT HAND  Result Value Ref Range Status   Specimen Description BLOOD RIGHT HAND  Final   Special Requests   Final    BOTTLES DRAWN AEROBIC AND ANAEROBIC Blood Culture adequate volume   Culture   Final    NO GROWTH 4 DAYS Performed at [facility], [address] [place],  [address]    Report Status PENDING  Incomplete    Radiology Studies: DG Abd Portable 1V  Result Date: 04/12/2022 CLINICAL DATA:  Abdomen distension EXAM: PORTABLE ABDOMEN - 1 VIEW COMPARISON:  CT 10/19/2015 FINDINGS: Diffuse increased small and large bowel gas. Mild stool in the colon. No radiopaque calculi IMPRESSION: Diffuse increased small and large bowel gas suggestive of an ileus. Electronically Signed   By: Kim  Fujinaga M.D.   On: 04/12/2022 20:26      Taye T[facility]  If 7PM-7AM, please contact night-coverage [email or web address] 04/13/2022, 1:48 PM   

## 2022-04-14 ENCOUNTER — Inpatient Hospital Stay (HOSPITAL_COMMUNITY): Payer: Medicaid Other

## 2022-04-14 DIAGNOSIS — N179 Acute kidney failure, unspecified: Secondary | ICD-10-CM | POA: Diagnosis not present

## 2022-04-14 DIAGNOSIS — E1010 Type 1 diabetes mellitus with ketoacidosis without coma: Secondary | ICD-10-CM | POA: Diagnosis not present

## 2022-04-14 DIAGNOSIS — G9341 Metabolic encephalopathy: Secondary | ICD-10-CM | POA: Diagnosis not present

## 2022-04-14 DIAGNOSIS — E109 Type 1 diabetes mellitus without complications: Secondary | ICD-10-CM | POA: Diagnosis not present

## 2022-04-14 LAB — RENAL FUNCTION PANEL
Albumin: 3.1 g/dL — ABNORMAL LOW (ref 3.5–5.0)
Anion gap: 8 (ref 5–15)
BUN: 16 mg/dL (ref 8–23)
CO2: 26 mmol/L (ref 22–32)
Calcium: 9.7 mg/dL (ref 8.9–10.3)
Chloride: 108 mmol/L (ref 98–111)
Creatinine, Ser: 0.91 mg/dL (ref 0.61–1.24)
GFR, Estimated: >60 mL/min (ref >60)
Glucose, Bld: 162 mg/dL — ABNORMAL HIGH (ref 70–99)
Phosphorus: 4.1 mg/dL (ref 2.5–4.6)
Potassium: 4 mmol/L (ref 3.5–5.1)
Sodium: 142 mmol/L (ref 135–145)

## 2022-04-14 LAB — CULTURE, BLOOD (ROUTINE X 2)
Culture: NO GROWTH
Culture: NO GROWTH
Special Requests: ADEQUATE
Special Requests: ADEQUATE

## 2022-04-14 LAB — GLUCOSE, CAPILLARY
Glucose-Capillary: 157 mg/dL — ABNORMAL HIGH (ref 70–99)
Glucose-Capillary: 169 mg/dL — ABNORMAL HIGH (ref 70–99)
Glucose-Capillary: 175 mg/dL — ABNORMAL HIGH (ref 70–99)
Glucose-Capillary: 182 mg/dL — ABNORMAL HIGH (ref 70–99)
Glucose-Capillary: 183 mg/dL — ABNORMAL HIGH (ref 70–99)
Glucose-Capillary: 201 mg/dL — ABNORMAL HIGH (ref 70–99)

## 2022-04-14 MED ORDER — LACTATED RINGERS IV SOLN: INTRAVENOUS | Status: DC

## 2022-04-14 MED ORDER — BISACODYL 10 MG RE SUPP
10.0000 mg | Freq: Every day | RECTAL | Status: DC
  Administered 2022-04-14 – 2022-05-05 (×18): 10 mg via RECTAL
  Filled 2022-04-14 (×20): qty 1

## 2022-04-14 MED ORDER — IOHEXOL 300 MG/ML  SOLN
100.0000 mL | Freq: Once | INTRAMUSCULAR | Status: AC | PRN
  Administered 2022-04-14: 100 mL via INTRAVENOUS

## 2022-04-14 MED ORDER — INSULIN ASPART 100 UNIT/ML IJ SOLN
0.0000 [IU] | INTRAMUSCULAR | Status: DC
  Administered 2022-04-14 – 2022-04-15 (×3): 3 [IU] via SUBCUTANEOUS
  Administered 2022-04-15: 2 [IU] via SUBCUTANEOUS
  Administered 2022-04-15: 3 [IU] via SUBCUTANEOUS
  Administered 2022-04-15: 2 [IU] via SUBCUTANEOUS
  Administered 2022-04-15: 3 [IU] via SUBCUTANEOUS
  Administered 2022-04-16: 1 [IU] via SUBCUTANEOUS
  Administered 2022-04-16: 2 [IU] via SUBCUTANEOUS
  Administered 2022-04-16: 1 [IU] via SUBCUTANEOUS
  Administered 2022-04-16: 3 [IU] via SUBCUTANEOUS
  Administered 2022-04-16 (×2): 2 [IU] via SUBCUTANEOUS
  Administered 2022-04-16 – 2022-04-17 (×3): 1 [IU] via SUBCUTANEOUS
  Administered 2022-04-17: 2 [IU] via SUBCUTANEOUS
  Administered 2022-04-18 (×3): 1 [IU] via SUBCUTANEOUS
  Administered 2022-04-18: 2 [IU] via SUBCUTANEOUS
  Administered 2022-04-18 – 2022-04-19 (×3): 1 [IU] via SUBCUTANEOUS
  Administered 2022-04-19: 2 [IU] via SUBCUTANEOUS

## 2022-04-14 MED ORDER — IOHEXOL 9 MG/ML PO SOLN
ORAL | Status: AC
  Administered 2022-04-14 (×2): 500 mL
  Filled 2022-04-14: qty 1000
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
## 2022-04-14 NOTE — Progress Notes (Signed)
PROGRESS NOTE  Cody [name] VEH:[id number] DOB: 03-06-1961   PCP: Ladell Pier, MD  Patient is from: SNF  DOA: 04/09/2022 LOS: 5  Chief complaints Chief Complaint  Patient presents with   Code Stroke   Hyperglycemia     Brief Narrative / Interim history: 61 yo M with PMH of GERD, allergic rhinitis and tobacco use disorder presents with confusion found to have DKA with newly diagnosed diabetes, acute respiratory failure with hypoxia likely due to aspiration pneumonia/pneumonitis, AKI, encephalopathy and strokelike symptoms.  He was treated with IV insulin and IV fluid.  DKA resolved.  He was transitioned to subcu insulin.  C-peptide low suggesting type 1 diabetes.  CVA work-up negative.  EEG did not show seizure.  Encephalopathy and AKI resolved.  He is on IV Unasyn for possible aspiration pneumonia.  Respiratory failure improving.  Slowly weaning of oxygen.  Unfortunately, patient developed ileus.   Subjective: Seen and examined earlier this morning.  Patient had an episode of emesis earlier this morning.  He had loose bowel movement yesterday afternoon.  He denies abdominal pain.  Denies chest pain or dyspnea.  Abdomen remains distended.  Objective: Vitals:   04/14/22 0500 04/14/22 0516 04/14/22 0727 04/14/22 0900  BP:  117/78 126/88   Pulse:  99 99   Resp:  16    Temp:  98.2 F (36.8 C) 98.2 F (36.8 C)   TempSrc:  Oral Oral   SpO2:  95% 93% 90%  Weight: 104.5 kg     Height:        Examination:  GENERAL: No apparent distress.  Nontoxic. HEENT: MMM.  Vision and hearing grossly intact.  NECK: Supple.  No apparent JVD.  RESP:  No IWOB.  Fair aeration bilaterally. CVS:  RRR. Heart sounds normal.  ABD/GI/GU: BS+. Abd distended and firm.  No tenderness. MSK/EXT:  Moves extremities. No apparent deformity. No edema.  SKIN: no apparent skin lesion or wound NEURO: Awake and alert. Oriented appropriately.  No apparent focal neuro deficit. PSYCH: Calm.  Somewhat flat  affect.  Procedures:  None  Microbiology summarized: COVID-19 PCR nonreactive. Blood cultures NGTD  Assessment and plan: Principal Problem:   DKA (diabetic ketoacidosis) ([place]) Active Problems:   GERD (gastroesophageal reflux disease)   Acute metabolic encephalopathy   Severe sepsis ([facility])   AKI (acute kidney injury) ([place])   Dehydration   SIRS (systemic inflammatory response syndrome) (H[facility])   Allergic rhinitis   Ileus ([facility])   DM-1 with DKA, hyperglycemia and hyperlipidemia  A1c 11.7%.  C-peptide 0.6.  LDL 151 DKA resolved. Recent Labs  Lab 04/13/22 2044 04/14/22 0041 04/14/22 0354 04/14/22 0737 04/14/22 1312  GLUCAP 215* 183* 169* 157* 175*  -Continue Semglee 20 units twice daily -Increase SSI to moderate -Continue Lipitor -Further adjustment as appropriate.   Acute kidney injury: In the setting of DKA.  Resolved. Recent Labs    04/09/22 0121 04/09/22 0515 04/09/22 8366 04/09/22 1108 04/09/22 1658 04/10/22 0223 04/11/22 0713 04/12/22 0312 04/13/22 0506 04/14/22 0949  BUN 41* 32* 29* 30* 25* '21 14 15 14 16  ' CREATININE 2.20* 2.36* 2.01* 1.86* 1.49* 1.18 1.04 0.97 0.91 0.91  -Monitor as needed   Acute metabolic encephalopathy/strokelike symptoms: CVA work-up unrevealing.  EEG unremarkable.  Encephalopathy resolved.  He is oriented x4. -Reorientation and delirium precautions   Severe sepsis with acute hypoxic respiratory failure due to aspiration pneumonia: POA.  Required up to 10 L at some point.  CT chest with patchy infiltrate.  Likely in  the setting of encephalopathy.  TTE with normal LVEF and G1 DD.  Respiratory failure improving.  Procalcitonin and sepsis physiology resolved. -Continue IV Unasyn for total of 5 days -Wean off oxygen as able -Encourage incentive spirometry   Ileus: patient with emesis and loose stool.  Abdomen distended and firm.  Nontender.  He is not on opiate. -Continue full liquid diet -CT abdomen and pelvis -Frequent ambulation as  able -Continue simethicone 4 times daily  GERD: -Continue PPI   Mild hypernatremia: Resolved. -Monitor  Hypokalemia: Resolved. -Replace and recheck  Tobacco use disorder -Encourage cessation -Continue nicotine patch  Generalized weakness/physical deconditioning -PT/OT recommended return to SNF for further therapy  Obesity Body mass index is 32.13 kg/m.         DVT prophylaxis:  SCDs Start: 04/09/22 2694  Code Status: Full code Family Communication: None at bedside Level of care: Med-Surg.  Status is: Inpatient Remains inpatient appropriate because: Newly diagnosed DM-1, aspiration pneumonia and ileus   Final disposition: SNF Consultants:  Neurology-signed off  Sch Meds:  Scheduled Meds:  atorvastatin  20 mg Oral Daily   insulin aspart  0-9 Units Subcutaneous Q4H   insulin glargine-yfgn  20 Units Subcutaneous BID   insulin starter kit- pen needles  1 kit Other Once   simethicone  80 mg Oral QID   Continuous Infusions:  sodium chloride 10 mL/hr at 04/13/22 0540   ampicillin-sulbactam (UNASYN) IV 3 g (04/14/22 0511)   PRN Meds:.sodium chloride, acetaminophen **OR** acetaminophen, dextrose, nicotine, ondansetron (ZOFRAN) IV, polyethylene glycol, senna-docusate  Antimicrobials: Anti-infectives (From admission, onward)    Start     Dose/Rate Route Frequency Ordered Stop   04/09/22 1100  Ampicillin-Sulbactam (UNASYN) 3 g in sodium chloride 0.9 % 100 mL IVPB        3 g 200 mL/hr over 30 Minutes Intravenous Every 8 hours 04/09/22 1045          I have personally reviewed the following labs and images: CBC: Recent Labs  Lab 04/09/22 0119 04/09/22 0121 04/09/22 0406 04/09/22 1031 04/11/22 0713 04/12/22 0312 04/13/22 0506  WBC 12.8*  --  12.5*  --  5.6 9.0 8.4  NEUTROABS 10.0*  --  9.8*  --   --   --   --   HGB 18.2*   < > 16.9 16.0 13.9 15.5 15.2  HCT 54.2*   < > 48.8 47.0 39.8 42.8 42.2  MCV 101.3*  --  98.8  --  97.8 95.3 95.9  PLT 249  --  250   --  146* 174 171   < > = values in this interval not displayed.   BMP &GFR Recent Labs  Lab 04/09/22 0515 04/09/22 8546 04/10/22 0223 04/11/22 0713 04/12/22 0312 04/13/22 0506 04/14/22 0949  NA 139   < > 148* 145 142 141 142  K 4.7   < > 3.8 3.4* 3.1* 3.4* 4.0  CL 112*   < > 117* 109 108 104 108  CO2 11*   < > '22 26 25 27 26  ' GLUCOSE 429*   < > 141* 201* 236* 167* 162*  BUN 32*   < > '21 14 15 14 16  ' CREATININE 2.36*   < > 1.18 1.04 0.97 0.91 0.91  CALCIUM 9.1   < > 9.4 9.4 10.1 9.6 9.7  MG 2.9*  --   --  2.2 2.2 2.1  --   PHOS 3.2  --   --  3.8 4.7* 4.0 4.1   < > =  values in this interval not displayed.   Estimated Creatinine Clearance: 103.6 mL/min (by C-G formula based on SCr of 0.91 mg/dL). Liver & Pancreas: Recent Labs  Lab 04/09/22 0119 04/09/22 0515 04/11/22 0713 04/12/22 0312 04/13/22 0506 04/14/22 0949  AST 18 16  --   --   --   --   ALT 25 21  --   --   --   --   ALKPHOS 91 76  --   --   --   --   BILITOT 2.2* 2.0*  --   --   --   --   PROT 9.2* 7.7  --   --   --   --   ALBUMIN 4.8 4.0 3.1* 3.3* 3.2* 3.1*   No results for input(s): "LIPASE", "AMYLASE" in the last 168 hours. Recent Labs  Lab 04/09/22 0515  AMMONIA 32   Diabetic: No results for input(s): "HGBA1C" in the last 72 hours.  Recent Labs  Lab 04/13/22 2044 04/14/22 0041 04/14/22 0354 04/14/22 0737 04/14/22 1312  GLUCAP 215* 183* 169* 157* 175*   Cardiac Enzymes: No results for input(s): "CKTOTAL", "CKMB", "CKMBINDEX", "TROPONINI" in the last 168 hours. No results for input(s): "PROBNP" in the last 8760 hours. Coagulation Profile: Recent Labs  Lab 04/09/22 0119  INR 1.2   Thyroid Function Tests: No results for input(s): "TSH", "T4TOTAL", "FREET4", "T3FREE", "THYROIDAB" in the last 72 hours.  Lipid Profile: Recent Labs    04/12/22 0312  CHOL 235*  HDL 42  LDLCALC 151*  TRIG 208*  CHOLHDL 5.6   Anemia Panel: No results for input(s): "VITAMINB12", "FOLATE", "FERRITIN",  "TIBC", "IRON", "RETICCTPCT" in the last 72 hours. Urine analysis:    Component Value Date/Time   COLORURINE YELLOW 04/09/2022 0250   APPEARANCEUR CLEAR 04/09/2022 0250   LABSPEC 1.027 04/09/2022 0250   PHURINE 5.0 04/09/2022 0250   GLUCOSEU >=500 (A) 04/09/2022 0250   HGBUR SMALL (A) 04/09/2022 0250   BILIRUBINUR NEGATIVE 04/09/2022 0250   KETONESUR 80 (A) 04/09/2022 0250   PROTEINUR 100 (A) 04/09/2022 0250   UROBILINOGEN 0.2 12/08/2009 2250   NITRITE NEGATIVE 04/09/2022 0250   LEUKOCYTESUR NEGATIVE 04/09/2022 0250   Sepsis Labs: Invalid input(s): "PROCALCITONIN", "LACTICIDVEN"  Microbiology: Recent Results (from the past 240 hour(s))  Resp Panel by RT-PCR (Flu A&B, Covid) Anterior Nasal Swab     Status: None   Collection Time: 04/09/22  2:53 AM   Specimen: Anterior Nasal Swab  Result Value Ref Range Status   SARS Coronavirus 2 by RT PCR NEGATIVE NEGATIVE Final    Comment: (NOTE) SARS-CoV-2 target nucleic acids are NOT DETECTED.  The SARS-CoV-2 RNA is generally detectable in upper respiratory specimens during the acute phase of infection. The lowest concentration of SARS-CoV-2 viral copies this assay can detect is 138 copies/mL. A negative result does not preclude SARS-Cov-2 infection and should not be used as the sole basis for treatment or other patient management decisions. A negative result may occur with  improper specimen collection/handling, submission of specimen other than nasopharyngeal swab, presence of viral mutation(s) within the areas targeted by this assay, and inadequate number of viral copies(<138 copies/mL). A negative result must be combined with clinical observations, patient history, and epidemiological information. The expected result is Negative.  Fact Sheet for Patients:  EntrepreneurPulse.com.au  Fact Sheet for Healthcare Providers:  IncredibleEmployment.be  This test is no t yet approved or cleared by the  [place] FDA and  has been authorized for detection and/or diagnosis of SARS-CoV-2  by FDA under an Emergency Use Authorization (EUA). This EUA will remain  in effect (meaning this test can be used) for the duration of the COVID-19 declaration under Section 564(b)(1) of the Act, 21 U.S.C.section 360bbb-3(b)(1), unless the authorization is terminated  or revoked sooner.       Influenza A by PCR NEGATIVE NEGATIVE Final   Influenza B by PCR NEGATIVE NEGATIVE Final    Comment: (NOTE) The Xpert Xpress SARS-CoV-2/FLU/RSV plus assay is intended as an aid in the diagnosis of influenza from Nasopharyngeal swab specimens and should not be used as a sole basis for treatment. Nasal washings and aspirates are unacceptable for Xpert Xpress SARS-CoV-2/FLU/RSV testing.  Fact Sheet for Patients: EntrepreneurPulse.com.au  Fact Sheet for Healthcare Providers: IncredibleEmployment.be  This test is not yet approved or cleared by the [place] FDA and has been authorized for detection and/or diagnosis of SARS-CoV-2 by FDA under an Emergency Use Authorization (EUA). This EUA will remain in effect (meaning this test can be used) for the duration of the COVID-19 declaration under Section 564(b)(1) of the Act, 21 U.S.C. section 360bbb-3(b)(1), unless the authorization is terminated or revoked.  Performed at [facility], [place] [address], [place], [place] [address]   Culture, blood (Routine X 2) w Reflex to ID Panel     Status: None   Collection Time: 04/09/22 10:29 AM   Specimen: BLOOD  Result Value Ref Range Status   Specimen Description BLOOD SITE NOT SPECIFIED  Final   Special Requests   Final    BOTTLES DRAWN AEROBIC AND ANAEROBIC Blood Culture adequate volume   Culture   Final    NO GROWTH 5 DAYS Performed at [facility], [address], [place], [place] [address]    Report Status 04/14/2022 FINAL  Final  Culture, blood (Routine X 2) w  Reflex to ID Panel     Status: None   Collection Time: 04/09/22 10:38 PM   Specimen: BLOOD RIGHT HAND  Result Value Ref Range Status   Specimen Description BLOOD RIGHT HAND  Final   Special Requests   Final    BOTTLES DRAWN AEROBIC AND ANAEROBIC Blood Culture adequate volume   Culture   Final    NO GROWTH 5 DAYS Performed at [facility], [place] [address], [place],  [address]    Report Status 04/14/2022 FINAL  Final    Radiology Studies: No results found.    Shalona Harbour T. [place]  If 7PM-7AM, please contact night-coverage [email or web address] 04/14/2022, 1:46 PM

## 2022-04-14 NOTE — Progress Notes (Signed)
CT abdomen and pelvis raises concern for developing or partial SBO.  Make patient n.p.o.  Changed SSI to sensitive every 4 hours while NPO.  Started IV fluid.  General surgery consulted and recommended NG tube.  General surgery to see patient.

## 2022-04-15 ENCOUNTER — Inpatient Hospital Stay (HOSPITAL_COMMUNITY): Payer: Medicaid Other

## 2022-04-15 DIAGNOSIS — D72825 Bandemia: Secondary | ICD-10-CM

## 2022-04-15 DIAGNOSIS — E1010 Type 1 diabetes mellitus with ketoacidosis without coma: Secondary | ICD-10-CM | POA: Diagnosis not present

## 2022-04-15 DIAGNOSIS — R000 Tachycardia, unspecified: Secondary | ICD-10-CM

## 2022-04-15 DIAGNOSIS — E109 Type 1 diabetes mellitus without complications: Secondary | ICD-10-CM | POA: Diagnosis not present

## 2022-04-15 DIAGNOSIS — N179 Acute kidney failure, unspecified: Secondary | ICD-10-CM | POA: Diagnosis not present

## 2022-04-15 DIAGNOSIS — G9341 Metabolic encephalopathy: Secondary | ICD-10-CM | POA: Diagnosis not present

## 2022-04-15 LAB — COMPREHENSIVE METABOLIC PANEL
ALT: 27 U/L (ref 0–44)
AST: 29 U/L (ref 15–41)
Albumin: 3.3 g/dL — ABNORMAL LOW (ref 3.5–5.0)
Alkaline Phosphatase: 73 U/L (ref 38–126)
Anion gap: 11 (ref 5–15)
BUN: 23 mg/dL (ref 8–23)
CO2: 28 mmol/L (ref 22–32)
Calcium: 10.6 mg/dL — ABNORMAL HIGH (ref 8.9–10.3)
Chloride: 100 mmol/L (ref 98–111)
Creatinine, Ser: 1.18 mg/dL (ref 0.61–1.24)
GFR, Estimated: >60 mL/min (ref >60)
Glucose, Bld: 183 mg/dL — ABNORMAL HIGH (ref 70–99)
Potassium: 3.7 mmol/L (ref 3.5–5.1)
Sodium: 139 mmol/L (ref 135–145)
Total Bilirubin: 1.2 mg/dL (ref 0.3–1.2)
Total Protein: 6.9 g/dL (ref 6.5–8.1)

## 2022-04-15 LAB — CBC
HCT: 45.8 % (ref 39.0–52.0)
Hemoglobin: 16.3 g/dL (ref 13.0–17.0)
MCH: 34.2 pg — ABNORMAL HIGH (ref 26.0–34.0)
MCHC: 35.6 g/dL (ref 30.0–36.0)
MCV: 96.2 fL (ref 80.0–100.0)
Platelets: 317 10*3/uL (ref 150–400)
RBC: 4.76 MIL/uL (ref 4.22–5.81)
RDW: 12.2 % (ref 11.5–15.5)
WBC: 17 10*3/uL — ABNORMAL HIGH (ref 4.0–10.5)
nRBC: 0 % (ref 0.0–0.2)

## 2022-04-15 LAB — PHOSPHORUS: Phosphorus: 5.4 mg/dL — ABNORMAL HIGH (ref 2.5–4.6)

## 2022-04-15 LAB — GLUCOSE, CAPILLARY
Glucose-Capillary: 167 mg/dL — ABNORMAL HIGH (ref 70–99)
Glucose-Capillary: 192 mg/dL — ABNORMAL HIGH (ref 70–99)
Glucose-Capillary: 193 mg/dL — ABNORMAL HIGH (ref 70–99)
Glucose-Capillary: 205 mg/dL — ABNORMAL HIGH (ref 70–99)
Glucose-Capillary: 206 mg/dL — ABNORMAL HIGH (ref 70–99)
Glucose-Capillary: 221 mg/dL — ABNORMAL HIGH (ref 70–99)
Glucose-Capillary: 231 mg/dL — ABNORMAL HIGH (ref 70–99)

## 2022-04-15 LAB — MAGNESIUM: Magnesium: 2.2 mg/dL (ref 1.7–2.4)

## 2022-04-15 MED ORDER — LACTATED RINGERS IV SOLN: INTRAVENOUS | Status: DC

## 2022-04-15 MED ORDER — DIATRIZOATE MEGLUMINE & SODIUM 66-10 % PO SOLN
90.0000 mL | Freq: Once | ORAL | Status: AC
Start: 2022-04-15 — End: 2022-04-15
  Administered 2022-04-15: 90 mL via NASOGASTRIC
  Filled 2022-04-15: qty 90

## 2022-04-15 MED ORDER — METOPROLOL TARTRATE 5 MG/5ML IV SOLN
2.5000 mg | Freq: Once | INTRAVENOUS | Status: DC | PRN
Start: 2022-04-15 — End: 2022-04-15

## 2022-04-15 MED ORDER — SODIUM CHLORIDE 0.9 % IV BOLUS
500.0000 mL | Freq: Once | INTRAVENOUS | Status: AC
Start: 2022-04-15 — End: 2022-04-15
  Administered 2022-04-15: 500 mL via INTRAVENOUS

## 2022-04-15 MED ORDER — SODIUM CHLORIDE 0.9 % IV BOLUS
500.0000 mL | Freq: Once | INTRAVENOUS | Status: AC
  Administered 2022-04-15: 500 mL via INTRAVENOUS

## 2022-04-15 MED ORDER — PANTOPRAZOLE SODIUM 40 MG IV SOLR
40.0000 mg | INTRAVENOUS | Status: DC
  Administered 2022-04-15 – 2022-04-28 (×14): 40 mg via INTRAVENOUS
  Filled 2022-04-15 (×14): qty 10

## 2022-04-15 MED ORDER — METOPROLOL TARTRATE 5 MG/5ML IV SOLN
5.0000 mg | Freq: Once | INTRAVENOUS | Status: AC | PRN
  Administered 2022-04-24: 5 mg via INTRAVENOUS
  Filled 2022-04-15: qty 5

## 2022-04-15 NOTE — Progress Notes (Signed)
   04/15/22 0003  Assess: MEWS Score  Temp 98 F (36.7 C)  BP 108/76  MAP (mmHg) 87  Pulse Rate (!) 131  Resp 19  Level of Consciousness Alert  SpO2 93 %  O2 Device Nasal Cannula  Patient Activity (if Appropriate) In bed  O2 Flow Rate (L/min) 2 L/min  Assess: MEWS Score  MEWS Temp 0  MEWS Systolic 0  MEWS Pulse 3  MEWS RR 0  MEWS LOC 0  MEWS Score 3  MEWS Score Color Yellow  Assess: if the MEWS score is Yellow or Red  Were vital signs taken at a resting state? Yes  Focused Assessment No change from prior assessment  Does the patient meet 2 or more of the SIRS criteria? No  MEWS guidelines implemented *See Row Information* Yes  Treat  Pain Scale 0-10  Pain Score 0  Take Vital Signs  Increase Vital Sign Frequency  Yellow: Q 2hr X 2 then Q 4hr X 2, if remains yellow, continue Q 4hrs  Escalate  MEWS: Escalate Yellow: discuss with charge nurse/RN and consider discussing with provider and RRT  Notify: Charge Nurse/RN  Name of Charge Nurse/RN Notified Lawyer  Date Charge Nurse/RN Notified 04/15/22  Time Charge Nurse/RN Notified 0000  Notify: Provider  Provider Name/Title MD Monica Becton  Date Provider Notified 04/15/22  Time Provider Notified 0015  Method of Notification Page  Notification Reason  (HR consistently high 120-130's; patient not on telemetry)  Provider response See new orders  Assess: SIRS CRITERIA  SIRS Temperature  0  SIRS Pulse 1  SIRS Respirations  0  SIRS WBC 1  SIRS Score Sum  2

## 2022-04-15 NOTE — Progress Notes (Signed)
Mobility Specialist: Progress Note   04/15/22 1650  Mobility  Activity Ambulated with assistance in hallway  Level of Assistance Contact guard assist, steadying assist  Assistive Device Four wheel walker  Distance Ambulated (ft) 120 ft  Activity Response Tolerated well  $Mobility charge 1 Mobility   Pre-Mobility: 117 HR, 94% SpO2 Post-Mobility: 131 HR, 95% SpO2  Pt received in the bed and agreeable to mobility. Ambulated on 2 L/min [place]. No c/o throughout. Pt set up at the sink after session to be washed up with NT present in the room.  [facility] Zuma Hust Mobility Specialist Mobility Specialist [address]: [phone]

## 2022-04-15 NOTE — Progress Notes (Addendum)
Patient complaining of nausea from start of shift, had emesis, . Abdomen distended and tender to touch. Zofran IV given but no relief. NGT inserted and connected to low intermittent suction as per Dr's order. NGT was in for about 2 hours then patient pulled out tube saying it felt very uncomfortable on his nose. Able to drain total 2000 ml of greenish brown liquid while tube was in. Patient said he felt much better and less full on his abdomen after having the NG tube. Explained to pt that he has to have the tube in his nose to help with his abdominal discomfort and that its only temporary but pt refused to have another one put in. Dr informed.

## 2022-04-15 NOTE — Progress Notes (Signed)
[facility] Progress Note     Subjective: CC:  Pulled out his NG overnight after getting over 2L out. He denies pain. Reports flatus, no BM. Denies nausea or vomiting  Objective: Vital signs in last 24 hours: Temp:  [98 F (36.7 C)-98.7 F (37.1 C)] 98.6 F (37 C) (09/03 0718) Pulse Rate:  [121-134] 129 (09/03 0505) Resp:  [18-19] 18 (09/03 0718) BP: (108-134)/(76-94) 116/84 (09/03 0505) SpO2:  [91 %-95 %] 94 % (09/03 0505) Weight:  [104 kg] 104 kg (09/03 0450) Last BM Date : 04/13/22  Intake/Output from previous day: 09/02 0701 - 09/03 0700 In: 797.2 [I.V.:797.2] Out: 3300 [Urine:800; Emesis/NG output:2500] Intake/Output this shift: No intake/output data recorded.  PE: Gen:  Alert, NAD, cooperative Card:  sinus tach on monitor - 120's, no lower extremity edema Pulm:  Normal effort ORA Abd: Soft, protuberant, tympanic, nontender, scarring over umbilicus Skin: warm and dry, no rashes  Psych: A&Ox3   Lab Results:  Recent Labs    04/13/22 0506 04/15/22 0223  WBC 8.4 17.0*  HGB 15.2 16.3  HCT 42.2 45.8  PLT 171 317   BMET Recent Labs    04/14/22 0949 04/15/22 0223  NA 142 139  K 4.0 3.7  CL 108 100  CO2 26 28  GLUCOSE 162* 183*  BUN 16 23  CREATININE 0.91 1.18  CALCIUM 9.7 10.6*   PT/INR No results for input(s): "LABPROT", "INR" in the last 72 hours. CMP     Component Value Date/Time   NA 139 04/15/2022 0223   NA 142 11/15/2017 1646   K 3.7 04/15/2022 0223   CL 100 04/15/2022 0223   CO2 28 04/15/2022 0223   GLUCOSE 183 (H) 04/15/2022 0223   BUN 23 04/15/2022 0223   BUN 15 11/15/2017 1646   CREATININE 1.18 04/15/2022 0223   CREATININE 1.14 09/27/2016 1532   CALCIUM 10.6 (H) 04/15/2022 0223   PROT 6.9 04/15/2022 0223   PROT 7.3 10/07/2017 0954   ALBUMIN 3.3 (L) 04/15/2022 0223   ALBUMIN 4.4 10/07/2017 0954   AST 29 04/15/2022 0223   ALT 27 04/15/2022 0223   ALKPHOS 73 04/15/2022 0223   BILITOT 1.2 04/15/2022 0223   BILITOT 0.6  10/07/2017 0954   GFRNONAA >60 04/15/2022 0223   GFRNONAA 71 09/27/2016 1532   GFRAA >60 01/18/2018 0526   GFRAA 82 09/27/2016 1532   Lipase  No results found for: "LIPASE"     Studies/Results: DG Abd Portable 1V-Small Bowel Protocol-Position Verification  Result Date: 04/14/2022 CLINICAL DATA:  NG tube placement. EXAM: PORTABLE ABDOMEN - 1 VIEW COMPARISON:  CT earlier today FINDINGS: Tip and side port of the enteric tube below the diaphragm in the stomach. There are persistent dilated loops of small bowel in the central abdomen. IMPRESSION: 1. Tip and side port of the enteric tube below the diaphragm in the stomach. 2. Persistent dilated small bowel in the central abdomen. Electronically Signed   By: Narda Rutherford M.D.   On: 04/14/2022 23:46   CT ABDOMEN PELVIS W CONTRAST  Result Date: 04/14/2022 CLINICAL DATA:  Abdominal pain EXAM: CT ABDOMEN AND PELVIS WITH CONTRAST TECHNIQUE: Multidetector CT imaging of the abdomen and pelvis was performed using the standard protocol following bolus administration of intravenous contrast. RADIATION DOSE REDUCTION: This exam was performed according to the departmental dose-optimization program which includes automated exposure control, adjustment of the mA and/or kV according to patient size and/or use of iterative reconstruction technique. CONTRAST:  OMNIPAQUE IOHEXOL 300 MG/ML SOLN  additional oral enteric contrast COMPARISON:  10/18/2015 FINDINGS: Lower chest: Small bilateral pleural effusions and associated atelectasis or consolidation. Coronary artery calcifications. Hepatobiliary: No solid liver abnormality is seen. No gallstones, gallbladder wall thickening, or biliary dilatation. Pancreas: Unremarkable. No pancreatic ductal dilatation or surrounding inflammatory changes. Spleen: Normal in size without significant abnormality. Adrenals/Urinary Tract: Adrenal glands are unremarkable. Kidneys are normal, without renal calculi, solid lesion, or  hydronephrosis. Bladder is unremarkable. Stomach/Bowel: Stomach is within normal limits. The proximal small bowel is mildly distended by fluid and oral enteric contrast, largest loops measuring up to 3.9 cm. Although a specific transition point is difficult to identify, the terminal loops of ileum are sharply decompressed, and there is an abrupt caliber change of the distal ileum in the right hemiabdomen (in the vicinity of series 6, image 40, series 3, image 64). Moderate burden of stool throughout the colon. Gas and stool present to the rectum. Vascular/Lymphatic: Aortic atherosclerosis. No enlarged abdominal or pelvic lymph nodes. Reproductive: No mass or other significant abnormality. Other: Small, fat containing bilateral inguinal hernias. Small volume ascites. Musculoskeletal: No acute or significant osseous findings. IMPRESSION: 1. The proximal small bowel is mildly distended by fluid and oral enteric contrast, largest loops measuring up to 3.9 cm. Although a specific transition point is difficult to identify, the terminal loops of ileum are sharply decompressed, and there is an abrupt caliber change of the distal ileum in the right hemiabdomen. Findings are concerning for developing or partial small bowel obstruction. 2. Moderate burden of stool throughout the colon to the rectum. 3. Small volume ascites. 4. Small bilateral pleural effusions and associated atelectasis or consolidation. 5. Coronary artery disease. Aortic Atherosclerosis (ICD10-I70.0). Electronically Signed   By: Jearld Lesch M.D.   On: 04/14/2022 17:01    Anti-infectives: Anti-infectives (From admission, onward)    Start     Dose/Rate Route Frequency Ordered Stop   04/09/22 1100  Ampicillin-Sulbactam (UNASYN) 3 g in sodium chloride 0.9 % 100 mL IVPB  Status:  Discontinued        3 g 200 mL/hr over 30 Minutes Intravenous Every 8 hours 04/09/22 1045 04/14/22 1419        Assessment/Plan  Cody [name] is an 61 y.o. male with  GERD, newly diagnosed T1DM with now possible ileus vs sbo/psbo   -SBO protocol ordered 9/2. NG pulled out before he got the contrast. This needs to be replaced as he is still moderately distended and not having bowel function. Re-attempt SBO protocol today - CCS will follow     LOS: 6 days   I reviewed nursing notes, hospitalist notes, last 24 h vitals and pain scores, last 48 h intake and output, last 24 h labs and trends, and last 24 h imaging results.    Hosie Spangle, PA-C [facility] Please see Amion for pager number during day hours 7:00am-4:30pm

## 2022-04-15 NOTE — Progress Notes (Signed)
PROGRESS NOTE  Cody [name] OIN:[id number] DOB: [birth year]   PCP: Ladell Pier, MD  Patient is from: SNF  DOA: 04/09/2022 LOS: 6  Chief complaints Chief Complaint  Patient presents with   Code Stroke   Hyperglycemia     Brief Narrative / Interim history: 61 yo M with PMH of GERD, allergic rhinitis and tobacco use disorder presents with confusion found to have DKA with newly diagnosed diabetes, acute respiratory failure with hypoxia likely due to aspiration pneumonia/pneumonitis, AKI, encephalopathy and strokelike symptoms.  He was treated with IV insulin and IV fluid.  DKA resolved.  He was transitioned to subcu insulin.  C-peptide low suggesting type 1 diabetes.  CVA work-up negative.  EEG did not show seizure.  Encephalopathy and AKI resolved.  He is on IV Unasyn for possible aspiration pneumonia.  Respiratory failure improving.  Completed antibiotic course.  Slowly weaning of oxygen.  Patient developed abdominal pain, distention and emesis.  KUB concerning for ileus.  CT abdomen and pelvis raises concern for partial SBO.  General surgery consulted on 9/2.  SBP protocol initiated.  Subjective: Seen and examined earlier this morning.  Patient had NG tube placed and had 2.8 L.  He pulled out NG tube when he feels better.  He is agreeable to NG tube replacement for SBO protocol.  He denies pain.  Reports passing gas but no bowel movement.  Objective: Vitals:   04/15/22 0353 04/15/22 0450 04/15/22 0505 04/15/22 0718  BP: 122/85  116/84   Pulse: (!) 129  (!) 129   Resp: 18   18  Temp: 98.6 F (37 C)  98.3 F (36.8 C) 98.6 F (37 C)  TempSrc: Oral  Oral Oral  SpO2: 95%  94%   Weight:  104 kg    Height:        Examination:  GENERAL: No apparent distress.  Nontoxic. HEENT: MMM.  Vision and hearing grossly intact.  NECK: Supple.  No apparent JVD.  RESP:  No IWOB.  Fair aeration bilaterally. CVS:  RRR. Heart sounds normal.  ABD/GI/GU: Negative BS.  Abdomen distended but  less firm.  No tenderness. MSK/EXT:  Moves extremities. No apparent deformity. No edema.  SKIN: no apparent skin lesion or wound NEURO: Awake and alert. Oriented appropriately.  No apparent focal neuro deficit. PSYCH: Calm. Normal affect.   Procedures:  None  Microbiology summarized: COVID-19 PCR nonreactive. Blood cultures NGTD  Assessment and plan: Principal Problem:   DKA (diabetic ketoacidosis) ([place]) Active Problems:   GERD (gastroesophageal reflux disease)   Acute metabolic encephalopathy   Severe sepsis ([facility])   AKI (acute kidney injury) ([facility])   Dehydration   SIRS (systemic inflammatory response syndrome) ([facility])   Allergic rhinitis   Ileus ([facility])  Ileus/possible SBO: Significant distention with intermittent emesis.  CT raises concern for partial SBO/ileus. -General surgery on board and started SBO protocol on 9/2 but patient pulled out NG tube when he felt better. -Plan to replace NG tube and restart SBO protocol -Continue IVF -Add IV PPI  DM-1 with DKA, hyperglycemia and hyperlipidemia  A1c 11.7%.  C-peptide 0.6.  LDL 151 DKA resolved. Recent Labs  Lab 04/14/22 1946 04/15/22 0058 04/15/22 0401 04/15/22 0838 04/15/22 1320  GLUCAP 201* 231* 167* 192* 205*  -Continue SSI-sensitive every 4 hours while n.p.o. -Further adjustment as appropriate.   Acute kidney injury: In the setting of DKA.  Resolved. Recent Labs    04/09/22 0515 04/09/22 7096 04/09/22 1108 04/09/22 1658 04/10/22 0223 04/11/22 2836 04/12/22  5909 04/13/22 0506 04/14/22 0949 04/15/22 0223  BUN 32* 29* 30* 25* '21 14 15 14 16 23  ' CREATININE 2.36* 2.01* 1.86* 1.49* 1.18 1.04 0.97 0.91 0.91 1.18  -Monitor as needed   Acute metabolic encephalopathy/strokelike symptoms: CVA work-up unrevealing.  EEG unremarkable.  Encephalopathy resolved.  He is oriented x4. -Reorientation and delirium precautions   Severe sepsis with acute hypoxic respiratory failure due to aspiration pneumonia: POA.  Required  up to 10 L at some point.  CT chest with patchy infiltrate.  Likely in the setting of encephalopathy.  TTE with normal LVEF and G1 DD.  Respiratory failure improving.  Procalcitonin and sepsis physiology resolved. -Continue IV Unasyn for total of 5 days -Wean off oxygen as able -Encourage incentive spirometry  Sinus tachycardia: HR in 120s.  Now symptomatic. -Increase IV metoprolol to 5 mg as needed HR > 120. -IV fluids  GERD: -Continue PPI   Mild hypernatremia: Resolved. -Monitor  Hypokalemia: Resolved. -Replace and recheck  Tobacco use disorder -Encourage cessation -Continue nicotine patch  Generalized weakness/physical deconditioning -PT/OT recommended return to SNF for further therapy  Leukocytosis/bandemia: Likely demargination.  He just finished 6 days of IV Unasyn for possible aspiration pneumonia. -Continue monitoring  Obesity Body mass index is 31.98 kg/m.         DVT prophylaxis:  SCDs Start: 04/09/22 3112  Code Status: Full code Family Communication: None at bedside Level of care: Med-Surg.  Status is: Inpatient Remains inpatient appropriate because: Newly diagnosed DM-1, aspiration pneumonia and ileus   Final disposition: SNF Consultants:  Neurology-signed off  Sch Meds:  Scheduled Meds:  atorvastatin  20 mg Oral Daily   bisacodyl  10 mg Rectal Daily   insulin aspart  0-9 Units Subcutaneous Q4H   insulin starter kit- pen needles  1 kit Other Once   Continuous Infusions:  sodium chloride 10 mL/hr at 04/13/22 0540   PRN Meds:.sodium chloride, acetaminophen **OR** acetaminophen, dextrose, metoprolol tartrate, nicotine, ondansetron (ZOFRAN) IV  Antimicrobials: Anti-infectives (From admission, onward)    Start     Dose/Rate Route Frequency Ordered Stop   04/09/22 1100  Ampicillin-Sulbactam (UNASYN) 3 g in sodium chloride 0.9 % 100 mL IVPB  Status:  Discontinued        3 g 200 mL/hr over 30 Minutes Intravenous Every 8 hours 04/09/22 1045  04/14/22 1419        I have personally reviewed the following labs and images: CBC: Recent Labs  Lab 04/09/22 0119 04/09/22 0121 04/09/22 0406 04/09/22 1031 04/11/22 0713 04/12/22 0312 04/13/22 0506 04/15/22 0223  WBC 12.8*  --  12.5*  --  5.6 9.0 8.4 17.0*  NEUTROABS 10.0*  --  9.8*  --   --   --   --   --   HGB 18.2*   < > 16.9 16.0 13.9 15.5 15.2 16.3  HCT 54.2*   < > 48.8 47.0 39.8 42.8 42.2 45.8  MCV 101.3*  --  98.8  --  97.8 95.3 95.9 96.2  PLT 249  --  250  --  146* 174 171 317   < > = values in this interval not displayed.   BMP &GFR Recent Labs  Lab 04/09/22 0515 04/09/22 1624 04/11/22 0713 04/12/22 0312 04/13/22 0506 04/14/22 0949 04/15/22 0223  NA 139   < > 145 142 141 142 139  K 4.7   < > 3.4* 3.1* 3.4* 4.0 3.7  CL 112*   < > 109 108 104 108 100  CO2 11*   < >  '26 25 27 26 28  ' GLUCOSE 429*   < > 201* 236* 167* 162* 183*  BUN 32*   < > '14 15 14 16 23  ' CREATININE 2.36*   < > 1.04 0.97 0.91 0.91 1.18  CALCIUM 9.1   < > 9.4 10.1 9.6 9.7 10.6*  MG 2.9*  --  2.2 2.2 2.1  --  2.2  PHOS 3.2  --  3.8 4.7* 4.0 4.1 5.4*   < > = values in this interval not displayed.   Estimated Creatinine Clearance: 79.7 mL/min (by C-G formula based on SCr of 1.18 mg/dL). Liver & Pancreas: Recent Labs  Lab 04/09/22 0119 04/09/22 0515 04/11/22 0713 04/12/22 0312 04/13/22 0506 04/14/22 0949 04/15/22 0223  AST 18 16  --   --   --   --  29  ALT 25 21  --   --   --   --  27  ALKPHOS 91 76  --   --   --   --  73  BILITOT 2.2* 2.0*  --   --   --   --  1.2  PROT 9.2* 7.7  --   --   --   --  6.9  ALBUMIN 4.8 4.0 3.1* 3.3* 3.2* 3.1* 3.3*   No results for input(s): "LIPASE", "AMYLASE" in the last 168 hours. Recent Labs  Lab 04/09/22 0515  AMMONIA 32   Diabetic: No results for input(s): "HGBA1C" in the last 72 hours.  Recent Labs  Lab 04/14/22 1946 04/15/22 0058 04/15/22 0401 04/15/22 0838 04/15/22 1320  GLUCAP 201* 231* 167* 192* 205*   Cardiac Enzymes: No  results for input(s): "CKTOTAL", "CKMB", "CKMBINDEX", "TROPONINI" in the last 168 hours. No results for input(s): "PROBNP" in the last 8760 hours. Coagulation Profile: Recent Labs  Lab 04/09/22 0119  INR 1.2   Thyroid Function Tests: No results for input(s): "TSH", "T4TOTAL", "FREET4", "T3FREE", "THYROIDAB" in the last 72 hours.  Lipid Profile: No results for input(s): "CHOL", "HDL", "LDLCALC", "TRIG", "CHOLHDL", "LDLDIRECT" in the last 72 hours.  Anemia Panel: No results for input(s): "VITAMINB12", "FOLATE", "FERRITIN", "TIBC", "IRON", "RETICCTPCT" in the last 72 hours. Urine analysis:    Component Value Date/Time   COLORURINE YELLOW 04/09/2022 0250   APPEARANCEUR CLEAR 04/09/2022 0250   LABSPEC 1.027 04/09/2022 0250   PHURINE 5.0 04/09/2022 0250   GLUCOSEU >=500 (A) 04/09/2022 0250   HGBUR SMALL (A) 04/09/2022 0250   BILIRUBINUR NEGATIVE 04/09/2022 0250   KETONESUR 80 (A) 04/09/2022 0250   PROTEINUR 100 (A) 04/09/2022 0250   UROBILINOGEN 0.2 12/08/2009 2250   NITRITE NEGATIVE 04/09/2022 0250   LEUKOCYTESUR NEGATIVE 04/09/2022 0250   Sepsis Labs: Invalid input(s): "PROCALCITONIN", "LACTICIDVEN"  Microbiology: Recent Results (from the past 240 hour(s))  Resp Panel by RT-PCR (Flu A&B, Covid) Anterior Nasal Swab     Status: None   Collection Time: 04/09/22  2:53 AM   Specimen: Anterior Nasal Swab  Result Value Ref Range Status   SARS Coronavirus 2 by RT PCR NEGATIVE NEGATIVE Final    Comment: (NOTE) SARS-CoV-2 target nucleic acids are NOT DETECTED.  The SARS-CoV-2 RNA is generally detectable in upper respiratory specimens during the acute phase of infection. The lowest concentration of SARS-CoV-2 viral copies this assay can detect is 138 copies/mL. A negative result does not preclude SARS-Cov-2 infection and should not be used as the sole basis for treatment or other patient management decisions. A negative result may occur with  improper specimen collection/handling,  submission of specimen  other than nasopharyngeal swab, presence of viral mutation(s) within the areas targeted by this assay, and inadequate number of viral copies(<138 copies/mL). A negative result must be combined with clinical observations, patient history, and epidemiological information. The expected result is Negative.  Fact Sheet for Patients:  EntrepreneurPulse.com.au  Fact Sheet for Healthcare Providers:  IncredibleEmployment.be  This test is no t yet approved or cleared by the [place] FDA and  has been authorized for detection and/or diagnosis of SARS-CoV-2 by FDA under an Emergency Use Authorization (EUA). This EUA will remain  in effect (meaning this test can be used) for the duration of the COVID-19 declaration under Section 564(b)(1) of the Act, 21 U.S.C.section 360bbb-3(b)(1), unless the authorization is terminated  or revoked sooner.       Influenza A by PCR NEGATIVE NEGATIVE Final   Influenza B by PCR NEGATIVE NEGATIVE Final    Comment: (NOTE) The Xpert Xpress SARS-CoV-2/FLU/RSV plus assay is intended as an aid in the diagnosis of influenza from Nasopharyngeal swab specimens and should not be used as a sole basis for treatment. Nasal washings and aspirates are unacceptable for Xpert Xpress SARS-CoV-2/FLU/RSV testing.  Fact Sheet for Patients: EntrepreneurPulse.com.au  Fact Sheet for Healthcare Providers: IncredibleEmployment.be  This test is not yet approved or cleared by the [place] FDA and has been authorized for detection and/or diagnosis of SARS-CoV-2 by FDA under an Emergency Use Authorization (EUA). This EUA will remain in effect (meaning this test can be used) for the duration of the COVID-19 declaration under Section 564(b)(1) of the Act, 21 U.S.C. section 360bbb-3(b)(1), unless the authorization is terminated or revoked.  Performed at [facility], [place] [address], [place], [place] [address]   Culture, blood (Routine X 2) w Reflex to ID Panel     Status: None   Collection Time: 04/09/22 10:29 AM   Specimen: BLOOD  Result Value Ref Range Status   Specimen Description BLOOD SITE NOT SPECIFIED  Final   Special Requests   Final    BOTTLES DRAWN AEROBIC AND ANAEROBIC Blood Culture adequate volume   Culture   Final    NO GROWTH 5 DAYS Performed at [facility], [address], [place], [place] [address]    Report Status 04/14/2022 FINAL  Final  Culture, blood (Routine X 2) w Reflex to ID Panel     Status: None   Collection Time: 04/09/22 10:38 PM   Specimen: BLOOD RIGHT HAND  Result Value Ref Range Status   Specimen Description BLOOD RIGHT HAND  Final   Special Requests   Final    BOTTLES DRAWN AEROBIC AND ANAEROBIC Blood Culture adequate volume   Culture   Final    NO GROWTH 5 DAYS Performed at [facility], [place] [address] [place], [place] [address]    Report Status 04/14/2022 FINAL  Final    Radiology Studies: DG Abd Portable 1V-Small Bowel Protocol-Position Verification  Result Date: 04/14/2022 CLINICAL DATA:  NG tube placement. EXAM: PORTABLE ABDOMEN - 1 VIEW COMPARISON:  CT earlier today FINDINGS: Tip and side port of the enteric tube below the diaphragm in the stomach. There are persistent dilated loops of small bowel in the central abdomen. IMPRESSION: 1. Tip and side port of the enteric tube below the diaphragm in the stomach. 2. Persistent dilated small bowel in the central abdomen. Electronically Signed   By: Keith Rake M.D.   On: 04/14/2022 23:46      Bayan Hedstrom T. [place]  If 7PM-7AM, please contact night-coverage [email or web address] 04/15/2022, 3:52 PM

## 2022-04-16 ENCOUNTER — Inpatient Hospital Stay (HOSPITAL_COMMUNITY): Payer: Medicaid Other

## 2022-04-16 DIAGNOSIS — E1010 Type 1 diabetes mellitus with ketoacidosis without coma: Secondary | ICD-10-CM | POA: Diagnosis not present

## 2022-04-16 DIAGNOSIS — N179 Acute kidney failure, unspecified: Secondary | ICD-10-CM | POA: Diagnosis not present

## 2022-04-16 DIAGNOSIS — G9341 Metabolic encephalopathy: Secondary | ICD-10-CM | POA: Diagnosis not present

## 2022-04-16 DIAGNOSIS — E109 Type 1 diabetes mellitus without complications: Secondary | ICD-10-CM | POA: Diagnosis not present

## 2022-04-16 LAB — RENAL FUNCTION PANEL
Albumin: 3.3 g/dL — ABNORMAL LOW (ref 3.5–5.0)
Anion gap: 18 — ABNORMAL HIGH (ref 5–15)
BUN: 23 mg/dL (ref 8–23)
CO2: 26 mmol/L (ref 22–32)
Calcium: 11 mg/dL — ABNORMAL HIGH (ref 8.9–10.3)
Chloride: 97 mmol/L — ABNORMAL LOW (ref 98–111)
Creatinine, Ser: 1.14 mg/dL (ref 0.61–1.24)
GFR, Estimated: >60 mL/min (ref >60)
Glucose, Bld: 174 mg/dL — ABNORMAL HIGH (ref 70–99)
Phosphorus: 5.5 mg/dL — ABNORMAL HIGH (ref 2.5–4.6)
Potassium: 4.3 mmol/L (ref 3.5–5.1)
Sodium: 141 mmol/L (ref 135–145)

## 2022-04-16 LAB — GLUCOSE, CAPILLARY
Glucose-Capillary: 124 mg/dL — ABNORMAL HIGH (ref 70–99)
Glucose-Capillary: 129 mg/dL — ABNORMAL HIGH (ref 70–99)
Glucose-Capillary: 146 mg/dL — ABNORMAL HIGH (ref 70–99)
Glucose-Capillary: 155 mg/dL — ABNORMAL HIGH (ref 70–99)
Glucose-Capillary: 158 mg/dL — ABNORMAL HIGH (ref 70–99)
Glucose-Capillary: 171 mg/dL — ABNORMAL HIGH (ref 70–99)
Glucose-Capillary: 194 mg/dL — ABNORMAL HIGH (ref 70–99)

## 2022-04-16 LAB — CBC WITH DIFFERENTIAL/PLATELET
Abs Immature Granulocytes: 0.05 10*3/uL (ref 0.00–0.07)
Basophils Absolute: 0.1 10*3/uL (ref 0.0–0.1)
Basophils Relative: 1 %
Eosinophils Absolute: 0.1 10*3/uL (ref 0.0–0.5)
Eosinophils Relative: 1 %
HCT: 41.3 % (ref 39.0–52.0)
Hemoglobin: 14.1 g/dL (ref 13.0–17.0)
Immature Granulocytes: 1 %
Lymphocytes Relative: 27 %
Lymphs Abs: 2.9 10*3/uL (ref 0.7–4.0)
MCH: 33.9 pg (ref 26.0–34.0)
MCHC: 34.1 g/dL (ref 30.0–36.0)
MCV: 99.3 fL (ref 80.0–100.0)
Monocytes Absolute: 1.3 10*3/uL — ABNORMAL HIGH (ref 0.1–1.0)
Monocytes Relative: 12 %
Neutro Abs: 6.2 10*3/uL (ref 1.7–7.7)
Neutrophils Relative %: 58 %
Platelets: 322 10*3/uL (ref 150–400)
RBC: 4.16 MIL/uL — ABNORMAL LOW (ref 4.22–5.81)
RDW: 12.3 % (ref 11.5–15.5)
WBC: 10.6 10*3/uL — ABNORMAL HIGH (ref 4.0–10.5)
nRBC: 0 % (ref 0.0–0.2)

## 2022-04-16 LAB — MAGNESIUM: Magnesium: 2.3 mg/dL (ref 1.7–2.4)

## 2022-04-16 NOTE — Progress Notes (Signed)
PROGRESS NOTE  Cody [name] KBT:[id number] DOB: 1961/05/08   PCP: Ladell Pier, MD  Patient is from: SNF  DOA: 04/09/2022 LOS: 7  Chief complaints Chief Complaint  Patient presents with   Code Stroke   Hyperglycemia     Brief Narrative / Interim history: 61 yo M with PMH of GERD, allergic rhinitis and tobacco use disorder presents with confusion found to have DKA with newly diagnosed diabetes, acute respiratory failure with hypoxia likely due to aspiration pneumonia/pneumonitis, AKI, encephalopathy and strokelike symptoms.  He was treated with IV insulin and IV fluid.  DKA resolved.  He was transitioned to subcu insulin.  C-peptide low suggesting type 1 diabetes.  CVA work-up negative.  EEG did not show seizure.  Encephalopathy and AKI resolved.  He is on IV Unasyn for possible aspiration pneumonia.  Respiratory failure improving.  Completed antibiotic course.  Slowly weaning of oxygen.  Patient developed abdominal pain, distention and emesis.  KUB concerning for ileus.  CT abdomen and pelvis raises concern for partial SBO.  General surgery consulted on 9/2.  SBP protocol initiated.  Subjective: Seen and examined earlier this morning.  No major events overnight of this morning.  No complaints.  He denies nausea, vomiting or abdominal pain.  Passing gas.  He also reports liquid stool last night. Objective: Vitals:   04/16/22 0416 04/16/22 0825 04/16/22 0826 04/16/22 1029  BP: 120/77  105/71 124/87  Pulse: (!) 113  (!) 108 (!) 107  Resp: '17  18 18  ' Temp: 98 F (36.7 C) 98.7 F (37.1 C) 98.7 F (37.1 C) 98 F (36.7 C)  TempSrc: Oral Oral Oral Oral  SpO2: 100% 100% 100%   Weight:      Height:        Examination:  GENERAL: No apparent distress.  Nontoxic. HEENT: MMM.  Vision and hearing grossly intact.  NECK: Supple.  No apparent JVD.  RESP:  No IWOB.  Fair aeration bilaterally. CVS:  RRR. Heart sounds normal.  ABD/GI/GU: BS--.  Abdomen distended but  nontender. MSK/EXT:  Moves extremities. No apparent deformity. No edema.  SKIN: no apparent skin lesion or wound NEURO: Awake and alert. Oriented appropriately.  No apparent focal neuro deficit. PSYCH: Flat affect.  Procedures:  None  Microbiology summarized: COVID-19 PCR nonreactive. Blood cultures NGTD  Assessment and plan: Principal Problem:   DKA (diabetic ketoacidosis) ([place]) Active Problems:   GERD (gastroesophageal reflux disease)   Acute metabolic encephalopathy   Severe sepsis (HCC)   AKI (acute kidney injury) (HCC)   Dehydration   SIRS (systemic inflammatory response syndrome) (HCC)   Allergic rhinitis   Ileus (HCC)  Ileus/possible SBO: Significant distention with intermittent emesis.  CT raises concern for partial SBO/ileus. -General surgery on board.  SBO protocol underway. -Mobilize patient -NPO.  Continue IVF -Continue IV Protonix.  DM-1 with DKA, hyperglycemia and hyperlipidemia  A1c 11.7%.  C-peptide 0.6.  LDL 151 DKA resolved. Recent Labs  Lab 04/15/22 2013 04/16/22 0003 04/16/22 0413 04/16/22 0911 04/16/22 1126  GLUCAP 206* 194* 171* 155* 146*  -Continue SSI-sensitive every 4 hours while n.p.o. -Further adjustment as appropriate.   Acute kidney injury: In the setting of DKA.  Resolved. Recent Labs    04/09/22 0938 04/09/22 1108 04/09/22 1658 04/10/22 0223 04/11/22 0713 04/12/22 0312 04/13/22 0506 04/14/22 0949 04/15/22 0223 04/16/22 0200  BUN 29* 30* 25* '21 14 15 14 16 23 23  ' CREATININE 2.01* 1.86* 1.49* 1.18 1.04 0.97 0.91 0.91 1.18 1.14  -Monitor as needed  Acute metabolic encephalopathy/strokelike symptoms: CVA work-up unrevealing.  EEG unremarkable.  Encephalopathy resolved.  He is oriented x4. -Reorientation and delirium precautions   Severe sepsis with acute hypoxic respiratory failure due to aspiration pneumonia: POA.  Required up to 10 L at some point.  CT chest with patchy infiltrate.  Likely in the setting of encephalopathy.   TTE with normal LVEF and G1 DD.  Respiratory failure improving.  Procalcitonin and sepsis physiology resolved. -Completed IV Unasyn for 5 days. -Wean off oxygen as able -Encourage incentive spirometry  Sinus tachycardia: HR in 100s.  Improved. -IV metoprolol to 5 mg as needed HR > 120. -IV fluids  GERD: -Continue PPI   Mild hypernatremia/hypercalcemia: Likely from dehydration. -IV fluid as above  Hypokalemia: Resolved. -Replace and recheck  Tobacco use disorder -Encourage cessation -Continue nicotine patch  Generalized weakness/physical deconditioning -PT/OT recommended return to SNF for further therapy  Leukocytosis/bandemia: Likely demargination.  Resolving. -Continue monitoring  Obesity Body mass index is 31.98 kg/m.         DVT prophylaxis:  SCDs Start: 04/09/22 8110  Code Status: Full code Family Communication: None at bedside Level of care: Med-Surg.  Status is: Inpatient Remains inpatient appropriate because: Newly diagnosed DM-1, aspiration pneumonia and ileus/SBO   Final disposition: SNF Consultants:  Neurology-signed off General surgery  Sch Meds:  Scheduled Meds:  atorvastatin  20 mg Oral Daily   bisacodyl  10 mg Rectal Daily   insulin aspart  0-9 Units Subcutaneous Q4H   insulin starter kit- pen needles  1 kit Other Once   pantoprazole (PROTONIX) IV  40 mg Intravenous Q24H   Continuous Infusions:  sodium chloride Stopped (04/13/22 1951)   lactated ringers 125 mL/hr at 04/16/22 1244   PRN Meds:.sodium chloride, acetaminophen **OR** acetaminophen, dextrose, metoprolol tartrate, nicotine, ondansetron (ZOFRAN) IV  Antimicrobials: Anti-infectives (From admission, onward)    Start     Dose/Rate Route Frequency Ordered Stop   04/09/22 1100  Ampicillin-Sulbactam (UNASYN) 3 g in sodium chloride 0.9 % 100 mL IVPB  Status:  Discontinued        3 g 200 mL/hr over 30 Minutes Intravenous Every 8 hours 04/09/22 1045 04/14/22 1419        I have  personally reviewed the following labs and images: CBC: Recent Labs  Lab 04/11/22 0713 04/12/22 0312 04/13/22 0506 04/15/22 0223 04/16/22 0200  WBC 5.6 9.0 8.4 17.0* 10.6*  NEUTROABS  --   --   --   --  6.2  HGB 13.9 15.5 15.2 16.3 14.1  HCT 39.8 42.8 42.2 45.8 41.3  MCV 97.8 95.3 95.9 96.2 99.3  PLT 146* 174 171 317 322   BMP &GFR Recent Labs  Lab 04/11/22 0713 04/12/22 0312 04/13/22 0506 04/14/22 0949 04/15/22 0223 04/16/22 0200  NA 145 142 141 142 139 141  K 3.4* 3.1* 3.4* 4.0 3.7 4.3  CL 109 108 104 108 100 97*  CO2 '26 25 27 26 28 26  ' GLUCOSE 201* 236* 167* 162* 183* 174*  BUN '14 15 14 16 23 23  ' CREATININE 1.04 0.97 0.91 0.91 1.18 1.14  CALCIUM 9.4 10.1 9.6 9.7 10.6* 11.0*  MG 2.2 2.2 2.1  --  2.2 2.3  PHOS 3.8 4.7* 4.0 4.1 5.4* 5.5*   Estimated Creatinine Clearance: 82.5 mL/min (by C-G formula based on SCr of 1.14 mg/dL). Liver & Pancreas: Recent Labs  Lab 04/12/22 0312 04/13/22 0506 04/14/22 0949 04/15/22 0223 04/16/22 0200  AST  --   --   --  29  --   ALT  --   --   --  27  --   ALKPHOS  --   --   --  73  --   BILITOT  --   --   --  1.2  --   PROT  --   --   --  6.9  --   ALBUMIN 3.3* 3.2* 3.1* 3.3* 3.3*   No results for input(s): "LIPASE", "AMYLASE" in the last 168 hours. No results for input(s): "AMMONIA" in the last 168 hours.  Diabetic: No results for input(s): "HGBA1C" in the last 72 hours.  Recent Labs  Lab 04/15/22 2013 04/16/22 0003 04/16/22 0413 04/16/22 0911 04/16/22 1126  GLUCAP 206* 194* 171* 155* 146*   Cardiac Enzymes: No results for input(s): "CKTOTAL", "CKMB", "CKMBINDEX", "TROPONINI" in the last 168 hours. No results for input(s): "PROBNP" in the last 8760 hours. Coagulation Profile: No results for input(s): "INR", "PROTIME" in the last 168 hours.  Thyroid Function Tests: No results for input(s): "TSH", "T4TOTAL", "FREET4", "T3FREE", "THYROIDAB" in the last 72 hours.  Lipid Profile: No results for input(s): "CHOL",  "HDL", "LDLCALC", "TRIG", "CHOLHDL", "LDLDIRECT" in the last 72 hours.  Anemia Panel: No results for input(s): "VITAMINB12", "FOLATE", "FERRITIN", "TIBC", "IRON", "RETICCTPCT" in the last 72 hours. Urine analysis:    Component Value Date/Time   COLORURINE YELLOW 04/09/2022 0250   APPEARANCEUR CLEAR 04/09/2022 0250   LABSPEC 1.027 04/09/2022 0250   PHURINE 5.0 04/09/2022 0250   GLUCOSEU >=500 (A) 04/09/2022 0250   HGBUR SMALL (A) 04/09/2022 0250   BILIRUBINUR NEGATIVE 04/09/2022 0250   KETONESUR 80 (A) 04/09/2022 0250   PROTEINUR 100 (A) 04/09/2022 0250   UROBILINOGEN 0.2 12/08/2009 2250   NITRITE NEGATIVE 04/09/2022 0250   LEUKOCYTESUR NEGATIVE 04/09/2022 0250   Sepsis Labs: Invalid input(s): "PROCALCITONIN", "LACTICIDVEN"  Microbiology: Recent Results (from the past 240 hour(s))  Resp Panel by RT-PCR (Flu A&B, Covid) Anterior Nasal Swab     Status: None   Collection Time: 04/09/22  2:53 AM   Specimen: Anterior Nasal Swab  Result Value Ref Range Status   SARS Coronavirus 2 by RT PCR NEGATIVE NEGATIVE Final    Comment: (NOTE) SARS-CoV-2 target nucleic acids are NOT DETECTED.  The SARS-CoV-2 RNA is generally detectable in upper respiratory specimens during the acute phase of infection. The lowest concentration of SARS-CoV-2 viral copies this assay can detect is 138 copies/mL. A negative result does not preclude SARS-Cov-2 infection and should not be used as the sole basis for treatment or other patient management decisions. A negative result may occur with  improper specimen collection/handling, submission of specimen other than nasopharyngeal swab, presence of viral mutation(s) within the areas targeted by this assay, and inadequate number of viral copies(<138 copies/mL). A negative result must be combined with clinical observations, patient history, and epidemiological information. The expected result is Negative.  Fact Sheet for Patients:   EntrepreneurPulse.com.au  Fact Sheet for Healthcare Providers:  IncredibleEmployment.be  This test is no t yet approved or cleared by the [place] FDA and  has been authorized for detection and/or diagnosis of SARS-CoV-2 by FDA under an Emergency Use Authorization (EUA). This EUA will remain  in effect (meaning this test can be used) for the duration of the COVID-19 declaration under Section 564(b)(1) of the Act, 21 U.S.C.section 360bbb-3(b)(1), unless the authorization is terminated  or revoked sooner.       Influenza A by PCR NEGATIVE NEGATIVE Final   Influenza B by PCR  NEGATIVE NEGATIVE Final    Comment: (NOTE) The Xpert Xpress SARS-CoV-2/FLU/RSV plus assay is intended as an aid in the diagnosis of influenza from Nasopharyngeal swab specimens and should not be used as a sole basis for treatment. Nasal washings and aspirates are unacceptable for Xpert Xpress SARS-CoV-2/FLU/RSV testing.  Fact Sheet for Patients: EntrepreneurPulse.com.au  Fact Sheet for Healthcare Providers: IncredibleEmployment.be  This test is not yet approved or cleared by the [place] FDA and has been authorized for detection and/or diagnosis of SARS-CoV-2 by FDA under an Emergency Use Authorization (EUA). This EUA will remain in effect (meaning this test can be used) for the duration of the COVID-19 declaration under Section 564(b)(1) of the Act, 21 U.S.C. section 360bbb-3(b)(1), unless the authorization is terminated or revoked.  Performed at [facility], [place] [address], [place], [place] [address]   Culture, blood (Routine X 2) w Reflex to ID Panel     Status: None   Collection Time: 04/09/22 10:29 AM   Specimen: BLOOD  Result Value Ref Range Status   Specimen Description BLOOD SITE NOT SPECIFIED  Final   Special Requests   Final    BOTTLES DRAWN AEROBIC AND ANAEROBIC Blood Culture adequate volume   Culture    Final    NO GROWTH 5 DAYS Performed at [facility], [address], [place], [place] [address]    Report Status 04/14/2022 FINAL  Final  Culture, blood (Routine X 2) w Reflex to ID Panel     Status: None   Collection Time: 04/09/22 10:38 PM   Specimen: BLOOD RIGHT HAND  Result Value Ref Range Status   Specimen Description BLOOD RIGHT HAND  Final   Special Requests   Final    BOTTLES DRAWN AEROBIC AND ANAEROBIC Blood Culture adequate volume   Culture   Final    NO GROWTH 5 DAYS Performed at [facility], [place] [address], [place], [place] [address]    Report Status 04/14/2022 FINAL  Final    Radiology Studies: DG Abd Portable 1V  Result Date: 04/16/2022 CLINICAL DATA:  Small bowel obstruction. EXAM: PORTABLE ABDOMEN - 1 VIEW COMPARISON:  April 15, 2022. FINDINGS: Distal tip of nasogastric tube is seen in proximal stomach. Residual contrast is noted in the stomach and proximal duodenum. Mildly dilated small bowel loops are noted concerning for ileus or distal small bowel obstruction. IMPRESSION: Stable small bowel dilatation is noted concerning for distal obstruction or ileus. Electronically Signed   By: Marijo Conception M.D.   On: 04/16/2022 12:42   DG Abd Portable 1V-Small Bowel Obstruction Protocol-initial, 8 hr delay  Result Date: 04/15/2022 CLINICAL DATA:  SBO PROTOCOL-INITIAL, 8 HR DELAY EXAM: PORTABLE ABDOMEN - 1 VIEW COMPARISON:  X-ray abdomen 04/14/2022 FINDINGS: PO contrast remains within the gastric lumen. Enteric tube overlying the gastric lumen. Small bowel gaseous dilatation. No radio-opaque calculi or other significant radiographic abnormality are seen. IMPRESSION: PO contrast remains within the gastric lumen. Small bowel gaseous dilatation. Electronically Signed   By: Iven Finn M.D.   On: 04/15/2022 23:46      Alexsus Papadopoulos T. [place]  If 7PM-7AM, please contact night-coverage [email or web address] 04/16/2022, 1:09 PM

## 2022-04-16 NOTE — TOC Progression Note (Signed)
Transition of Care [facility]) - Progression Note    Patient Details  Name: Cody [name] MRN: [id number] Date of Birth: 10/23/1960  [facility] CM/SW Contact  Delilah Shan, LCSWA Phone Number: 04/16/2022, 11:47 AM  Clinical Narrative:     Plan to return to [facility] when medically ready for dc. CSW will continue to follow and assist with patients dc planning needs.  Expected Discharge Plan: Skilled Nursing Facility Barriers to Discharge: Continued Medical Work up  Expected Discharge Plan and Services Expected Discharge Plan: Skilled Nursing Facility In-house Referral: Clinical Social Work     Living arrangements for the past 2 months: Skilled Nursing Facility                                       Social Determinants of Health (SDOH) Interventions    Readmission Risk Interventions     No data to display

## 2022-04-16 NOTE — Progress Notes (Signed)
[facility] Progress Note     Subjective: CC:  NG got out 2L, then he had pulled, this was replaced yesterday. He denies pain. Reports flatus, no BM. Denies nausea or vomiting  Objective: Vital signs in last 24 hours: Temp:  [98 F (36.7 C)-98.7 F (37.1 C)] 98.7 F (37.1 C) (09/04 0826) Pulse Rate:  [108-120] 108 (09/04 0826) Resp:  [17-18] 18 (09/04 0826) BP: (105-129)/(71-88) 105/71 (09/04 0826) SpO2:  [94 %-100 %] 100 % (09/04 0826) Last BM Date : 04/13/22  Intake/Output from previous day: 09/03 0701 - 09/04 0700 In: 1479.1 [I.V.:1389.1; NG/GT:90] Out: 4850 [Urine:1200; Emesis/NG output:3650] Intake/Output this shift: No intake/output data recorded.  PE: Gen:  Alert, NAD, cooperative Card:  sinus tach on monitor - 120's, no lower extremity edema Pulm:  Normal effort ORA Abd: Soft, protuberant, tympanic, nontender, scarring over umbilicus Skin: warm and dry, no rashes  Psych: A&Ox3   Lab Results:  Recent Labs    04/15/22 0223 04/16/22 0200  WBC 17.0* 10.6*  HGB 16.3 14.1  HCT 45.8 41.3  PLT 317 322   BMET Recent Labs    04/15/22 0223 04/16/22 0200  NA 139 141  K 3.7 4.3  CL 100 97*  CO2 28 26  GLUCOSE 183* 174*  BUN 23 23  CREATININE 1.18 1.14  CALCIUM 10.6* 11.0*   PT/INR No results for input(s): "LABPROT", "INR" in the last 72 hours. CMP     Component Value Date/Time   NA 141 04/16/2022 0200   NA 142 11/15/2017 1646   K 4.3 04/16/2022 0200   CL 97 (L) 04/16/2022 0200   CO2 26 04/16/2022 0200   GLUCOSE 174 (H) 04/16/2022 0200   BUN 23 04/16/2022 0200   BUN 15 11/15/2017 1646   CREATININE 1.14 04/16/2022 0200   CREATININE 1.14 09/27/2016 1532   CALCIUM 11.0 (H) 04/16/2022 0200   PROT 6.9 04/15/2022 0223   PROT 7.3 10/07/2017 0954   ALBUMIN 3.3 (L) 04/16/2022 0200   ALBUMIN 4.4 10/07/2017 0954   AST 29 04/15/2022 0223   ALT 27 04/15/2022 0223   ALKPHOS 73 04/15/2022 0223   BILITOT 1.2 04/15/2022 0223   BILITOT 0.6  10/07/2017 0954   GFRNONAA >60 04/16/2022 0200   GFRNONAA 71 09/27/2016 1532   GFRAA >60 01/18/2018 0526   GFRAA 82 09/27/2016 1532   Lipase  No results found for: "LIPASE"     Studies/Results: DG Abd Portable 1V-Small Bowel Obstruction Protocol-initial, 8 hr delay  Result Date: 04/15/2022 CLINICAL DATA:  SBO PROTOCOL-INITIAL, 8 HR DELAY EXAM: PORTABLE ABDOMEN - 1 VIEW COMPARISON:  X-ray abdomen 04/14/2022 FINDINGS: PO contrast remains within the gastric lumen. Enteric tube overlying the gastric lumen. Small bowel gaseous dilatation. No radio-opaque calculi or other significant radiographic abnormality are seen. IMPRESSION: PO contrast remains within the gastric lumen. Small bowel gaseous dilatation. Electronically Signed   By: Tish Frederickson M.D.   On: 04/15/2022 23:46   DG Abd Portable 1V-Small Bowel Protocol-Position Verification  Result Date: 04/14/2022 CLINICAL DATA:  NG tube placement. EXAM: PORTABLE ABDOMEN - 1 VIEW COMPARISON:  CT earlier today FINDINGS: Tip and side port of the enteric tube below the diaphragm in the stomach. There are persistent dilated loops of small bowel in the central abdomen. IMPRESSION: 1. Tip and side port of the enteric tube below the diaphragm in the stomach. 2. Persistent dilated small bowel in the central abdomen. Electronically Signed   By: Narda Rutherford M.D.   On: 04/14/2022 23:46  CT ABDOMEN PELVIS W CONTRAST  Result Date: 04/14/2022 CLINICAL DATA:  Abdominal pain EXAM: CT ABDOMEN AND PELVIS WITH CONTRAST TECHNIQUE: Multidetector CT imaging of the abdomen and pelvis was performed using the standard protocol following bolus administration of intravenous contrast. RADIATION DOSE REDUCTION: This exam was performed according to the departmental dose-optimization program which includes automated exposure control, adjustment of the mA and/or kV according to patient size and/or use of iterative reconstruction technique. CONTRAST:  OMNIPAQUE IOHEXOL 300  MG/ML SOLN additional oral enteric contrast COMPARISON:  10/18/2015 FINDINGS: Lower chest: Small bilateral pleural effusions and associated atelectasis or consolidation. Coronary artery calcifications. Hepatobiliary: No solid liver abnormality is seen. No gallstones, gallbladder wall thickening, or biliary dilatation. Pancreas: Unremarkable. No pancreatic ductal dilatation or surrounding inflammatory changes. Spleen: Normal in size without significant abnormality. Adrenals/Urinary Tract: Adrenal glands are unremarkable. Kidneys are normal, without renal calculi, solid lesion, or hydronephrosis. Bladder is unremarkable. Stomach/Bowel: Stomach is within normal limits. The proximal small bowel is mildly distended by fluid and oral enteric contrast, largest loops measuring up to 3.9 cm. Although a specific transition point is difficult to identify, the terminal loops of ileum are sharply decompressed, and there is an abrupt caliber change of the distal ileum in the right hemiabdomen (in the vicinity of series 6, image 40, series 3, image 64). Moderate burden of stool throughout the colon. Gas and stool present to the rectum. Vascular/Lymphatic: Aortic atherosclerosis. No enlarged abdominal or pelvic lymph nodes. Reproductive: No mass or other significant abnormality. Other: Small, fat containing bilateral inguinal hernias. Small volume ascites. Musculoskeletal: No acute or significant osseous findings. IMPRESSION: 1. The proximal small bowel is mildly distended by fluid and oral enteric contrast, largest loops measuring up to 3.9 cm. Although a specific transition point is difficult to identify, the terminal loops of ileum are sharply decompressed, and there is an abrupt caliber change of the distal ileum in the right hemiabdomen. Findings are concerning for developing or partial small bowel obstruction. 2. Moderate burden of stool throughout the colon to the rectum. 3. Small volume ascites. 4. Small bilateral pleural  effusions and associated atelectasis or consolidation. 5. Coronary artery disease. Aortic Atherosclerosis (ICD10-I70.0). Electronically Signed   By: Jearld Lesch M.D.   On: 04/14/2022 17:01    Anti-infectives: Anti-infectives (From admission, onward)    Start     Dose/Rate Route Frequency Ordered Stop   04/09/22 1100  Ampicillin-Sulbactam (UNASYN) 3 g in sodium chloride 0.9 % 100 mL IVPB  Status:  Discontinued        3 g 200 mL/hr over 30 Minutes Intravenous Every 8 hours 04/09/22 1045 04/14/22 1419        Assessment/Plan  Cody [name] is an 61 y.o. male with GERD, newly diagnosed T1DM with now possible ileus vs sbo/psbo   -SBO protocol ordered 9/2. NG back in place, continue to LIWS. SBO protocol underway  I spent a total of 35 minutes in both face-to-face and non-face-to-face activities, excluding procedures performed, for this visit on the date of this encounter.     LOS: 7 days   I reviewed nursing notes, hospitalist notes, last 24 h vitals and pain scores, last 48 h intake and output, last 24 h labs and trends, and last 24 h imaging results.  Marin Olp, MD [facility], A [facility]

## 2022-04-17 ENCOUNTER — Inpatient Hospital Stay: Payer: Self-pay

## 2022-04-17 ENCOUNTER — Inpatient Hospital Stay (HOSPITAL_COMMUNITY): Payer: Medicaid Other | Admitting: Certified Registered Nurse Anesthetist

## 2022-04-17 ENCOUNTER — Encounter (HOSPITAL_COMMUNITY)
Admission: EM | Disposition: A | Discharge status: Skilled Nursing Facility | Payer: Self-pay | Source: Skilled Nursing Facility | Attending: Student

## 2022-04-17 ENCOUNTER — Encounter (HOSPITAL_COMMUNITY): Payer: Self-pay | Admitting: Internal Medicine

## 2022-04-17 ENCOUNTER — Other Ambulatory Visit: Payer: Self-pay

## 2022-04-17 DIAGNOSIS — G9341 Metabolic encephalopathy: Secondary | ICD-10-CM | POA: Diagnosis not present

## 2022-04-17 DIAGNOSIS — M1990 Unspecified osteoarthritis, unspecified site: Secondary | ICD-10-CM

## 2022-04-17 DIAGNOSIS — K5651 Intestinal adhesions [bands], with partial obstruction: Secondary | ICD-10-CM

## 2022-04-17 DIAGNOSIS — N179 Acute kidney failure, unspecified: Secondary | ICD-10-CM | POA: Diagnosis not present

## 2022-04-17 DIAGNOSIS — E1165 Type 2 diabetes mellitus with hyperglycemia: Secondary | ICD-10-CM

## 2022-04-17 DIAGNOSIS — E109 Type 1 diabetes mellitus without complications: Secondary | ICD-10-CM | POA: Diagnosis not present

## 2022-04-17 DIAGNOSIS — K565 Intestinal adhesions [bands], unspecified as to partial versus complete obstruction: Secondary | ICD-10-CM

## 2022-04-17 DIAGNOSIS — Z794 Long term (current) use of insulin: Secondary | ICD-10-CM

## 2022-04-17 DIAGNOSIS — E1010 Type 1 diabetes mellitus with ketoacidosis without coma: Secondary | ICD-10-CM | POA: Diagnosis not present

## 2022-04-17 DIAGNOSIS — N289 Disorder of kidney and ureter, unspecified: Secondary | ICD-10-CM

## 2022-04-17 HISTORY — PX: LAPAROTOMY: SHX154

## 2022-04-17 LAB — TYPE AND SCREEN
ABO/RH(D): O POS
Antibody Screen: NEGATIVE

## 2022-04-17 LAB — COMPREHENSIVE METABOLIC PANEL
ALT: 23 U/L (ref 0–44)
AST: 23 U/L (ref 15–41)
Albumin: 2.8 g/dL — ABNORMAL LOW (ref 3.5–5.0)
Alkaline Phosphatase: 53 U/L (ref 38–126)
Anion gap: 9 (ref 5–15)
BUN: 21 mg/dL (ref 8–23)
CO2: 24 mmol/L (ref 22–32)
Calcium: 8.7 mg/dL — ABNORMAL LOW (ref 8.9–10.3)
Chloride: 106 mmol/L (ref 98–111)
Creatinine, Ser: 1.22 mg/dL (ref 0.61–1.24)
GFR, Estimated: >60 mL/min (ref >60)
Glucose, Bld: 118 mg/dL — ABNORMAL HIGH (ref 70–99)
Potassium: 3.2 mmol/L — ABNORMAL LOW (ref 3.5–5.1)
Sodium: 139 mmol/L (ref 135–145)
Total Bilirubin: 1.3 mg/dL — ABNORMAL HIGH (ref 0.3–1.2)
Total Protein: 5.9 g/dL — ABNORMAL LOW (ref 6.5–8.1)

## 2022-04-17 LAB — CBC WITH DIFFERENTIAL/PLATELET
Abs Immature Granulocytes: 0.18 10*3/uL — ABNORMAL HIGH (ref 0.00–0.07)
Basophils Absolute: 0.1 10*3/uL (ref 0.0–0.1)
Basophils Relative: 1 %
Eosinophils Absolute: 0.1 10*3/uL (ref 0.0–0.5)
Eosinophils Relative: 1 %
HCT: 35.4 % — ABNORMAL LOW (ref 39.0–52.0)
Hemoglobin: 11.9 g/dL — ABNORMAL LOW (ref 13.0–17.0)
Immature Granulocytes: 2 %
Lymphocytes Relative: 33 %
Lymphs Abs: 3.5 10*3/uL (ref 0.7–4.0)
MCH: 34.3 pg — ABNORMAL HIGH (ref 26.0–34.0)
MCHC: 33.6 g/dL (ref 30.0–36.0)
MCV: 102 fL — ABNORMAL HIGH (ref 80.0–100.0)
Monocytes Absolute: 1.5 10*3/uL — ABNORMAL HIGH (ref 0.1–1.0)
Monocytes Relative: 14 %
Neutro Abs: 5.2 10*3/uL (ref 1.7–7.7)
Neutrophils Relative %: 49 %
Platelets: 305 10*3/uL (ref 150–400)
RBC: 3.47 MIL/uL — ABNORMAL LOW (ref 4.22–5.81)
RDW: 12.7 % (ref 11.5–15.5)
WBC: 10.6 10*3/uL — ABNORMAL HIGH (ref 4.0–10.5)
nRBC: 0.4 % — ABNORMAL HIGH (ref 0.0–0.2)

## 2022-04-17 LAB — GLUCOSE, CAPILLARY
Glucose-Capillary: 110 mg/dL — ABNORMAL HIGH (ref 70–99)
Glucose-Capillary: 124 mg/dL — ABNORMAL HIGH (ref 70–99)
Glucose-Capillary: 103 mg/dL — ABNORMAL HIGH (ref 70–99)
Glucose-Capillary: 143 mg/dL — ABNORMAL HIGH (ref 70–99)
Glucose-Capillary: 172 mg/dL — ABNORMAL HIGH (ref 70–99)
Glucose-Capillary: 159 mg/dL — ABNORMAL HIGH (ref 70–99)

## 2022-04-17 LAB — PHOSPHORUS: Phosphorus: 2.9 mg/dL (ref 2.5–4.6)

## 2022-04-17 LAB — MAGNESIUM: Magnesium: 2.2 mg/dL (ref 1.7–2.4)

## 2022-04-17 SURGERY — LAPAROTOMY, EXPLORATORY
Anesthesia: General | Site: Abdomen

## 2022-04-17 MED ORDER — ACETAMINOPHEN 10 MG/ML IV SOLN
1000.0000 mg | Freq: Four times a day (QID) | INTRAVENOUS | Status: DC
Start: 2022-04-17 — End: 2022-04-17
  Filled 2022-04-17 (×3): qty 100

## 2022-04-17 MED ORDER — LACTATED RINGERS IV SOLN: INTRAVENOUS | Status: DC | PRN

## 2022-04-17 MED ORDER — HYDROMORPHONE HCL 1 MG/ML IJ SOLN
0.2500 mg | INTRAMUSCULAR | Status: DC | PRN
  Administered 2022-04-17 (×2): 0.25 mg via INTRAVENOUS

## 2022-04-17 MED ORDER — PROPOFOL 10 MG/ML IV BOLUS
INTRAVENOUS | Status: AC
  Filled 2022-04-17: qty 20

## 2022-04-17 MED ORDER — PHENYLEPHRINE HCL-NACL 20-0.9 MG/250ML-% IV SOLN
INTRAVENOUS | Status: DC | PRN
  Administered 2022-04-17: 25 ug/min via INTRAVENOUS

## 2022-04-17 MED ORDER — CEFAZOLIN SODIUM-DEXTROSE 2-4 GM/100ML-% IV SOLN
2.0000 g | INTRAVENOUS | Status: AC
  Administered 2022-04-17: 2 g via INTRAVENOUS
  Filled 2022-04-17: qty 100

## 2022-04-17 MED ORDER — LIDOCAINE 2% (20 MG/ML) 5 ML SYRINGE
INTRAMUSCULAR | Status: DC | PRN
  Administered 2022-04-17: 60 mg via INTRAVENOUS

## 2022-04-17 MED ORDER — SUGAMMADEX SODIUM 200 MG/2ML IV SOLN
INTRAVENOUS | Status: DC | PRN
  Administered 2022-04-17: 500 mg via INTRAVENOUS

## 2022-04-17 MED ORDER — KETAMINE HCL 50 MG/5ML IJ SOSY
PREFILLED_SYRINGE | INTRAMUSCULAR | Status: AC
  Filled 2022-04-17: qty 5

## 2022-04-17 MED ORDER — SUCCINYLCHOLINE CHLORIDE 200 MG/10ML IV SOSY
PREFILLED_SYRINGE | INTRAVENOUS | Status: DC | PRN
  Administered 2022-04-17: 140 mg via INTRAVENOUS

## 2022-04-17 MED ORDER — POTASSIUM CHLORIDE 10 MEQ/100ML IV SOLN
10.0000 meq | INTRAVENOUS | Status: AC
  Administered 2022-04-17 (×2): 10 meq via INTRAVENOUS
  Filled 2022-04-17 (×2): qty 100

## 2022-04-17 MED ORDER — AMISULPRIDE (ANTIEMETIC) 5 MG/2ML IV SOLN
10.0000 mg | Freq: Once | INTRAVENOUS | Status: DC | PRN
Start: 2022-04-17 — End: 2022-04-17

## 2022-04-17 MED ORDER — DEXAMETHASONE SODIUM PHOSPHATE 10 MG/ML IJ SOLN
INTRAMUSCULAR | Status: DC | PRN
  Administered 2022-04-17: 5 mg via INTRAVENOUS

## 2022-04-17 MED ORDER — ACETAMINOPHEN 10 MG/ML IV SOLN
INTRAVENOUS | Status: DC | PRN
  Administered 2022-04-17: 1000 mg via INTRAVENOUS

## 2022-04-17 MED ORDER — LACTATED RINGERS IV SOLN: INTRAVENOUS | Status: DC

## 2022-04-17 MED ORDER — HYDROMORPHONE HCL 1 MG/ML IJ SOLN
INTRAMUSCULAR | Status: AC
  Filled 2022-04-17: qty 1

## 2022-04-17 MED ORDER — POTASSIUM CHLORIDE 2 MEQ/ML IV SOLN
INTRAVENOUS | Status: AC
  Filled 2022-04-17 (×5): qty 1000

## 2022-04-17 MED ORDER — ORAL CARE MOUTH RINSE
15.0000 mL | Freq: Once | OROMUCOSAL | Status: AC
Start: 2022-04-17 — End: 2022-04-17

## 2022-04-17 MED ORDER — ALBUMIN HUMAN 5 % IV SOLN: INTRAVENOUS | Status: DC | PRN

## 2022-04-17 MED ORDER — CHLORHEXIDINE GLUCONATE 0.12 % MT SOLN: 15.0000 mL | Freq: Once | OROMUCOSAL | Status: AC

## 2022-04-17 MED ORDER — ACETAMINOPHEN 10 MG/ML IV SOLN
INTRAVENOUS | Status: AC
  Filled 2022-04-17: qty 100

## 2022-04-17 MED ORDER — PHENYLEPHRINE 80 MCG/ML (10ML) SYRINGE FOR IV PUSH (FOR BLOOD PRESSURE SUPPORT)
PREFILLED_SYRINGE | INTRAVENOUS | Status: DC | PRN
  Administered 2022-04-17: 80 ug via INTRAVENOUS
  Administered 2022-04-17: 20 ug via INTRAVENOUS

## 2022-04-17 MED ORDER — KETAMINE HCL 10 MG/ML IJ SOLN
INTRAMUSCULAR | Status: DC | PRN
  Administered 2022-04-17: 30 mg via INTRAVENOUS

## 2022-04-17 MED ORDER — ROCURONIUM BROMIDE 10 MG/ML (PF) SYRINGE
PREFILLED_SYRINGE | INTRAVENOUS | Status: DC | PRN
  Administered 2022-04-17: 20 mg via INTRAVENOUS
  Administered 2022-04-17: 50 mg via INTRAVENOUS
  Administered 2022-04-17: 30 mg via INTRAVENOUS

## 2022-04-17 MED ORDER — FENTANYL CITRATE (PF) 250 MCG/5ML IJ SOLN
INTRAMUSCULAR | Status: DC | PRN
Start: 2022-04-17 — End: 2022-04-17
  Administered 2022-04-17 (×2): 50 ug via INTRAVENOUS
  Administered 2022-04-17: 100 ug via INTRAVENOUS

## 2022-04-17 MED ORDER — CHLORHEXIDINE GLUCONATE CLOTH 2 % EX PADS
6.0000 | MEDICATED_PAD | Freq: Once | CUTANEOUS | Status: DC
  Administered 2022-04-17: 6 via TOPICAL

## 2022-04-17 MED ORDER — MIDAZOLAM HCL 2 MG/2ML IJ SOLN
INTRAMUSCULAR | Status: AC
  Filled 2022-04-17: qty 2

## 2022-04-17 MED ORDER — MIDAZOLAM HCL 2 MG/2ML IJ SOLN
INTRAMUSCULAR | Status: DC | PRN
  Administered 2022-04-17: 1 mg via INTRAVENOUS

## 2022-04-17 MED ORDER — FENTANYL CITRATE (PF) 250 MCG/5ML IJ SOLN
INTRAMUSCULAR | Status: AC
  Filled 2022-04-17: qty 5

## 2022-04-17 MED ORDER — CHLORHEXIDINE GLUCONATE 0.12 % MT SOLN
OROMUCOSAL | Status: AC
  Administered 2022-04-17: 15 mL via OROMUCOSAL
  Filled 2022-04-17: qty 15

## 2022-04-17 MED ORDER — ONDANSETRON HCL 4 MG/2ML IJ SOLN
INTRAMUSCULAR | Status: DC | PRN
  Administered 2022-04-17: 4 mg via INTRAVENOUS

## 2022-04-17 MED ORDER — ONDANSETRON HCL 4 MG/2ML IJ SOLN
4.0000 mg | Freq: Once | INTRAMUSCULAR | Status: DC | PRN
Start: 2022-04-17 — End: 2022-04-17

## 2022-04-17 MED ORDER — CHLORHEXIDINE GLUCONATE CLOTH 2 % EX PADS: 6.0000 | MEDICATED_PAD | Freq: Once | CUTANEOUS | Status: DC

## 2022-04-17 MED ORDER — 0.9 % SODIUM CHLORIDE (POUR BTL) OPTIME
TOPICAL | Status: DC | PRN
  Administered 2022-04-17 (×2): 1000 mL

## 2022-04-17 MED ORDER — ACETAMINOPHEN 10 MG/ML IV SOLN
1000.0000 mg | Freq: Four times a day (QID) | INTRAVENOUS | Status: AC
  Administered 2022-04-17 – 2022-04-18 (×4): 1000 mg via INTRAVENOUS
  Filled 2022-04-17 (×4): qty 100

## 2022-04-17 MED ORDER — MORPHINE SULFATE (PF) 2 MG/ML IV SOLN
2.0000 mg | INTRAVENOUS | Status: DC | PRN
  Administered 2022-04-17: 4 mg via INTRAVENOUS
  Administered 2022-04-17 – 2022-04-18 (×2): 2 mg via INTRAVENOUS
  Administered 2022-04-18 (×2): 4 mg via INTRAVENOUS
  Administered 2022-04-19 – 2022-04-23 (×6): 2 mg via INTRAVENOUS
  Administered 2022-04-28: 4 mg via INTRAVENOUS
  Filled 2022-04-17 (×3): qty 2
  Filled 2022-04-17 (×3): qty 1
  Filled 2022-04-17: qty 2
  Filled 2022-04-17 (×6): qty 1

## 2022-04-17 MED ORDER — PROPOFOL 10 MG/ML IV BOLUS
INTRAVENOUS | Status: DC | PRN
  Administered 2022-04-17: 150 mg via INTRAVENOUS

## 2022-04-17 SURGICAL SUPPLY — 38 items
APL PRP STRL LF DISP 70% ISPRP (MISCELLANEOUS) ×1
BAG COUNTER SPONGE SURGICOUNT (BAG) ×1 IMPLANT
BAG SPNG CNTER NS LX DISP (BAG) ×1
BINDER ABDOMINAL 12 ML 46-62 (SOFTGOODS) IMPLANT
BINDER ABDOMINAL 12 XL 75-84 (SOFTGOODS) IMPLANT
BLADE CLIPPER SURG (BLADE) IMPLANT
CANISTER SUCT 3000ML PPV (MISCELLANEOUS) ×1 IMPLANT
CHLORAPREP W/TINT 26 (MISCELLANEOUS) ×1 IMPLANT
COVER SURGICAL LIGHT HANDLE (MISCELLANEOUS) ×1 IMPLANT
DRAPE LAPAROSCOPIC ABDOMINAL (DRAPES) ×1 IMPLANT
DRAPE WARM FLUID 44X44 (DRAPES) ×1 IMPLANT
ELECT BLADE 6.5 EXT (BLADE) IMPLANT
ELECT CAUTERY BLADE 6.4 (BLADE) ×1 IMPLANT
ELECT REM PT RETURN 9FT ADLT (ELECTROSURGICAL) ×1
ELECTRODE REM PT RTRN 9FT ADLT (ELECTROSURGICAL) ×1 IMPLANT
GLOVE BIO SURGEON STRL SZ8 (GLOVE) ×1 IMPLANT
GLOVE BIOGEL PI IND STRL 8 (GLOVE) ×1 IMPLANT
GOWN STRL REUS W/ TWL LRG LVL3 (GOWN DISPOSABLE) ×1 IMPLANT
GOWN STRL REUS W/ TWL XL LVL3 (GOWN DISPOSABLE) ×1 IMPLANT
GOWN STRL REUS W/TWL LRG LVL3 (GOWN DISPOSABLE) ×1
GOWN STRL REUS W/TWL XL LVL3 (GOWN DISPOSABLE) ×1
HANDLE SUCTION POOLE (INSTRUMENTS) ×1 IMPLANT
KIT BASIN OR (CUSTOM PROCEDURE TRAY) ×1 IMPLANT
KIT TURNOVER KIT B (KITS) ×1 IMPLANT
NS IRRIG 1000ML POUR BTL (IV SOLUTION) ×2 IMPLANT
OPSITE POST-OP VISIBLE 11 3/4 IN X 4 IN IMPLANT
PACK GENERAL/GYN (CUSTOM PROCEDURE TRAY) ×1 IMPLANT
PAD ARMBOARD 7.5X6 YLW CONV (MISCELLANEOUS) ×1 IMPLANT
PENCIL SMOKE EVACUATOR (MISCELLANEOUS) ×1 IMPLANT
SPECIMEN JAR LARGE (MISCELLANEOUS) IMPLANT
SPONGE T-LAP 18X18 ~~LOC~~+RFID (SPONGE) IMPLANT
STAPLER VISISTAT 35W (STAPLE) ×1 IMPLANT
SUCTION POOLE HANDLE (INSTRUMENTS) ×1
SUT PDS AB 1 TP1 96 (SUTURE) ×2 IMPLANT
SUT VIC AB 2-0 SH 18 (SUTURE) ×1 IMPLANT
SUT VIC AB 3-0 SH 18 (SUTURE) ×1 IMPLANT
TOWEL GREEN STERILE (TOWEL DISPOSABLE) ×1 IMPLANT
YANKAUER SUCT BULB TIP NO VENT (SUCTIONS) IMPLANT

## 2022-04-17 NOTE — Op Note (Signed)
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  Initially presented with diabetic ketoacidosis and uncontrolled medical problems.  He then developed what was felt to be an ileus.  After about a week of nonoperative management, a CT scan showed findings worrisome for potential small bowel obstruction.  Nasogastric tube was placed.  He was decompressed for about 48 more hours in the small bowel protocol was initiated.  This did not show any contrast in his colon after 48 hours.  Since he was more distended and putting out over a liter a day, I did not feel continue medical management would be in his best interest since he has had no nutrition.  Upon reviewing his films I did feel he had a very high-grade partial or complete small bowel obstruction.  I discussed this with the patient.  Discussed treatment options of surgery versus continued observation.  I discussed the success rates of continued observation to be very low at this point since he has been managed more than likely for 4 to 5 days with no resolution.  I discussed risk of surgery to include but NOT  exclusive of bleeding, infection, bowel injury, fistula formation, more adhesions, abdominal wound problems, anesthesia risk, cardiovascular risk, DVT, UTI, worsening underlying medical problems, and potentially low risk of death.  I discussed potential need for small bowel resection if needed and/or additional surgery if this did not help his condition and/or improve his quality of life.  I  discussed success rates to be about 85 to 90%.  After all the above discussion and discussion of nonoperative management options he agreed to proceed.  Description of procedure: The patient was met in the holding area and questions were answered.  He was taken to the operating room placed upon upon the OR table.  After induction of general esthesia, the abdomen was prepped and draped in a sterile fashion and timeout performed.  Proper patient, site and procedure verified.  He received appropriate preoperative antibiotics.  An upper midline incision was used.  Upon entry of the abdominal cavity there is about a liter of clear ascites.  There is significant dilation of the small bowel.  The incision was extended for better exposure.  I then eviscerated the small bowel.  I ran this from the ligament of Treitz down toward the terminal ileum.  The terminal ileum region there is significant fibrotic change of the mesentery noted.  There is no abscess or signs of perforation noted.  The appendix and cecum were grossly normal.  Most of the obstruction was distal at the level of the ileum.  There is no creeping fat or any other findings consistent with inflammatory bowel disease.  I did take a biopsy of the mesentery fibrotic reaction and sent this to pathology.  There is some oozing noted from a very thinned wall small bowel.  There were 2 areas where I placed 3-0 Vicryl sutures due to some oozing from the adhesiolysis.  There is no evidence of full-thickness injury though.  I then ran the small bowel from the ligament of Treitz to the ileocecal valve.  I  did relay some adhesions around the terminal ileum where the ileum was corkscrew and into the cecum.  This helped to straighten that out.  I reexamined the bowel and found no other evidence of injury.  NG tube was replaced back in the stomach.  The ascending colon was normal, transverse colon was normal, descending colon and sigmoid colon were grossly normal.  No signs  of diverticulitis that I could see.  There is no intra-abdominal abscess.  He had a significant amount of intra-abdominal visceral fat noted.  Had a very thin abdominal wall.  Irrigation was then used.  We then closed the fascia with a running double-stranded #1 PDS.  Skin was closed staples.  All counts were found to be correct.  NG tube was placed to suction.  He was then placed into an abdominal binder.  He was then taken to recovery extubated in stable condition.

## 2022-04-17 NOTE — Progress Notes (Signed)
Mobility Specialist Progress Note:   04/17/22 1051  Mobility  Activity Off unit   Pt off unit. Will follow-up as time allows.   [facility] Cody [name] Mobility Specialist

## 2022-04-17 NOTE — TOC Progression Note (Signed)
Transition of Care [facility]) - Progression Note    Patient Details  Name: Cody [name] MRN: [id number] Date of Birth: 22-Oct-1960  Transition of Care [facility]) CM/SW Contact  Delilah Shan, LCSWA Phone Number: 04/17/2022, 3:46 PM  Clinical Narrative:       Plan to return to [facility] when medically ready for dc. CSW will continue to follow and assist with patients dc planning needs.  Expected Discharge Plan: Skilled Nursing Facility Barriers to Discharge: Continued Medical Work up  Expected Discharge Plan and Services Expected Discharge Plan: Skilled Nursing Facility In-house Referral: Clinical Social Work     Living arrangements for the past 2 months: Skilled Nursing Facility                                       Social Determinants of Health (SDOH) Interventions    Readmission Risk Interventions     No data to display

## 2022-04-17 NOTE — Anesthesia Postprocedure Evaluation (Signed)
Anesthesia Post Note  Patient: Alex L Lykins  Procedure(s) Performed: EXPLORATORY LAPAROTOMY (Abdomen)     Patient location during evaluation: PACU Anesthesia Type: General Level of consciousness: awake and alert, oriented and patient cooperative Pain management: pain level controlled Vital Signs Assessment: post-procedure vital signs reviewed and stable Respiratory status: spontaneous breathing, nonlabored ventilation and respiratory function stable Cardiovascular status: blood pressure returned to baseline and stable Postop Assessment: no apparent nausea or vomiting Anesthetic complications: yes   Encounter Notable Events  Notable Event Outcome Phase Comment  Difficult to intubate - expected  Intraprocedure Filed from anesthesia note documentation.    Last Vitals:  Vitals:   04/17/22 1100 04/17/22 1334  BP: 119/73 (!) 142/91  Pulse: 96 (!) 109  Resp: 18 (!) 22  Temp: 36.8 C (!) 36.3 C  SpO2: 94% 94%    Last Pain:  Vitals:   04/17/22 1334  TempSrc:   PainSc: 0-No pain                 Lannie Fields

## 2022-04-17 NOTE — Anesthesia Preprocedure Evaluation (Addendum)
Anesthesia Evaluation  Patient identified by MRN, date of birth, ID band Patient awake    Reviewed: Allergy & Precautions, NPO status , Patient's Chart, lab work & pertinent test results  Airway Mallampati: IV  TM Distance: >3 FB Neck ROM: Limited    Dental  (+) Missing, Poor Dentition, Dental Advisory Given   Pulmonary Current Smoker,  5cigg/d, long time smoker   Pulmonary exam normal breath sounds clear to auscultation       Cardiovascular negative cardio ROS Normal cardiovascular exam Rhythm:Regular Rate:Normal  Echo 04/10/22 1. Left ventricular ejection fraction, by estimation, is 65 to 70%. The  left ventricle has normal function. The left ventricle has no regional  wall motion abnormalities. Left ventricular diastolic parameters are  consistent with Grade I diastolic  dysfunction (impaired relaxation).  2. Right ventricular systolic function is normal. The right ventricular  size is normal.  3. The mitral valve is normal in structure. No evidence of mitral valve  regurgitation. No evidence of mitral stenosis.  4. The aortic valve is tricuspid. Aortic valve regurgitation is not  visualized. No aortic stenosis is present.    Neuro/Psych negative psych ROS   GI/Hepatic GERD  Medicated and Controlled,(+)     substance abuse  alcohol use, cocaine use and marijuana use, Per pt last use 33mo ago SBO   Endo/Other  diabetes, Poorly Controlled, Type 2Newly diagnosed T2DM, FS 103 1h ago  Renal/GU Renal InsufficiencyRenal diseaseCr 1.22  negative genitourinary   Musculoskeletal  (+) Arthritis , Osteoarthritis,    Abdominal   Peds negative pediatric ROS (+)  Hematology negative hematology ROS (+) Hb 11.9, plt 305   Anesthesia Other Findings   Reproductive/Obstetrics negative OB ROS                            Anesthesia Physical Anesthesia Plan  ASA: 3  Anesthesia Plan: General    Post-op Pain Management: Ofirmev IV (intra-op)*   Induction: Intravenous, Rapid sequence and Cricoid pressure planned  PONV Risk Score and Plan: 1 and Ondansetron, Dexamethasone, Midazolam and Treatment may vary due to age or medical condition  Airway Management Planned: Oral ETT and Video Laryngoscope Planned  Additional Equipment: None  Intra-op Plan:   Post-operative Plan: Extubation in OR  Informed Consent: I have reviewed the patients History and Physical, chart, labs and discussed the procedure including the risks, benefits and alternatives for the proposed anesthesia with the patient or authorized representative who has indicated his/her understanding and acceptance.     Dental advisory given  Plan Discussed with: CRNA  Anesthesia Plan Comments: (Access: 20G x 1 PIV NGT w/ >1L output today   Will place additional PIV  Type and screen)       Anesthesia Quick Evaluation

## 2022-04-17 NOTE — Transfer of Care (Signed)
Immediate Anesthesia Transfer of Care Note  Patient: Cody [name]  Procedure(s) Performed: EXPLORATORY LAPAROTOMY (Abdomen)  Patient Location: PACU  Anesthesia Type:General  Level of Consciousness: awake, drowsy and patient cooperative  Airway & Oxygen Therapy: Patient Spontanous Breathing and Patient connected to face mask oxygen  Post-op Assessment: Report given to RN and Post -op Vital signs reviewed and stable  Post vital signs: Reviewed and stable  Last Vitals:  Vitals Value Taken Time  BP 142/91 04/17/22 1334  Temp    Pulse 108 04/17/22 1337  Resp 19 04/17/22 1337  SpO2 93 % 04/17/22 1337  Vitals shown include unvalidated device data.  Last Pain:  Vitals:   04/17/22 1107  TempSrc:   PainSc: 0-No pain      Patients Stated Pain Goal: 0 (04/11/22 1153)  Complications:  Encounter Notable Events  Notable Event Outcome Phase Comment  Difficult to intubate - expected  Intraprocedure Filed from anesthesia note documentation.

## 2022-04-17 NOTE — Interval H&P Note (Signed)
History and Physical Interval Note:  04/17/2022 11:03 AM  Josuha L Kaczynski  has presented today for surgery, with the diagnosis of Small Bowel Obstruction.  The various methods of treatment have been discussed with the patient and family. After consideration of risks, benefits and other options for treatment, the patient has consented to  Procedure(s): EXPLORATORY LAPAROTOMY (N/A) as a surgical intervention.  The patient's history has been reviewed, patient examined, no change in status, stable for surgery.  I have reviewed the patient's chart and labs.  Questions were answered to the patient's satisfaction.    Discussed procedure with the patient.  Risks and benefits as well as alternative treatments discussed with the patient.  Since he has not improved over the last 2 days, I do not feel he will improve without surgery.  He voiced understanding and agreed to proceed.The procedure has been discussed with the patient.  Alternative therapies have been discussed with the patient.  Operative risks include bleeding,  Infection,  Organ injury,  Nerve injury,  Blood vessel injury,  DVT,  Pulmonary embolism,  Death,  And possible reoperation.  Medical management risks include worsening of present situation.  The success of the procedure is 50 -90 % at treating patients symptoms.  The patient understands and agrees to proceed.    I left a message for his mother on her voicemail to let her know about patient's condition per patient request. Clovis Pu Jcion Buddenhagen

## 2022-04-17 NOTE — Progress Notes (Addendum)
Subjective/Chief Complaint: Patient about the same.  Reports flatus.  Has over a liter of NG tube output.  Still distended.   Objective: Vital signs in last 24 hours: Temp:  [98 F (36.7 C)-98.9 F (37.2 C)] 98.2 F (36.8 C) (09/05 0400) Pulse Rate:  [100-110] 102 (09/05 0406) Resp:  [18-20] 20 (09/05 0400) BP: (110-124)/(69-87) 116/71 (09/05 0400) SpO2:  [90 %-94 %] 92 % (09/05 0406) Weight:  [102.7 kg] 102.7 kg (09/05 0406) Last BM Date : 04/13/22  Intake/Output from previous day: 09/04 0701 - 09/05 0700 In: 2967.7 [I.V.:2967.7] Out: 3905 [Urine:2905; Emesis/NG output:1000] Intake/Output this shift: No intake/output data recorded.   Gen:  Alert, NAD, cooperative Card:  sinus tach on monitor - 120's, no lower extremity edema Pulm:  Normal effort ORA Abd: Soft, protuberant, tympanic, nontender, scarring over umbilicus Skin: warm and dry, no rashes  Psych: A&Ox3  Lab Results:  Recent Labs    04/16/22 0200 04/17/22 0344  WBC 10.6* 10.6*  HGB 14.1 11.9*  HCT 41.3 35.4*  PLT 322 305   BMET Recent Labs    04/16/22 0200 04/17/22 0344  NA 141 139  K 4.3 3.2*  CL 97* 106  CO2 26 24  GLUCOSE 174* 118*  BUN 23 21  CREATININE 1.14 1.22  CALCIUM 11.0* 8.7*   PT/INR No results for input(s): "LABPROT", "INR" in the last 72 hours. ABG No results for input(s): "PHART", "HCO3" in the last 72 hours.  Invalid input(s): "PCO2", "PO2"  Studies/Results: DG Abd Portable 1V  Result Date: 04/16/2022 CLINICAL DATA:  Small bowel obstruction. EXAM: PORTABLE ABDOMEN - 1 VIEW COMPARISON:  April 15, 2022. FINDINGS: Distal tip of nasogastric tube is seen in proximal stomach. Residual contrast is noted in the stomach and proximal duodenum. Mildly dilated small bowel loops are noted concerning for ileus or distal small bowel obstruction. IMPRESSION: Stable small bowel dilatation is noted concerning for distal obstruction or ileus. Electronically Signed   By: Lupita Raider  M.D.   On: 04/16/2022 12:42   DG Abd Portable 1V-Small Bowel Obstruction Protocol-initial, 8 hr delay  Result Date: 04/15/2022 CLINICAL DATA:  SBO PROTOCOL-INITIAL, 8 HR DELAY EXAM: PORTABLE ABDOMEN - 1 VIEW COMPARISON:  X-ray abdomen 04/14/2022 FINDINGS: PO contrast remains within the gastric lumen. Enteric tube overlying the gastric lumen. Small bowel gaseous dilatation. No radio-opaque calculi or other significant radiographic abnormality are seen. IMPRESSION: PO contrast remains within the gastric lumen. Small bowel gaseous dilatation. Electronically Signed   By: Tish Frederickson M.D.   On: 04/15/2022 23:46    Anti-infectives: Anti-infectives (From admission, onward)    Start     Dose/Rate Route Frequency Ordered Stop   04/09/22 1100  Ampicillin-Sulbactam (UNASYN) 3 g in sodium chloride 0.9 % 100 mL IVPB  Status:  Discontinued        3 g 200 mL/hr over 30 Minutes Intravenous Every 8 hours 04/09/22 1045 04/14/22 1419       Assessment/Plan:     Cody [name] is an 61 y.o. male with GERD, newly diagnosed T1DM with now possible ileus vs sbo/psbo   Reviewed the patient's chart, vital signs, labs, and x-rays.  Appears to have a persistent small bowel obstruction despite nasogastric decompression.  There is no progression of contrast on his plain films after small bowel protocol.  This point time recommend exploratory laparotomy for small bowel obstruction.  I discussed this with the patient.  I will call his mother as he is requested and discussed this  with her.  I do not feel any further conservative management beneficial at this point time.  Risk of surgery reviewed.  Risk of bleeding, infection, domino wall complication or hernia, wound complication, bowel injury, injury to other internal organs, DVT risk, a UTI risk, low risk of cardiovascular event or death, and continue medical management which I feel has been exhausted at this point time.  He agrees to proceed.  We will check with his  family as well as he is requested me to do so. Maisie Fus A Toby Breithaupt 04/17/2022 Total time 30 minutes

## 2022-04-17 NOTE — Progress Notes (Signed)
PROGRESS NOTE  Cody [name] MRN:[id number] DOB: 01/13/1961   PCP: Johnson, Deborah B, MD  Patient is from: SNF  DOA: 04/09/2022 LOS: 8  Chief complaints Chief Complaint  Patient presents with   Code Stroke   Hyperglycemia     Brief Narrative / Interim history: 61 yo M with PMH of GERD, allergic rhinitis and tobacco use disorder presents with confusion found to have DKA with newly diagnosed diabetes, acute respiratory failure with hypoxia likely due to aspiration pneumonia/pneumonitis, AKI, encephalopathy and strokelike symptoms.  He was treated with IV insulin and IV fluid.  DKA resolved.  He was transitioned to subcu insulin.  C-peptide low suggesting type 1 diabetes.  CVA work-up negative.  EEG did not show seizure.  Encephalopathy and AKI resolved.  He is on IV Unasyn for possible aspiration pneumonia.  Respiratory failure improving.  Completed antibiotic course.  Slowly weaning of oxygen.  Patient developed abdominal pain, distention and emesis.  KUB concerning for ileus.  CT abdomen and pelvis raises concern for partial SBO.  General surgery consulted on 9/2.  SBO protocol initiated but SBO persisted.  General surgery to take patient for lap chole.   Therapy recommended SNF.  Subjective: Seen and examined earlier this morning.  No major events overnight of this morning.  No complaints.  He denies chest pain, dyspnea, nausea, vomiting or abdominal pain.  NG tube in place.  No bowel movement in 2 days.  Passing some gas.  General surgery to take patient for lap chole. Objective: Vitals:   04/16/22 2325 04/17/22 0400 04/17/22 0406 04/17/22 1100  BP: 110/69 116/71  119/73  Pulse: (!) 110 100 (!) 102 96  Resp: 19 20  18  Temp: 98.9 F (37.2 C) 98.2 F (36.8 C)  98.2 F (36.8 C)  TempSrc: Oral Oral  Oral  SpO2: 94% 90% 92% 94%  Weight:   102.7 kg   Height:        Examination:  GENERAL: No apparent distress.  Nontoxic. HEENT: MMM.  Vision and hearing grossly intact.   NECK: Supple.  No apparent JVD.  RESP:  No IWOB.  Fair aeration bilaterally. CVS:  RRR. Heart sounds normal.  ABD/GI/GU: BS diminished.  Abdomen distended but nontender. MSK/EXT:  Moves extremities. No apparent deformity. No edema.  SKIN: no apparent skin lesion or wound NEURO: Awake and alert. Oriented appropriately.  No apparent focal neuro deficit. PSYCH: Flat affect.  Procedures:  None  Microbiology summarized: COVID-19 PCR nonreactive. Blood cultures NGTD  Assessment and plan: Principal Problem:   DKA (diabetic ketoacidosis) (HCC) Active Problems:   GERD (gastroesophageal reflux disease)   Acute metabolic encephalopathy   Severe sepsis (HCC)   AKI (acute kidney injury) (HCC)   Dehydration   SIRS (systemic inflammatory response syndrome) (HCC)   Allergic rhinitis   Ileus (HCC)  Ileus/possible SBO: Significant distention with intermittent emesis.  CT raises concern for partial SBO/ileus.  SBO protocol initiated.  SBO seems to have persisted despite NG tube -General surgery planning to take patient for lap chole. -NPO.  Continue IVF.  Added KCl for hypokalemia. -Continue IV Protonix.  DM-1 with DKA, hyperglycemia and hyperlipidemia  A1c 11.7%.  C-peptide 0.6.  LDL 151 DKA resolved. Recent Labs  Lab 04/16/22 2004 04/16/22 2325 04/17/22 0359 04/17/22 0814 04/17/22 1009  GLUCAP 158* 129* 110* 124* 103*  -Continue SSI-sensitive every 4 hours while n.p.o. -Further adjustment as appropriate.   Acute kidney injury: In the setting of DKA.  Resolved. Recent Labs      04/09/22 1108 04/09/22 1658 04/10/22 0223 04/11/22 0713 04/12/22 0312 04/13/22 0506 04/14/22 0949 04/15/22 0223 04/16/22 0200 04/17/22 0344  BUN 30* 25* 21 14 15 14 16 23 23 21  CREATININE 1.86* 1.49* 1.18 1.04 0.97 0.91 0.91 1.18 1.14 1.22  -Monitor as needed   Acute metabolic encephalopathy/strokelike symptoms: CVA work-up unrevealing.  EEG unremarkable.  Encephalopathy resolved.  He is oriented  x4. -Reorientation and delirium precautions   Severe sepsis with acute hypoxic respiratory failure due to aspiration pneumonia: POA.  Required up to 10 L at some point.  CT chest with patchy infiltrate.  Likely in the setting of encephalopathy.  TTE with normal LVEF and G1 DD.  Respiratory failure improving.  Procalcitonin and sepsis physiology resolved. -Completed IV Unasyn for 5 days. -Wean off oxygen as able -Encourage incentive spirometry  Sinus tachycardia: HR in 100s.  Improved. -IV metoprolol to 5 mg as needed HR > 120. -IV fluids  GERD: -Continue PPI   Mild hypernatremia/hypercalcemia: Likely from dehydration.  Resolved. -IV fluid as above  Hypokalemia: K3.2.  Mg 2.2. -IV KCl 10 mill equivalent x4 -Added IV KCl to IV fluid as well  Tobacco use disorder -Encourage cessation -Continue nicotine patch  Generalized weakness/physical deconditioning -PT/OT recommended return to SNF for further therapy  Leukocytosis/bandemia: Likely demargination.  Resolving. -Continue monitoring  Obesity Body mass index is 31.58 kg/m.         DVT prophylaxis:  SCD's Start: 04/17/22 1046 SCDs Start: 04/09/22 0317  Code Status: Full code Family Communication: None at bedside Level of care: Med-Surg.  Status is: Inpatient Remains inpatient appropriate because: Newly diagnosed DM-1, aspiration pneumonia and ileus/SBO   Final disposition: SNF Consultants:  Neurology-signed off General surgery  Sch Meds:  Scheduled Meds:  [MAR Hold] atorvastatin  20 mg Oral Daily   [MAR Hold] bisacodyl  10 mg Rectal Daily   Chlorhexidine Gluconate Cloth  6 each Topical Once   And   Chlorhexidine Gluconate Cloth  6 each Topical Once   [MAR Hold] insulin aspart  0-9 Units Subcutaneous Q4H   [MAR Hold] insulin starter kit- pen needles  1 kit Other Once   [MAR Hold] pantoprazole (PROTONIX) IV  40 mg Intravenous Q24H   Continuous Infusions:  [MAR Hold] sodium chloride 10 mL/hr at 04/16/22 2058     ceFAZolin (ANCEF) IV     lactated ringers 1,000 mL with potassium chloride 20 mEq infusion 125 mL/hr at 04/17/22 0813   lactated ringers 10 mL/hr at 04/17/22 1116   [MAR Hold] potassium chloride 10 mEq (04/17/22 0953)   PRN Meds:.[MAR Hold] sodium chloride, [MAR Hold] acetaminophen **OR** [MAR Hold] acetaminophen, [MAR Hold] dextrose, [MAR Hold] metoprolol tartrate, [MAR Hold] nicotine, [MAR Hold] ondansetron (ZOFRAN) IV  Antimicrobials: Anti-infectives (From admission, onward)    Start     Dose/Rate Route Frequency Ordered Stop   04/17/22 1100  ceFAZolin (ANCEF) IVPB 2g/100 mL premix        2 g 200 mL/hr over 30 Minutes Intravenous On call to O.R. 04/17/22 1045 04/18/22 0559   04/09/22 1100  Ampicillin-Sulbactam (UNASYN) 3 g in sodium chloride 0.9 % 100 mL IVPB  Status:  Discontinued        3 g 200 mL/hr over 30 Minutes Intravenous Every 8 hours 04/09/22 1045 04/14/22 1419        I have personally reviewed the following labs and images: CBC: Recent Labs  Lab 04/12/22 0312 04/13/22 0506 04/15/22 0223 04/16/22 0200 04/17/22 0344  WBC 9.0 8.4 17.0* 10.6*   10.6*  NEUTROABS  --   --   --  6.2 5.2  HGB 15.5 15.2 16.3 14.1 11.9*  HCT 42.8 42.2 45.8 41.3 35.4*  MCV 95.3 95.9 96.2 99.3 102.0*  PLT 174 171 317 322 305   BMP &GFR Recent Labs  Lab 04/12/22 0312 04/13/22 0506 04/14/22 0949 04/15/22 0223 04/16/22 0200 04/17/22 0344  NA 142 141 142 139 141 139  K 3.1* 3.4* 4.0 3.7 4.3 3.2*  CL 108 104 108 100 97* 106  CO2 _0 GLUCOSE 236* 167* 162* 183* 174* 118*  BUN _1 CREATININE 0.97 0.91 0.91 1.18 1.14 1.22  CALCIUM 10.1 9.6 9.7 10.6* 11.0* 8.7*  MG 2.2 2.1  --  2.2 2.3 2.2  PHOS 4.7* 4.0 4.1 5.4* 5.5* 2.9   Estimated Creatinine Clearance: 76.6 mL/min (by C-G formula based on SCr of 1.22 mg/dL). Liver & Pancreas: Recent Labs  Lab 04/13/22 0506 04/14/22 0949 04/15/22 0223 04/16/22 0200 04/17/22 0344  AST  --   --  29  --  23   ALT  --   --  27  --  23  ALKPHOS  --   --  73  --  53  BILITOT  --   --  1.2  --  1.3*  PROT  --   --  6.9  --  5.9*  ALBUMIN 3.2* 3.1* 3.3* 3.3* 2.8*   No results for input(s): "LIPASE", "AMYLASE" in the last 168 hours. No results for input(s): "AMMONIA" in the last 168 hours.  Diabetic: No results for input(s): "HGBA1C" in the last 72 hours.  Recent Labs  Lab 04/16/22 2004 04/16/22 2325 04/17/22 0359 04/17/22 0814 04/17/22 1009  GLUCAP 158* 129* 110* 124* 103*   Cardiac Enzymes: No results for input(s): "CKTOTAL", "CKMB", "CKMBINDEX", "TROPONINI" in the last 168 hours. No results for input(s): "PROBNP" in the last 8760 hours. Coagulation Profile: No results for input(s): "INR", "PROTIME" in the last 168 hours.  Thyroid Function Tests: No results for input(s): "TSH", "T4TOTAL", "FREET4", "T3FREE", "THYROIDAB" in the last 72 hours.  Lipid Profile: No results for input(s): "CHOL", "HDL", "LDLCALC", "TRIG", "CHOLHDL", "LDLDIRECT" in the last 72 hours.  Anemia Panel: No results for input(s): "VITAMINB12", "FOLATE", "FERRITIN", "TIBC", "IRON", "RETICCTPCT" in the last 72 hours. Urine analysis:    Component Value Date/Time   COLORURINE YELLOW 04/09/2022 0250   APPEARANCEUR CLEAR 04/09/2022 0250   LABSPEC 1.027 04/09/2022 0250   PHURINE 5.0 04/09/2022 0250   GLUCOSEU >=500 (A) 04/09/2022 0250   HGBUR SMALL (A) 04/09/2022 0250   BILIRUBINUR NEGATIVE 04/09/2022 0250   KETONESUR 80 (A) 04/09/2022 0250   PROTEINUR 100 (A) 04/09/2022 0250   UROBILINOGEN 0.2 12/08/2009 2250   NITRITE NEGATIVE 04/09/2022 0250   LEUKOCYTESUR NEGATIVE 04/09/2022 0250   Sepsis Labs: Invalid input(s): "PROCALCITONIN", "LACTICIDVEN"  Microbiology: Recent Results (from the past 240 hour(s))  Resp Panel by RT-PCR (Flu A&B, Covid) Anterior Nasal Swab     Status: None   Collection Time: 04/09/22  2:53 AM   Specimen: Anterior Nasal Swab  Result Value Ref Range Status   SARS Coronavirus 2 by RT  PCR NEGATIVE NEGATIVE Final    Comment: (NOTE) SARS-CoV-2 target nucleic acids are NOT DETECTED.  The SARS-CoV-2 RNA is generally detectable in upper respiratory specimens during the acute phase of infection. The lowest concentration of SARS-CoV-2 viral copies this assay can detect is 138 copies/mL. A negative result does not preclude  SARS-Cov-2 infection and should not be used as the sole basis for treatment or other patient management decisions. A negative result may occur with  improper specimen collection/handling, submission of specimen other than nasopharyngeal swab, presence of viral mutation(s) within the areas targeted by this assay, and inadequate number of viral copies(<138 copies/mL). A negative result must be combined with clinical observations, patient history, and epidemiological information. The expected result is Negative.  Fact Sheet for Patients:  EntrepreneurPulse.com.au  Fact Sheet for Healthcare Providers:  IncredibleEmployment.be  This test is no t yet approved or cleared by the [place] FDA and  has been authorized for detection and/or diagnosis of SARS-CoV-2 by FDA under an Emergency Use Authorization (EUA). This EUA will remain  in effect (meaning this test can be used) for the duration of the COVID-19 declaration under Section 564(b)(1) of the Act, 21 U.S.C.section 360bbb-3(b)(1), unless the authorization is terminated  or revoked sooner.       Influenza A by PCR NEGATIVE NEGATIVE Final   Influenza B by PCR NEGATIVE NEGATIVE Final    Comment: (NOTE) The Xpert Xpress SARS-CoV-2/FLU/RSV plus assay is intended as an aid in the diagnosis of influenza from Nasopharyngeal swab specimens and should not be used as a sole basis for treatment. Nasal washings and aspirates are unacceptable for Xpert Xpress SARS-CoV-2/FLU/RSV testing.  Fact Sheet for Patients: EntrepreneurPulse.com.au  Fact Sheet for  Healthcare Providers: IncredibleEmployment.be  This test is not yet approved or cleared by the [place] FDA and has been authorized for detection and/or diagnosis of SARS-CoV-2 by FDA under an Emergency Use Authorization (EUA). This EUA will remain in effect (meaning this test can be used) for the duration of the COVID-19 declaration under Section 564(b)(1) of the Act, 21 U.S.C. section 360bbb-3(b)(1), unless the authorization is terminated or revoked.  Performed at [facility], [place] [address] [place], [place] [address]   Culture, blood (Routine X 2) w Reflex to ID Panel     Status: None   Collection Time: 04/09/22 10:29 AM   Specimen: BLOOD  Result Value Ref Range Status   Specimen Description BLOOD SITE NOT SPECIFIED  Final   Special Requests   Final    BOTTLES DRAWN AEROBIC AND ANAEROBIC Blood Culture adequate volume   Culture   Final    NO GROWTH 5 DAYS Performed at [facility], [address], [place], [place] [address]    Report Status 04/14/2022 FINAL  Final  Culture, blood (Routine X 2) w Reflex to ID Panel     Status: None   Collection Time: 04/09/22 10:38 PM   Specimen: BLOOD RIGHT HAND  Result Value Ref Range Status   Specimen Description BLOOD RIGHT HAND  Final   Special Requests   Final    BOTTLES DRAWN AEROBIC AND ANAEROBIC Blood Culture adequate volume   Culture   Final    NO GROWTH 5 DAYS Performed at [facility], [place] [address] [place], [place] [address]    Report Status 04/14/2022 FINAL  Final    Radiology Studies: No results found.    Allessandra Bernardi T. [place]  If 7PM-7AM, please contact night-coverage [email or web address] 04/17/2022, 11:29 AM

## 2022-04-17 NOTE — Anesthesia Procedure Notes (Signed)
Procedure Name: Intubation Date/Time: 04/17/2022 11:45 AM  Performed by: Lelon Perla, CRNAPre-anesthesia Checklist: Patient identified, Emergency Drugs available, Suction available and Patient being monitored Patient Re-evaluated:Patient Re-evaluated prior to induction Oxygen Delivery Method: Circle system utilized Preoxygenation: Pre-oxygenation with 100% oxygen Induction Type: IV induction, Rapid sequence and Cricoid Pressure applied Laryngoscope Size: Glidescope and 4 Grade View: Grade I Tube type: Oral Tube size: 7.5 mm Number of attempts: 1 Airway Equipment and Method: Video-laryngoscopy and Rigid stylet Placement Confirmation: ETT inserted through vocal cords under direct vision, positive ETCO2 and breath sounds checked- equal and bilateral Secured at: 23 cm Tube secured with: Tape Dental Injury: Teeth and Oropharynx as per pre-operative assessment  Difficulty Due To: Difficulty was anticipated, Difficult Airway- due to limited oral opening, Difficult Airway- due to reduced neck mobility and Difficult Airway- due to large tongue

## 2022-04-17 NOTE — H&P (View-Only) (Signed)
 Subjective/Chief Complaint: Patient about the same.  Reports flatus.  Has over a liter of NG tube output.  Still distended.   Objective: Vital signs in last 24 hours: Temp:  [98 F (36.7 C)-98.9 F (37.2 C)] 98.2 F (36.8 C) (09/05 0400) Pulse Rate:  [100-110] 102 (09/05 0406) Resp:  [18-20] 20 (09/05 0400) BP: (110-124)/(69-87) 116/71 (09/05 0400) SpO2:  [90 %-94 %] 92 % (09/05 0406) Weight:  [102.7 kg] 102.7 kg (09/05 0406) Last BM Date : 04/13/22  Intake/Output from previous day: 09/04 0701 - 09/05 0700 In: 2967.7 [I.V.:2967.7] Out: 3905 [Urine:2905; Emesis/NG output:1000] Intake/Output this shift: No intake/output data recorded.   Gen:  Alert, NAD, cooperative Card:  sinus tach on monitor - 120's, no lower extremity edema Pulm:  Normal effort ORA Abd: Soft, protuberant, tympanic, nontender, scarring over umbilicus Skin: warm and dry, no rashes  Psych: A&Ox3  Lab Results:  Recent Labs    04/16/22 0200 04/17/22 0344  WBC 10.6* 10.6*  HGB 14.1 11.9*  HCT 41.3 35.4*  PLT 322 305   BMET Recent Labs    04/16/22 0200 04/17/22 0344  NA 141 139  K 4.3 3.2*  CL 97* 106  CO2 26 24  GLUCOSE 174* 118*  BUN 23 21  CREATININE 1.14 1.22  CALCIUM 11.0* 8.7*   PT/INR No results for input(s): "LABPROT", "INR" in the last 72 hours. ABG No results for input(s): "PHART", "HCO3" in the last 72 hours.  Invalid input(s): "PCO2", "PO2"  Studies/Results: DG Abd Portable 1V  Result Date: 04/16/2022 CLINICAL DATA:  Small bowel obstruction. EXAM: PORTABLE ABDOMEN - 1 VIEW COMPARISON:  April 15, 2022. FINDINGS: Distal tip of nasogastric tube is seen in proximal stomach. Residual contrast is noted in the stomach and proximal duodenum. Mildly dilated small bowel loops are noted concerning for ileus or distal small bowel obstruction. IMPRESSION: Stable small bowel dilatation is noted concerning for distal obstruction or ileus. Electronically Signed   By: James  Green Jr  M.D.   On: 04/16/2022 12:42   DG Abd Portable 1V-Small Bowel Obstruction Protocol-initial, 8 hr delay  Result Date: 04/15/2022 CLINICAL DATA:  SBO PROTOCOL-INITIAL, 8 HR DELAY EXAM: PORTABLE ABDOMEN - 1 VIEW COMPARISON:  X-ray abdomen 04/14/2022 FINDINGS: PO contrast remains within the gastric lumen. Enteric tube overlying the gastric lumen. Small bowel gaseous dilatation. No radio-opaque calculi or other significant radiographic abnormality are seen. IMPRESSION: PO contrast remains within the gastric lumen. Small bowel gaseous dilatation. Electronically Signed   By: Morgane  Naveau M.D.   On: 04/15/2022 23:46    Anti-infectives: Anti-infectives (From admission, onward)    Start     Dose/Rate Route Frequency Ordered Stop   04/09/22 1100  Ampicillin-Sulbactam (UNASYN) 3 g in sodium chloride 0.9 % 100 mL IVPB  Status:  Discontinued        3 g 200 mL/hr over 30 Minutes Intravenous Every 8 hours 04/09/22 1045 04/14/22 1419       Assessment/Plan:     Cody [name] is an 60 y.o. male with GERD, newly diagnosed T1DM with now possible ileus vs sbo/psbo   Reviewed the patient's chart, vital signs, labs, and x-rays.  Appears to have a persistent small bowel obstruction despite nasogastric decompression.  There is no progression of contrast on his plain films after small bowel protocol.  This point time recommend exploratory laparotomy for small bowel obstruction.  I discussed this with the patient.  I will call his mother as he is requested and discussed this   with her.  I do not feel any further conservative management beneficial at this point time.  Risk of surgery reviewed.  Risk of bleeding, infection, domino wall complication or hernia, wound complication, bowel injury, injury to other internal organs, DVT risk, a UTI risk, low risk of cardiovascular event or death, and continue medical management which I feel has been exhausted at this point time.  He agrees to proceed.  We will check with his  family as well as he is requested me to do so. Maisie Fus A Kyliegh Jester 04/17/2022 Total time 30 minutes

## 2022-04-17 NOTE — Progress Notes (Signed)
PT Cancellation Note  Patient Details Name: Cody [name] MRN: [id number] DOB: 27-Feb-1961   Cancelled Treatment:    Reason Eval/Treat Not Completed: Patient at procedure or test/unavailable, pt off unit. Will check back as schedule allows to continue with PT POC.  Lenora Boys. PTA Acute Rehabilitation Services Office: [phone]    Catalina Antigua 04/17/2022, 2:33 PM

## 2022-04-18 ENCOUNTER — Encounter (HOSPITAL_COMMUNITY): Payer: Self-pay | Admitting: Surgery

## 2022-04-18 DIAGNOSIS — E1010 Type 1 diabetes mellitus with ketoacidosis without coma: Secondary | ICD-10-CM | POA: Diagnosis not present

## 2022-04-18 DIAGNOSIS — N179 Acute kidney failure, unspecified: Secondary | ICD-10-CM | POA: Diagnosis not present

## 2022-04-18 DIAGNOSIS — E109 Type 1 diabetes mellitus without complications: Secondary | ICD-10-CM | POA: Diagnosis not present

## 2022-04-18 DIAGNOSIS — G9341 Metabolic encephalopathy: Secondary | ICD-10-CM | POA: Diagnosis not present

## 2022-04-18 LAB — GLUCOSE, CAPILLARY
Glucose-Capillary: 130 mg/dL — ABNORMAL HIGH (ref 70–99)
Glucose-Capillary: 131 mg/dL — ABNORMAL HIGH (ref 70–99)
Glucose-Capillary: 136 mg/dL — ABNORMAL HIGH (ref 70–99)
Glucose-Capillary: 145 mg/dL — ABNORMAL HIGH (ref 70–99)
Glucose-Capillary: 149 mg/dL — ABNORMAL HIGH (ref 70–99)
Glucose-Capillary: 158 mg/dL — ABNORMAL HIGH (ref 70–99)
Glucose-Capillary: 161 mg/dL — ABNORMAL HIGH (ref 70–99)

## 2022-04-18 LAB — SURGICAL PATHOLOGY

## 2022-04-18 LAB — CBC
HCT: 36.7 % — ABNORMAL LOW (ref 39.0–52.0)
Hemoglobin: 12.5 g/dL — ABNORMAL LOW (ref 13.0–17.0)
MCH: 34.3 pg — ABNORMAL HIGH (ref 26.0–34.0)
MCHC: 34.1 g/dL (ref 30.0–36.0)
MCV: 100.8 fL — ABNORMAL HIGH (ref 80.0–100.0)
Platelets: 329 10*3/uL (ref 150–400)
RBC: 3.64 MIL/uL — ABNORMAL LOW (ref 4.22–5.81)
RDW: 12.7 % (ref 11.5–15.5)
WBC: 11.9 10*3/uL — ABNORMAL HIGH (ref 4.0–10.5)
nRBC: 0.4 % — ABNORMAL HIGH (ref 0.0–0.2)

## 2022-04-18 LAB — COMPREHENSIVE METABOLIC PANEL
ALT: 35 U/L (ref 0–44)
AST: 29 U/L (ref 15–41)
Albumin: 2.5 g/dL — ABNORMAL LOW (ref 3.5–5.0)
Alkaline Phosphatase: 50 U/L (ref 38–126)
Anion gap: 7 (ref 5–15)
BUN: 19 mg/dL (ref 8–23)
CO2: 23 mmol/L (ref 22–32)
Calcium: 8.4 mg/dL — ABNORMAL LOW (ref 8.9–10.3)
Chloride: 109 mmol/L (ref 98–111)
Creatinine, Ser: 1.06 mg/dL (ref 0.61–1.24)
GFR, Estimated: >60 mL/min (ref >60)
Glucose, Bld: 138 mg/dL — ABNORMAL HIGH (ref 70–99)
Potassium: 4.3 mmol/L (ref 3.5–5.1)
Sodium: 139 mmol/L (ref 135–145)
Total Bilirubin: 2 mg/dL — ABNORMAL HIGH (ref 0.3–1.2)
Total Protein: 5.5 g/dL — ABNORMAL LOW (ref 6.5–8.1)

## 2022-04-18 LAB — MAGNESIUM: Magnesium: 2 mg/dL (ref 1.7–2.4)

## 2022-04-18 LAB — TRIGLYCERIDES: Triglycerides: 150 mg/dL — ABNORMAL HIGH (ref <150)

## 2022-04-18 LAB — PHOSPHORUS: Phosphorus: 3.2 mg/dL (ref 2.5–4.6)

## 2022-04-18 MED ORDER — CHLORHEXIDINE GLUCONATE CLOTH 2 % EX PADS
6.0000 | MEDICATED_PAD | Freq: Every day | CUTANEOUS | Status: DC
  Administered 2022-04-18 – 2022-05-05 (×18): 6 via TOPICAL

## 2022-04-18 MED ORDER — TRAVASOL 10 % IV SOLN
INTRAVENOUS | Status: AC
  Filled 2022-04-18: qty 684

## 2022-04-18 MED ORDER — SODIUM CHLORIDE 0.9% FLUSH
10.0000 mL | Freq: Two times a day (BID) | INTRAVENOUS | Status: DC
  Administered 2022-04-18 – 2022-04-30 (×24): 10 mL
  Administered 2022-05-01: 20 mL
  Administered 2022-05-01: 10 mL
  Administered 2022-05-02: 23 mL
  Administered 2022-05-02 – 2022-05-06 (×8): 10 mL

## 2022-04-18 MED ORDER — ORAL CARE MOUTH RINSE: 15.0000 mL | OROMUCOSAL | Status: DC | PRN

## 2022-04-18 MED ORDER — ORAL CARE MOUTH RINSE
15.0000 mL | OROMUCOSAL | Status: DC
  Administered 2022-04-18 – 2022-04-21 (×11): 15 mL via OROMUCOSAL

## 2022-04-18 MED ORDER — SODIUM CHLORIDE 0.9% FLUSH
10.0000 mL | INTRAVENOUS | Status: DC | PRN
  Administered 2022-04-23: 10 mL

## 2022-04-18 MED ORDER — ENOXAPARIN SODIUM 40 MG/0.4ML IJ SOSY
40.0000 mg | PREFILLED_SYRINGE | INTRAMUSCULAR | Status: DC
  Administered 2022-04-18 – 2022-04-20 (×3): 40 mg via SUBCUTANEOUS
  Filled 2022-04-18 (×3): qty 0.4

## 2022-04-18 MED ORDER — LACTATED RINGERS IV SOLN
INTRAVENOUS | Status: DC
Start: 2022-04-18 — End: 2022-04-19

## 2022-04-18 NOTE — Progress Notes (Signed)
Initial Nutrition Assessment  DOCUMENTATION CODES:   Not applicable  INTERVENTION:   TPN management per pharmacy Advance diet as medically appropriate  NUTRITION DIAGNOSIS:   Increased nutrient needs related to post-op healing as evidenced by estimated needs.  GOAL:   Patient will meet greater than or equal to 90% of their needs  MONITOR:   Labs, I & O's, Diet advancement, Weight trends  REASON FOR ASSESSMENT:   Consult New TPN/TNA  ASSESSMENT:   61 y.o. male presented to the ED with AMS from assisted living facility. PMH includes GERD. Pt admitted with DKA (new diagnosed DM) and AKI. Later developed an ileus vs SBO, requiring surgery.   9/03 - NGT placed to LIWS 9/05 - Ex lap s/p lysis of adhesions; NGT replaced to LIWS 9/06 - TPN started   Discussed case with RN prior to entering room. RN reports that pt is requesting to eat often. Reports that pt abdomen is very distended.  Pt resting in bed. Pt denies any vomiting, but endorses some nausea. Currently with NGT to LIWS, explained to pt that he will be receiving all of his nutrition via parenteral nutrition.   No weights recorded within the past year to assess for weight loss.  Medications reviewed and include: Doculax, NovoLog,Protonix Labs reviewed: Hgb A1c 11.7%,  24 hr CBG 130-172  NGT output: 900 mL x 24 hours  NUTRITION - FOCUSED PHYSICAL EXAM:  Flowsheet Row Most Recent Value  Orbital Region No depletion  Upper Arm Region No depletion  Thoracic and Lumbar Region No depletion  Buccal Region No depletion  Temple Region No depletion  Clavicle Bone Region No depletion  Clavicle and Acromion Bone Region No depletion  Scapular Bone Region No depletion  Dorsal Hand Mild depletion  Patellar Region No depletion  Anterior Thigh Region No depletion  Posterior Calf Region No depletion  Edema (RD Assessment) None   Diet Order:   Diet Order             Diet NPO time specified  Diet effective now                    EDUCATION NEEDS:   No education needs have been identified at this time  Skin:  Skin Assessment: Skin Integrity Issues: Skin Integrity Issues:: Incisions Incisions: Abdomen  Last BM:  9/1  Height:   Ht Readings from Last 1 Encounters:  04/09/22 5\' 11"  (1.803 m)    Weight:   Wt Readings from Last 1 Encounters:  04/18/22 103.5 kg    BMI:  Body mass index is 31.82 kg/m.  Estimated Nutritional Needs:   Kcal:  2200-2400  Protein:  110-125 grams  Fluid:  >/= 2 L    06/18/22 RD, LDN Clinical Dietitian See [facility] for contact information.

## 2022-04-18 NOTE — Progress Notes (Signed)
PHARMACY - TOTAL PARENTERAL NUTRITION CONSULT NOTE   Indication: Small bowel obstruction/prolonged ileus  Patient Measurements: Height: 5\' 11"  (180.3 cm) Weight: 103.5 kg (228 lb 2.8 oz) IBW/kg (Calculated) : 75.3 TPN AdjBW (KG): 82.3 Body mass index is 31.82 kg/m. Usual Weight: 100-103 kg  Assessment:  61 yo M with newly diagnosed type 1 diabetes. Patient developed ileus 9/1 which then evolved into SBO now s/p ex lap 04/17/22. Pt was eating ~50% of dysphagia/carb diet from 8/28-9/1. NPO since 04/14/22. Anticipate prolonged ileus - pharmacy consulted for TPN.   Glucose / Insulin: BG<180 received dexamethasone x1, 6u SSI in 24h  Electrolytes: K 4.3 (received 77meq/24h in fluids, +81mEq IV); Mg 2.0; CoCa 9.7 other WNL Renal: AKI resolved- Scr 1.06  Hepatic: AST/ALT WNL; Trigs 150; Albumin 2.5 Intake / Output; MIVF: LR @125ml /hr ordered by MD; NG output 43m in 24h; UO 871ml/24h GI Imaging: 9/4 Ab Xray: c/f distal obstruction or ileus 9/3 CT AP: c/f partial or developing SBO   GI Surgeries / Procedures:  9/5 Ex lap, lysis of adhesions (no abscess, no diverticulitis)  Central access: PICC ordered 04/17/22 TPN start date: 04/18/22  Nutritional Goals: Goal TPN rate is 95 mL/hr (provides 129 g of protein, 73g lipid, 296g dextrose, and 2257 kcals per day)  RD Assessment:   pending  Current Nutrition:  NPO and TPN  Plan:  Start TPN at 50 mL/hr at 1800 provides 68g protein, 1188 kcal, providing 52% of needs)  Electrolytes in TPN: Na 80 mEq/L, K 52 mEq/L, Ca 5 mEq/L, Mg 10 mEq/L, and Phos 15 mmol/L. Cl:Ac 1:1 Add standard MVI and trace elements to TPN Continue Sensitive q4h SSI and adjust as needed Add Insulin regular 25u in TPN   Adjust MIVF to LR without K and reduce rate to 60 mL/hr at 1800 Monitor TPN labs daily until stable at goal and then on Mon/Thurs once stable F/u RD recommendations   06/17/22, PharmD Clinical Pharmacist 04/18/2022 7:34 AM

## 2022-04-18 NOTE — Progress Notes (Addendum)
1 Day Post-Op  Subjective: CC: Reports his abdomen feels better compared to before surgery. Mainly pain around his incision as well as some pain in the RLQ and LLQ. No nausea. NGT with bilious output. Reports he is passing flatus (reports he was doing this before surgery). No bm. Has not gotten oob. Voiding - this appears blood tinged in cannister, asked RN to monitor.   Objective: Vital signs in last 24 hours: Temp:  [97.4 F (36.3 C)-98.8 F (37.1 C)] 98.8 F (37.1 C) (09/06 0755) Pulse Rate:  [96-113] 102 (09/06 0755) Resp:  [16-22] 16 (09/06 0755) BP: (100-142)/(66-97) 122/71 (09/06 0755) SpO2:  [89 %-95 %] 93 % (09/06 0755) Weight:  [103.5 kg] 103.5 kg (09/06 0409) Last BM Date : 04/13/22  Intake/Output from previous day: 09/05 0701 - 09/06 0700 In: 2063.2 [I.V.:1727.2; IV Piggyback:336] Out: 1825 [Urine:875; Emesis/NG output:900; Blood:50] Intake/Output this shift: No intake/output data recorded.  PE: Gen:  Alert, NAD, pleasant Abd: Moderate distension but soft, appropriately tender around incision. Some RLQ and LLQ ttp but no rigidity or guarding. Few BS heared but hypoactive. Midline wound with honeycomb dressing in place over staples, cdi. NGT bilious.   Lab Results:  Recent Labs    04/17/22 0344 04/18/22 0456  WBC 10.6* 11.9*  HGB 11.9* 12.5*  HCT 35.4* 36.7*  PLT 305 329   BMET Recent Labs    04/17/22 0344 04/18/22 0456  NA 139 139  K 3.2* 4.3  CL 106 109  CO2 24 23  GLUCOSE 118* 138*  BUN 21 19  CREATININE 1.22 1.06  CALCIUM 8.7* 8.4*   PT/INR No results for input(s): "LABPROT", "INR" in the last 72 hours. CMP     Component Value Date/Time   NA 139 04/18/2022 0456   NA 142 11/15/2017 1646   K 4.3 04/18/2022 0456   CL 109 04/18/2022 0456   CO2 23 04/18/2022 0456   GLUCOSE 138 (H) 04/18/2022 0456   BUN 19 04/18/2022 0456   BUN 15 11/15/2017 1646   CREATININE 1.06 04/18/2022 0456   CREATININE 1.14 09/27/2016 1532   CALCIUM 8.4 (L)  04/18/2022 0456   PROT 5.5 (L) 04/18/2022 0456   PROT 7.3 10/07/2017 0954   ALBUMIN 2.5 (L) 04/18/2022 0456   ALBUMIN 4.4 10/07/2017 0954   AST 29 04/18/2022 0456   ALT 35 04/18/2022 0456   ALKPHOS 50 04/18/2022 0456   BILITOT 2.0 (H) 04/18/2022 0456   BILITOT 0.6 10/07/2017 0954   GFRNONAA >60 04/18/2022 0456   GFRNONAA 71 09/27/2016 1532   GFRAA >60 01/18/2018 0526   GFRAA 82 09/27/2016 1532   Lipase  No results found for: "LIPASE"  Studies/Results: [place] EKG SITE RITE  Result Date: 04/17/2022 If Site Rite image not attached, placement could not be confirmed due to current cardiac rhythm.  DG Abd Portable 1V  Result Date: 04/16/2022 CLINICAL DATA:  Small bowel obstruction. EXAM: PORTABLE ABDOMEN - 1 VIEW COMPARISON:  April 15, 2022. FINDINGS: Distal tip of nasogastric tube is seen in proximal stomach. Residual contrast is noted in the stomach and proximal duodenum. Mildly dilated small bowel loops are noted concerning for ileus or distal small bowel obstruction. IMPRESSION: Stable small bowel dilatation is noted concerning for distal obstruction or ileus. Electronically Signed   By: Lupita Raider M.D.   On: 04/16/2022 12:42    Anti-infectives: Anti-infectives (From admission, onward)    Start     Dose/Rate Route Frequency Ordered Stop   04/17/22 1100  ceFAZolin (ANCEF) IVPB 2g/100 mL premix        2 g 200 mL/hr over 30 Minutes Intravenous On call to O.R. 04/17/22 1045 04/17/22 1150   04/09/22 1100  Ampicillin-Sulbactam (UNASYN) 3 g in sodium chloride 0.9 % 100 mL IVPB  Status:  Discontinued        3 g 200 mL/hr over 30 Minutes Intravenous Every 8 hours 04/09/22 1045 04/14/22 1419        Assessment/Plan POD 1 s/p Exploratory laparotomy, lysis of adhesions for SBO by Dr. Luisa Hart, 04/17/22 - Cont NGT to LIWS, AROBF - Cont TPN - Bx Mesentery of SB reassuring with benign dense fibrovascular stroma compatible with an adhesion - Mobilize for bowel function, PT. Okay with NGT  clamped for mobilization.  - Pulm toilet  FEN - NPO, NGT, TPN, IVF per TRH VTE - SCDs, Lovenox ID - None currently.   - Per TRH -  New dx DM AKI - resolved PNA - completed abx    LOS: 9 days    Jacinto Halim , [facility] 04/18/2022, 9:34 AM Please see Amion for pager number during day hours 7:00am-4:30pm

## 2022-04-18 NOTE — TOC Progression Note (Signed)
Transition of Care [facility]) - Progression Note    Patient Details  Name: Cody [name] MRN: [id number] Date of Birth: [birth year]  Transition of [facility]) CM/SW Contact  Delilah Shan, LCSWA Phone Number: 04/18/2022, 12:36 PM  Clinical Narrative:     Plan to return to [facility] when medically ready for dc. CSW will continue to follow and assist with patients dc planning needs.  Expected Discharge Plan: Skilled Nursing Facility Barriers to Discharge: Continued Medical Work up  Expected Discharge Plan and Services Expected Discharge Plan: Skilled Nursing Facility In-house Referral: Clinical Social Work     Living arrangements for the past 2 months: Skilled Nursing Facility                                       Social Determinants of Health (SDOH) Interventions    Readmission Risk Interventions     No data to display

## 2022-04-18 NOTE — Progress Notes (Signed)
Physical Therapy Treatment Patient Details Name: Cody [name] MRN: [id number] DOB: 20-Jan-1961 Today's Date: 04/18/2022   History of Present Illness 61 y.o. male presents to [facility] hospital on 04/09/2022 with AMS, found to be in DKA with newly diagnosed type 1 diabetes. Pt developed severe sepsis 2/2 with acute hypoxic respiratory failure 2/2 aspiration PNA, as well as ileus during admission. Pt underwent ex-lap with lysis of adhesions on 04/17/2022. PMH includes GERD, OA.    PT Comments    Pt is limited by pain this session 2/2 ex-lap yesterday. Pt currently benefits from physical assistance to perform bed mobility and transfer, declining ambulation attempts due to pain. Pt will benefit from aggressive mobilization in an effort to reduce pain and to aide in a return to the pt's PLOF. PT continues to recommend SNF placement at this time. Goals and POC updated according to patient's current level of function and progress this admission.  Recommendations for follow up therapy are one component of a multi-disciplinary discharge planning process, led by the attending physician.  Recommendations may be updated based on patient status, additional functional criteria and insurance authorization.  Follow Up Recommendations  Skilled nursing-short term rehab (<3 hours/day) Can patient physically be transported by private vehicle: No   Assistance Recommended at Discharge Frequent or constant Supervision/Assistance  Patient can return home with the following A lot of help with walking and/or transfers;A lot of help with bathing/dressing/bathroom;Help with stairs or ramp for entrance;Assist for transportation;Assistance with cooking/housework   Equipment Recommendations  None recommended by PT    Recommendations for Other Services       Precautions / Restrictions Precautions Precautions: Fall Restrictions Weight Bearing Restrictions: No     Mobility  Bed Mobility Overal bed mobility: Needs  Assistance Bed Mobility: Rolling, Sidelying to Sit Rolling: Min assist Sidelying to sit: Min assist       General bed mobility comments: cues for log roll    Transfers Overall transfer level: Needs assistance Equipment used: Rolling walker (2 wheels) Transfers: Sit to/from Stand, Bed to chair/wheelchair/BSC Sit to Stand: Min assist, From elevated surface   Step pivot transfers: Min guard       General transfer comment: increased trunk flexion, PT tactile cues to facilitate rocking    Ambulation/Gait                   Stairs             Wheelchair Mobility    Modified Rankin (Stroke Patients Only)       Balance Overall balance assessment: Needs assistance Sitting-balance support: No upper extremity supported, Feet supported Sitting balance-Leahy Scale: Fair     Standing balance support: Bilateral upper extremity supported, Reliant on assistive device for balance Standing balance-Leahy Scale: Poor                              Cognition Arousal/Alertness: Awake/alert Behavior During Therapy: Flat affect Overall Cognitive Status: Impaired/Different from baseline Area of Impairment: Problem solving                             Problem Solving: Slow processing          Exercises      General Comments General comments (skin integrity, edema, etc.): mild tachycardia into low 100s, desats with attempts to wean from 3L O2      Pertinent Vitals/Pain Pain Assessment Pain  Assessment: 0-10 Pain Score: 8  Pain Location: abdomen Pain Descriptors / Indicators: Sore Pain Intervention(s): Monitored during session    Home Living                          Prior Function            PT Goals (current goals can now be found in the care plan section) Acute Rehab PT Goals Patient Stated Goal: to reduce pain PT Goal Formulation: With patient Time For Goal Achievement: 05/02/22 Potential to Achieve Goals:  Fair Progress towards PT goals: Progressing toward goals    Frequency    Min 3X/week      PT Plan Current plan remains appropriate    Co-evaluation              AM-PAC PT "6 Clicks" Mobility   Outcome Measure  Help needed turning from your back to your side while in a flat bed without using bedrails?: A Little Help needed moving from lying on your back to sitting on the side of a flat bed without using bedrails?: A Lot Help needed moving to and from a bed to a chair (including a wheelchair)?: A Little Help needed standing up from a chair using your arms (e.g., wheelchair or bedside chair)?: A Little Help needed to walk in hospital room?: Total Help needed climbing 3-5 steps with a railing? : Total 6 Click Score: 13    End of Session Equipment Utilized During Treatment: Oxygen Activity Tolerance: Patient limited by pain Patient left: in chair;with call bell/phone within reach;with chair alarm set Nurse Communication: Mobility status PT Visit Diagnosis: Unsteadiness on feet (R26.81);Muscle weakness (generalized) (M62.81)     Time: 4665-9935 PT Time Calculation (min) (ACUTE ONLY): 30 min  Charges:  $Therapeutic Activity: 23-37 mins                     Arlyss Gandy, PT, DPT Acute Rehabilitation Office [phone]    Arlyss Gandy 04/18/2022, 11:32 AM

## 2022-04-18 NOTE — Progress Notes (Signed)
PROGRESS NOTE  Cody [name] TTS:[id number] DOB: 11-22-1960   PCP: Ladell Pier, MD  Patient is from: SNF  DOA: 04/09/2022 LOS: 9  Chief complaints Chief Complaint  Patient presents with   Code Stroke   Hyperglycemia     Brief Narrative / Interim history: 61 yo M with PMH of GERD, allergic rhinitis and tobacco use disorder presents with confusion found to have DKA with newly diagnosed diabetes, acute respiratory failure with hypoxia likely due to aspiration pneumonia/pneumonitis, AKI, encephalopathy and strokelike symptoms.  He was treated with IV insulin and IV fluid.  DKA resolved.  He was transitioned to subcu insulin.  C-peptide low suggesting type 1 diabetes.  CVA work-up negative.  EEG did not show seizure.  Encephalopathy and AKI resolved.  He is on IV Unasyn for possible aspiration pneumonia.  Respiratory failure improving.  Completed antibiotic course.  Slowly weaning of oxygen.  Patient developed abdominal pain, distention and emesis.  KUB concerning for ileus.  CT abdomen and pelvis raises concern for partial SBO.  General surgery consulted on 9/2.  SBO protocol initiated but SBO persisted.  Underwent ex lap with lysis of adhesions by Dr. Brantley Stage on 9/5.  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  Started on TPN.  Therapy recommended SNF.  Subjective: Seen and examined earlier this morning.  No major events overnight of this morning.  No complaints other than "a little bit" belly pain.  Reports passing gas.  Denies chest pain or dyspnea.  Objective: Vitals:   04/18/22 0001 04/18/22 0409 04/18/22 0755 04/18/22 1142  BP: 115/75 114/71 122/71 121/77  Pulse: 100 98 (!) 102 99  Resp:  '18 16 18  ' Temp: 98.7 F (37.1 C) 98.3 F (36.8 C) 98.8 F (37.1 C) 98.7 F (37.1 C)  TempSrc: Oral Oral Oral Oral  SpO2: 95% 95% 93% 95%  Weight:  103.5 kg    Height:        Examination:  GENERAL: No apparent distress.   Nontoxic. HEENT: MMM.  Vision and hearing grossly intact.  NECK: Supple.  No apparent JVD.  RESP:  No IWOB.  Fair aeration bilaterally. CVS:  RRR. Heart sounds normal.  ABD/GI/GU: BS diminished.  Abdomen distended but nontender. MSK/EXT:  Moves extremities. No apparent deformity. No edema.  SKIN: no apparent skin lesion or wound NEURO: Awake and alert. Oriented appropriately.  No apparent focal neuro deficit. PSYCH: Flat affect.  Procedures:  9/5-ex lap with lysis of adhesions by Dr. Brantley Stage on 9/5.  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    Microbiology summarized: COVID-19 PCR nonreactive. Blood cultures NGTD  Assessment and plan: Principal Problem:   DKA (diabetic ketoacidosis) ([place]) Active Problems:   GERD (gastroesophageal reflux disease)   Acute metabolic encephalopathy   Severe sepsis (HCC)   AKI (acute kidney injury) (HCC)   Dehydration   SIRS (systemic inflammatory response syndrome) (HCC)   Allergic rhinitis   Ileus (HCC)  Ileus/possible SBO: Significant distention with intermittent emesis.  CT raises concern for partial SBO/ileus.  SBO protocol initiated.  SBO seems to have persisted despite NG tube -ex lap with lysis of adhesions by Dr. Brantley Stage on 9/5.   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   -TPN started 9/6. -NPO. -Continue IV Protonix.  DM-1 with DKA, hyperglycemia and hyperlipidemia  A1c 11.7%.  C-peptide 0.6.  LDL 151 DKA resolved. Recent Labs  Lab 04/17/22 2004 04/18/22 0000 04/18/22 0407  04/18/22 0756 04/18/22 1144  GLUCAP 159* 145* 131* 130* 149*  -Continue SSI-sensitive every 4 hours while n.p.o. -Further adjustment as appropriate.   Acute kidney injury: In the setting of DKA.  Resolved. Recent Labs    04/09/22 1658 04/10/22 0223 04/11/22 0713 04/12/22 0312 04/13/22 0506 04/14/22 0949 04/15/22 0223 04/16/22 0200 04/17/22 0344  04/18/22 0456  BUN 25* '21 14 15 14 16 23 23 21 19  ' CREATININE 1.49* 1.18 1.04 0.97 0.91 0.91 1.18 1.14 1.22 1.06  -Monitor as needed   Acute metabolic encephalopathy/strokelike symptoms: CVA work-up unrevealing.  EEG unremarkable.  Encephalopathy resolved.  He is oriented x4. -Reorientation and delirium precautions   Severe sepsis with acute hypoxic respiratory failure due to aspiration pneumonia: POA.  Required up to 10 L at some point.  CT chest with patchy infiltrate.  Likely in the setting of encephalopathy.  TTE with normal LVEF and G1 DD.  Respiratory failure improving.  Procalcitonin and sepsis physiology resolved. -Completed IV Unasyn for 5 days. -Wean off oxygen as able -Encourage incentive spirometry  Sinus tachycardia: HR in 100s.  Improved. -IV metoprolol to 5 mg as needed HR > 120.  GERD: -Continue PPI   Mild hypernatremia/hypercalcemia: Likely from dehydration.  Resolved.  Hypokalemia: Resolved.  Tobacco use disorder -Encourage cessation -Continue nicotine patch  Generalized weakness/physical deconditioning -PT/OT recommended return to SNF for further therapy  Leukocytosis/bandemia: Likely demargination.  Resolving. -Continue monitoring  Obesity Body mass index is 31.82 kg/m.         DVT prophylaxis:  enoxaparin (LOVENOX) injection 40 mg Start: 04/18/22 1030 SCDs Start: 04/09/22 0317  Code Status: Full code Family Communication: None at bedside Level of care: Med-Surg.  Status is: Inpatient Remains inpatient appropriate because: Newly diagnosed DM-1, aspiration pneumonia and ileus/SBO   Final disposition: SNF Consultants:  Neurology-signed off General surgery  Sch Meds:  Scheduled Meds:  atorvastatin  20 mg Oral Daily   bisacodyl  10 mg Rectal Daily   Chlorhexidine Gluconate Cloth  6 each Topical Daily   enoxaparin (LOVENOX) injection  40 mg Subcutaneous Q24H   insulin aspart  0-9 Units Subcutaneous Q4H   insulin starter kit- pen needles   1 kit Other Once   mouth rinse  15 mL Mouth Rinse 4 times per day   pantoprazole (PROTONIX) IV  40 mg Intravenous Q24H   sodium chloride flush  10-40 mL Intracatheter Q12H   Continuous Infusions:  sodium chloride 10 mL/hr at 04/16/22 2058   acetaminophen 1,000 mg (04/18/22 0906)   lactated ringers 1,000 mL with potassium chloride 20 mEq infusion 125 mL/hr at 04/18/22 1249   lactated ringers     TPN ADULT (ION)     PRN Meds:.sodium chloride, dextrose, metoprolol tartrate, morphine injection, nicotine, ondansetron (ZOFRAN) IV, mouth rinse, sodium chloride flush  Antimicrobials: Anti-infectives (From admission, onward)    Start     Dose/Rate Route Frequency Ordered Stop   04/17/22 1100  ceFAZolin (ANCEF) IVPB 2g/100 mL premix        2 g 200 mL/hr over 30 Minutes Intravenous On call to O.R. 04/17/22 1045 04/17/22 1150   04/09/22 1100  Ampicillin-Sulbactam (UNASYN) 3 g in sodium chloride 0.9 % 100 mL IVPB  Status:  Discontinued        3 g 200 mL/hr over 30 Minutes Intravenous Every 8 hours 04/09/22 1045 04/14/22 1419        I have personally reviewed the following labs and images: CBC: Recent Labs  Lab 04/13/22 0506 04/15/22 0223  04/16/22 0200 04/17/22 0344 04/18/22 0456  WBC 8.4 17.0* 10.6* 10.6* 11.9*  NEUTROABS  --   --  6.2 5.2  --   HGB 15.2 16.3 14.1 11.9* 12.5*  HCT 42.2 45.8 41.3 35.4* 36.7*  MCV 95.9 96.2 99.3 102.0* 100.8*  PLT 171 317 322 305 329   BMP &GFR Recent Labs  Lab 04/13/22 0506 04/14/22 0949 04/15/22 0223 04/16/22 0200 04/17/22 0344 04/18/22 0456  NA 141 142 139 141 139 139  K 3.4* 4.0 3.7 4.3 3.2* 4.3  CL 104 108 100 97* 106 109  CO2 '27 26 28 26 24 23  ' GLUCOSE 167* 162* 183* 174* 118* 138*  BUN '14 16 23 23 21 19  ' CREATININE 0.91 0.91 1.18 1.14 1.22 1.06  CALCIUM 9.6 9.7 10.6* 11.0* 8.7* 8.4*  MG 2.1  --  2.2 2.3 2.2 2.0  PHOS 4.0 4.1 5.4* 5.5* 2.9 3.2   Estimated Creatinine Clearance: 88.5 mL/min (by C-G formula based on SCr of 1.06  mg/dL). Liver & Pancreas: Recent Labs  Lab 04/14/22 0949 04/15/22 0223 04/16/22 0200 04/17/22 0344 04/18/22 0456  AST  --  29  --  23 29  ALT  --  27  --  23 35  ALKPHOS  --  73  --  53 50  BILITOT  --  1.2  --  1.3* 2.0*  PROT  --  6.9  --  5.9* 5.5*  ALBUMIN 3.1* 3.3* 3.3* 2.8* 2.5*   No results for input(s): "LIPASE", "AMYLASE" in the last 168 hours. No results for input(s): "AMMONIA" in the last 168 hours.  Diabetic: No results for input(s): "HGBA1C" in the last 72 hours.  Recent Labs  Lab 04/17/22 2004 04/18/22 0000 04/18/22 0407 04/18/22 0756 04/18/22 1144  GLUCAP 159* 145* 131* 130* 149*   Cardiac Enzymes: No results for input(s): "CKTOTAL", "CKMB", "CKMBINDEX", "TROPONINI" in the last 168 hours. No results for input(s): "PROBNP" in the last 8760 hours. Coagulation Profile: No results for input(s): "INR", "PROTIME" in the last 168 hours.  Thyroid Function Tests: No results for input(s): "TSH", "T4TOTAL", "FREET4", "T3FREE", "THYROIDAB" in the last 72 hours.  Lipid Profile: Recent Labs    04/18/22 0456  TRIG 150*    Anemia Panel: No results for input(s): "VITAMINB12", "FOLATE", "FERRITIN", "TIBC", "IRON", "RETICCTPCT" in the last 72 hours. Urine analysis:    Component Value Date/Time   COLORURINE YELLOW 04/09/2022 0250   APPEARANCEUR CLEAR 04/09/2022 0250   LABSPEC 1.027 04/09/2022 0250   PHURINE 5.0 04/09/2022 0250   GLUCOSEU >=500 (A) 04/09/2022 0250   HGBUR SMALL (A) 04/09/2022 0250   BILIRUBINUR NEGATIVE 04/09/2022 0250   KETONESUR 80 (A) 04/09/2022 0250   PROTEINUR 100 (A) 04/09/2022 0250   UROBILINOGEN 0.2 12/08/2009 2250   NITRITE NEGATIVE 04/09/2022 0250   LEUKOCYTESUR NEGATIVE 04/09/2022 0250   Sepsis Labs: Invalid input(s): "PROCALCITONIN", "LACTICIDVEN"  Microbiology: Recent Results (from the past 240 hour(s))  Resp Panel by RT-PCR (Flu A&B, Covid) Anterior Nasal Swab     Status: None   Collection Time: 04/09/22  2:53 AM    Specimen: Anterior Nasal Swab  Result Value Ref Range Status   SARS Coronavirus 2 by RT PCR NEGATIVE NEGATIVE Final    Comment: (NOTE) SARS-CoV-2 target nucleic acids are NOT DETECTED.  The SARS-CoV-2 RNA is generally detectable in upper respiratory specimens during the acute phase of infection. The lowest concentration of SARS-CoV-2 viral copies this assay can detect is 138 copies/mL. A negative result does not preclude SARS-Cov-2  infection and should not be used as the sole basis for treatment or other patient management decisions. A negative result may occur with  improper specimen collection/handling, submission of specimen other than nasopharyngeal swab, presence of viral mutation(s) within the areas targeted by this assay, and inadequate number of viral copies(<138 copies/mL). A negative result must be combined with clinical observations, patient history, and epidemiological information. The expected result is Negative.  Fact Sheet for Patients:  EntrepreneurPulse.com.au  Fact Sheet for Healthcare Providers:  IncredibleEmployment.be  This test is no t yet approved or cleared by the [place] FDA and  has been authorized for detection and/or diagnosis of SARS-CoV-2 by FDA under an Emergency Use Authorization (EUA). This EUA will remain  in effect (meaning this test can be used) for the duration of the COVID-19 declaration under Section 564(b)(1) of the Act, 21 U.S.C.section 360bbb-3(b)(1), unless the authorization is terminated  or revoked sooner.       Influenza A by PCR NEGATIVE NEGATIVE Final   Influenza B by PCR NEGATIVE NEGATIVE Final    Comment: (NOTE) The Xpert Xpress SARS-CoV-2/FLU/RSV plus assay is intended as an aid in the diagnosis of influenza from Nasopharyngeal swab specimens and should not be used as a sole basis for treatment. Nasal washings and aspirates are unacceptable for Xpert Xpress  SARS-CoV-2/FLU/RSV testing.  Fact Sheet for Patients: EntrepreneurPulse.com.au  Fact Sheet for Healthcare Providers: IncredibleEmployment.be  This test is not yet approved or cleared by the [place] FDA and has been authorized for detection and/or diagnosis of SARS-CoV-2 by FDA under an Emergency Use Authorization (EUA). This EUA will remain in effect (meaning this test can be used) for the duration of the COVID-19 declaration under Section 564(b)(1) of the Act, 21 U.S.C. section 360bbb-3(b)(1), unless the authorization is terminated or revoked.  Performed at [facility], [place] [address] [place], [place] [address]   Culture, blood (Routine X 2) w Reflex to ID Panel     Status: None   Collection Time: 04/09/22 10:29 AM   Specimen: BLOOD  Result Value Ref Range Status   Specimen Description BLOOD SITE NOT SPECIFIED  Final   Special Requests   Final    BOTTLES DRAWN AEROBIC AND ANAEROBIC Blood Culture adequate volume   Culture   Final    NO GROWTH 5 DAYS Performed at [facility], [address], [place], [place] [address]    Report Status 04/14/2022 FINAL  Final  Culture, blood (Routine X 2) w Reflex to ID Panel     Status: None   Collection Time: 04/09/22 10:38 PM   Specimen: BLOOD RIGHT HAND  Result Value Ref Range Status   Specimen Description BLOOD RIGHT HAND  Final   Special Requests   Final    BOTTLES DRAWN AEROBIC AND ANAEROBIC Blood Culture adequate volume   Culture   Final    NO GROWTH 5 DAYS Performed at [facility], [place] [address] [place], [place] [address]    Report Status 04/14/2022 FINAL  Final    Radiology Studies: [place] EKG SITE RITE  Result Date: 04/17/2022 If Site Rite image not attached, placement could not be confirmed due to current cardiac rhythm.     Dirk Vanaman T. Puryear  If 7PM-7AM, please contact night-coverage [email or web address] 04/18/2022, 2:12 PM

## 2022-04-19 DIAGNOSIS — N179 Acute kidney failure, unspecified: Secondary | ICD-10-CM | POA: Diagnosis not present

## 2022-04-19 DIAGNOSIS — E109 Type 1 diabetes mellitus without complications: Secondary | ICD-10-CM | POA: Diagnosis not present

## 2022-04-19 DIAGNOSIS — D62 Acute posthemorrhagic anemia: Secondary | ICD-10-CM

## 2022-04-19 DIAGNOSIS — G9341 Metabolic encephalopathy: Secondary | ICD-10-CM | POA: Diagnosis not present

## 2022-04-19 DIAGNOSIS — E1010 Type 1 diabetes mellitus with ketoacidosis without coma: Secondary | ICD-10-CM | POA: Diagnosis not present

## 2022-04-19 LAB — COMPREHENSIVE METABOLIC PANEL
ALT: 36 U/L (ref 0–44)
AST: 27 U/L (ref 15–41)
Albumin: 2.2 g/dL — ABNORMAL LOW (ref 3.5–5.0)
Alkaline Phosphatase: 47 U/L (ref 38–126)
Anion gap: 7 (ref 5–15)
BUN: 15 mg/dL (ref 8–23)
CO2: 25 mmol/L (ref 22–32)
Calcium: 8 mg/dL — ABNORMAL LOW (ref 8.9–10.3)
Chloride: 104 mmol/L (ref 98–111)
Creatinine, Ser: 1.03 mg/dL (ref 0.61–1.24)
GFR, Estimated: >60 mL/min (ref >60)
Glucose, Bld: 160 mg/dL — ABNORMAL HIGH (ref 70–99)
Potassium: 3.5 mmol/L (ref 3.5–5.1)
Sodium: 136 mmol/L (ref 135–145)
Total Bilirubin: 1.4 mg/dL — ABNORMAL HIGH (ref 0.3–1.2)
Total Protein: 5.2 g/dL — ABNORMAL LOW (ref 6.5–8.1)

## 2022-04-19 LAB — CBC
HCT: 29.5 % — ABNORMAL LOW (ref 39.0–52.0)
HCT: 28.2 % — ABNORMAL LOW (ref 39.0–52.0)
Hemoglobin: 10 g/dL — ABNORMAL LOW (ref 13.0–17.0)
Hemoglobin: 9.4 g/dL — ABNORMAL LOW (ref 13.0–17.0)
MCH: 34.6 pg — ABNORMAL HIGH (ref 26.0–34.0)
MCH: 35.1 pg — ABNORMAL HIGH (ref 26.0–34.0)
MCHC: 33.9 g/dL (ref 30.0–36.0)
MCHC: 33.3 g/dL (ref 30.0–36.0)
MCV: 102.1 fL — ABNORMAL HIGH (ref 80.0–100.0)
MCV: 105.2 fL — ABNORMAL HIGH (ref 80.0–100.0)
Platelets: 306 10*3/uL (ref 150–400)
Platelets: 308 10*3/uL (ref 150–400)
RBC: 2.89 MIL/uL — ABNORMAL LOW (ref 4.22–5.81)
RBC: 2.68 MIL/uL — ABNORMAL LOW (ref 4.22–5.81)
RDW: 13 % (ref 11.5–15.5)
RDW: 13 % (ref 11.5–15.5)
WBC: 14.3 10*3/uL — ABNORMAL HIGH (ref 4.0–10.5)
WBC: 11.7 10*3/uL — ABNORMAL HIGH (ref 4.0–10.5)
nRBC: 0.8 % — ABNORMAL HIGH (ref 0.0–0.2)
nRBC: 0.7 % — ABNORMAL HIGH (ref 0.0–0.2)

## 2022-04-19 LAB — URINALYSIS, ROUTINE W REFLEX MICROSCOPIC
Bacteria, UA: NONE SEEN
Bilirubin Urine: NEGATIVE
Glucose, UA: >=500 mg/dL — AB
Hgb urine dipstick: NEGATIVE
Ketones, ur: NEGATIVE mg/dL
Leukocytes,Ua: NEGATIVE
Nitrite: NEGATIVE
Protein, ur: NEGATIVE mg/dL
Specific Gravity, Urine: 1.02 (ref 1.005–1.030)
pH: 6 (ref 5.0–8.0)

## 2022-04-19 LAB — PHOSPHORUS: Phosphorus: 2.2 mg/dL — ABNORMAL LOW (ref 2.5–4.6)

## 2022-04-19 LAB — GLUCOSE, CAPILLARY
Glucose-Capillary: 144 mg/dL — ABNORMAL HIGH (ref 70–99)
Glucose-Capillary: 150 mg/dL — ABNORMAL HIGH (ref 70–99)
Glucose-Capillary: 161 mg/dL — ABNORMAL HIGH (ref 70–99)
Glucose-Capillary: 153 mg/dL — ABNORMAL HIGH (ref 70–99)
Glucose-Capillary: 167 mg/dL — ABNORMAL HIGH (ref 70–99)

## 2022-04-19 LAB — TRIGLYCERIDES: Triglycerides: 218 mg/dL — ABNORMAL HIGH (ref <150)

## 2022-04-19 LAB — MAGNESIUM: Magnesium: 2.2 mg/dL (ref 1.7–2.4)

## 2022-04-19 MED ORDER — POTASSIUM PHOSPHATES 15 MMOLE/5ML IV SOLN
30.0000 mmol | Freq: Once | INTRAVENOUS | Status: AC
  Administered 2022-04-19: 30 mmol via INTRAVENOUS
  Filled 2022-04-19: qty 10

## 2022-04-19 MED ORDER — TRAVASOL 10 % IV SOLN
INTRAVENOUS | Status: DC
  Filled 2022-04-19: qty 1231.2

## 2022-04-19 MED ORDER — TRAVASOL 10 % IV SOLN
INTRAVENOUS | Status: AC
  Filled 2022-04-19: qty 1299.6

## 2022-04-19 MED ORDER — INSULIN ASPART 100 UNIT/ML IJ SOLN
0.0000 [IU] | Freq: Four times a day (QID) | INTRAMUSCULAR | Status: DC
  Administered 2022-04-19 – 2022-04-20 (×4): 2 [IU] via SUBCUTANEOUS

## 2022-04-19 MED ORDER — LACTATED RINGERS IV SOLN: INTRAVENOUS | Status: AC

## 2022-04-19 MED ORDER — ATORVASTATIN CALCIUM 10 MG PO TABS
20.0000 mg | ORAL_TABLET | Freq: Every day | ORAL | Status: DC
  Administered 2022-04-20: 20 mg
  Filled 2022-04-19: qty 2

## 2022-04-19 NOTE — Progress Notes (Signed)
2 Days Post-Op  Subjective: CC: Patient reports continued pain in his abdomen, mainly around his incision and on the left lower and right lower portions of his abdomen. Feels this is better from yesterday. No nausea. NGT output bilious. ~500cc in cannister. Continues to pass flatus. No bm. Urine still orange/blood tinged. Denies any urinary symptoms. Mobilized a little more with therapies today and is up in the chair currently.   Objective: Vital signs in last 24 hours: Temp:  [98.7 F (37.1 C)-99.6 F (37.6 C)] 99.1 F (37.3 C) (09/07 0417) Pulse Rate:  [99-107] 105 (09/07 0417) Resp:  [16-18] 18 (09/07 0417) BP: (110-124)/(63-77) 124/63 (09/07 0417) SpO2:  [90 %-95 %] 90 % (09/06 2350) Last BM Date : 04/13/22  Intake/Output from previous day: 09/06 0701 - 09/07 0700 In: 2307.7 [I.V.:2307.7] Out: 800 [Urine:500; Emesis/NG output:300] Intake/Output this shift: No intake/output data recorded.  PE: Gen:  Alert, NAD, pleasant Abd: Moderate distension but still soft, appropriately tender around incision. Some RLQ and LLQ ttp but no rigidity or guarding - stable from yesterday. Few BS heared but hypoactive. Midline wound with honeycomb dressing in place over staples with a very small amount of dried ss like drainage over the inferior portion of dressing without signs of active drainage. Dressing is otherwise cdi. NGT bilious.   Lab Results:  Recent Labs    04/18/22 0456 04/19/22 0045  WBC 11.9* 14.3*  HGB 12.5* 10.0*  HCT 36.7* 29.5*  PLT 329 306   BMET Recent Labs    04/18/22 0456 04/19/22 0045  NA 139 136  K 4.3 3.5  CL 109 104  CO2 23 25  GLUCOSE 138* 160*  BUN 19 15  CREATININE 1.06 1.03  CALCIUM 8.4* 8.0*   PT/INR No results for input(s): "LABPROT", "INR" in the last 72 hours. CMP     Component Value Date/Time   NA 136 04/19/2022 0045   NA 142 11/15/2017 1646   K 3.5 04/19/2022 0045   CL 104 04/19/2022 0045   CO2 25 04/19/2022 0045   GLUCOSE 160 (H)  04/19/2022 0045   BUN 15 04/19/2022 0045   BUN 15 11/15/2017 1646   CREATININE 1.03 04/19/2022 0045   CREATININE 1.14 09/27/2016 1532   CALCIUM 8.0 (L) 04/19/2022 0045   PROT 5.2 (L) 04/19/2022 0045   PROT 7.3 10/07/2017 0954   ALBUMIN 2.2 (L) 04/19/2022 0045   ALBUMIN 4.4 10/07/2017 0954   AST 27 04/19/2022 0045   ALT 36 04/19/2022 0045   ALKPHOS 47 04/19/2022 0045   BILITOT 1.4 (H) 04/19/2022 0045   BILITOT 0.6 10/07/2017 0954   GFRNONAA >60 04/19/2022 0045   GFRNONAA 71 09/27/2016 1532   GFRAA >60 01/18/2018 0526   GFRAA 82 09/27/2016 1532   Lipase  No results found for: "LIPASE"  Studies/Results: [place] EKG SITE RITE  Result Date: 04/17/2022 If Site Rite image not attached, placement could not be confirmed due to current cardiac rhythm.   Anti-infectives: Anti-infectives (From admission, onward)    Start     Dose/Rate Route Frequency Ordered Stop   04/17/22 1100  ceFAZolin (ANCEF) IVPB 2g/100 mL premix        2 g 200 mL/hr over 30 Minutes Intravenous On call to O.R. 04/17/22 1045 04/17/22 1150   04/09/22 1100  Ampicillin-Sulbactam (UNASYN) 3 g in sodium chloride 0.9 % 100 mL IVPB  Status:  Discontinued        3 g 200 mL/hr over 30 Minutes Intravenous Every 8 hours  04/09/22 1045 04/14/22 1419        Assessment/Plan POD 2 s/p Exploratory laparotomy, lysis of adhesions for SBO by Dr. Luisa Hart, 04/17/22 - Cont NGT to LIWS, AROBF - Cont TPN - Bx Mesentery of SB reassuring with benign dense fibrovascular stroma compatible with an adhesion - Mobilize for bowel function, PT. Okay with NGT clamped for mobilization. Currently recommending SNF. Plan is to return to [facility] at d/c.  - Pulm toilet   FEN - NPO, NGT, TPN, IVF per TRH VTE - SCDs, Lovenox ID - None currently. Afebrile. Monitor wbc, 14.3 from 11.9 - am labs.  Foley - None. Urine blood tinged. Denies urinary symptoms. Defer to [facility] if they would like to check UA.    - Per TRH -  New dx DM (DKA on  presentation) AKI - resolved PNA - completed abx   LOS: 10 days    Jacinto Halim , [facility] Surgery 04/19/2022, 11:16 AM Please see Amion for pager number during day hours 7:00am-4:30pm

## 2022-04-19 NOTE — Progress Notes (Signed)
Physical Therapy Treatment Patient Details Name: Cody [name] MRN: [id number] DOB: [birth year] Today's Date: 04/19/2022   History of Present Illness 61 y.o. male presents to [facility] hospital on 04/09/2022 with AMS, found to be in DKA with newly diagnosed type 1 diabetes. Pt developed severe sepsis 2/2 with acute hypoxic respiratory failure 2/2 aspiration PNA, as well as ileus during admission. Pt underwent ex-lap with lysis of adhesions on 04/17/2022. PMH includes GERD, OA.    PT Comments    Pt tolerates treatment well, ambulating for increased distances. Pt continues to require physical assistance for functional mobility tasks, as well as sequencing for bed mobility and transfers. Pt is encouraged to mobilize frequently in an effort to reduce pain and improve activity tolerance. PT continues to recommend SNF placement at this time.   Recommendations for follow up therapy are one component of a multi-disciplinary discharge planning process, led by the attending physician.  Recommendations may be updated based on patient status, additional functional criteria and insurance authorization.  Follow Up Recommendations  Skilled nursing-short term rehab (<3 hours/day) Can patient physically be transported by private vehicle: Yes   Assistance Recommended at Discharge Intermittent Supervision/Assistance  Patient can return home with the following A lot of help with walking and/or transfers;A lot of help with bathing/dressing/bathroom;Assistance with cooking/housework;Assist for transportation;Help with stairs or ramp for entrance   Equipment Recommendations  None recommended by PT    Recommendations for Other Services       Precautions / Restrictions Precautions Precautions: Fall Precaution Comments: NG tube Restrictions Weight Bearing Restrictions: No     Mobility  Bed Mobility Overal bed mobility: Needs Assistance Bed Mobility: Rolling, Sidelying to Sit Rolling: Mod assist Sidelying to sit:  Mod assist, HOB elevated       General bed mobility comments: use of rails, requires step-by-step sequencing for rolling    Transfers Overall transfer level: Needs assistance Equipment used: Rolling walker (2 wheels) Transfers: Sit to/from Stand Sit to Stand: Min assist, From elevated surface           General transfer comment: tactile and verbal cues to initiate rocking and increased trunk flexion    Ambulation/Gait Ambulation/Gait assistance: Min guard Gait Distance (Feet): 25 Feet Assistive device: Rolling walker (2 wheels) Gait Pattern/deviations: Step-to pattern Gait velocity: reduced Gait velocity interpretation: <1.31 ft/sec, indicative of household ambulator   General Gait Details: pt with slowed step-to gait, increased trunk flexion over walker   Stairs             Wheelchair Mobility    Modified Rankin (Stroke Patients Only)       Balance Overall balance assessment: Needs assistance Sitting-balance support: Single extremity supported, Bilateral upper extremity supported, Feet supported Sitting balance-Leahy Scale: Poor     Standing balance support: Bilateral upper extremity supported, Reliant on assistive device for balance Standing balance-Leahy Scale: Poor                              Cognition Arousal/Alertness: Awake/alert Behavior During Therapy: Flat affect Overall Cognitive Status: Impaired/Different from baseline Area of Impairment: Problem solving                             Problem Solving: Slow processing          Exercises      General Comments General comments (skin integrity, edema, etc.): tachy into 110s, SpO2 stable in low  90s when ambulating on 4L [place]      Pertinent Vitals/Pain Pain Assessment Pain Assessment: 0-10 Pain Score: 6  Pain Location: abdomen Pain Descriptors / Indicators: Sore Pain Intervention(s): Monitored during session    Home Living                           Prior Function            PT Goals (current goals can now be found in the care plan section) Acute Rehab PT Goals Patient Stated Goal: to reduce pain Progress towards PT goals: Progressing toward goals    Frequency    Min 3X/week      PT Plan Current plan remains appropriate    Co-evaluation              AM-PAC PT "6 Clicks" Mobility   Outcome Measure  Help needed turning from your back to your side while in a flat bed without using bedrails?: A Lot Help needed moving from lying on your back to sitting on the side of a flat bed without using bedrails?: A Lot Help needed moving to and from a bed to a chair (including a wheelchair)?: A Little Help needed standing up from a chair using your arms (e.g., wheelchair or bedside chair)?: A Little Help needed to walk in hospital room?: A Little Help needed climbing 3-5 steps with a railing? : Total 6 Click Score: 14    End of Session Equipment Utilized During Treatment: Oxygen Activity Tolerance: Patient tolerated treatment well Patient left: in chair;with call bell/phone within reach;with chair alarm set Nurse Communication: Mobility status PT Visit Diagnosis: Unsteadiness on feet (R26.81);Muscle weakness (generalized) (M62.81)     Time: 5176-1607 PT Time Calculation (min) (ACUTE ONLY): 39 min  Charges:  $Gait Training: 8-22 mins $Therapeutic Activity: 23-37 mins                     Arlyss Gandy, PT, DPT Acute Rehabilitation Office [phone]    Arlyss Gandy 04/19/2022, 10:51 AM

## 2022-04-19 NOTE — TOC Progression Note (Signed)
Transition of Care [facility]) - Progression Note    Patient Details  Name: Cody [name] MRN: [id number] Date of Birth: 11-14-1960  Transition of Care [facility] CM/SW Contact  Delilah Shan, LCSWA Phone Number: 04/19/2022, 10:00 AM  Clinical Narrative:     Plan to return to [facility] when medically ready for dc. CSW will continue to follow and assist with patients dc planning needs.  Expected Discharge Plan: Skilled Nursing Facility Barriers to Discharge: Continued Medical Work up  Expected Discharge Plan and Services Expected Discharge Plan: Skilled Nursing Facility In-house Referral: Clinical Social Work     Living arrangements for the past 2 months: Skilled Nursing Facility                                       Social Determinants of Health (SDOH) Interventions    Readmission Risk Interventions     No data to display

## 2022-04-19 NOTE — Progress Notes (Signed)
Cody NOTE  CRANFORD BLESSINGER IFO:[id number] DOB: 05-31-1961   PCP: Ladell Pier, Cody  Patient is from: SNF  DOA: 04/09/2022 LOS: 41  Chief complaints Chief Complaint  Patient presents with   Code Stroke   Hyperglycemia     Brief Narrative / Interim history: 61 yo M with PMH of [name], Cody [name] to have DKA with newly diagnosed diabetes, acute respiratory failure with hypoxia likely due to aspiration pneumonia/pneumonitis, AKI, encephalopathy and strokelike symptoms.  He was treated with IV insulin and IV fluid.  DKA resolved.  He was transitioned to subcu insulin.  C-peptide low suggesting type 1 diabetes.  CVA work-up negative.  EEG did not show seizure.  Encephalopathy and AKI resolved.  He is on IV Unasyn for possible aspiration pneumonia.  Respiratory failure improving.  Completed antibiotic course.  Slowly weaning of oxygen.  Patient developed abdominal pain, distention and emesis.  KUB concerning for ileus.  CT abdomen and pelvis raises concern for partial SBO.  General surgery consulted on 9/2.  SBO protocol initiated but SBO persisted.  Underwent ex lap with lysis of adhesions by Dr. Brantley Stage on 9/5.  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  Started on TPN.  Therapy recommended SNF.  Subjective: Seen and examined earlier this morning.  No major events overnight of this morning.  No complaints.  He denies pain, shortness of breath, nausea or vomiting.  Objective: Vitals:   04/18/22 1952 04/18/22 2350 04/19/22 0417 04/19/22 1217  BP: 117/69 124/67 124/63 128/74  Pulse: (!) 106 (!) 107 (!) 105 96  Resp: _0 Temp: 99.5 F (37.5 C) 99.6 F (37.6 C) 99.1 F (37.3 C) 98.9 F (37.2 C)  TempSrc: Oral Oral Oral Oral  SpO2: 93% 90%  95%  Weight:      Height:        Examination:  GENERAL: No apparent distress.  Nontoxic. HEENT: MMM.  Vision and hearing  grossly intact.  NECK: Supple.  No apparent JVD.  RESP:  No IWOB.  Fair aeration bilaterally. CVS:  RRR. Heart sounds normal.  ABD/GI/GU: BS diminished.  Abdomen feels distended.  No tenderness.  Honeycomb dressing in place. MSK/EXT:  Moves extremities. No apparent deformity. No edema.  SKIN: no apparent skin lesion or wound NEURO: Awake and alert. Oriented appropriately.  No apparent focal neuro deficit. PSYCH: Somewhat flat affect.  Procedures:  9/5-ex lap with lysis of adhesions by Dr. Brantley Stage on 9/5.  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    Microbiology summarized: COVID-19 PCR nonreactive. Blood cultures NGTD  Assessment and plan: Principal Problem:   DKA (diabetic ketoacidosis) ([place]) Active Problems:   [name] (gastroesophageal reflux disease)   Acute metabolic encephalopathy   Severe sepsis (HCC)   AKI (acute kidney injury) ([address])   Dehydration   SIRS (systemic inflammatory response syndrome) (HCC)   Cody rhinitis   Ileus (HCC)   Acute blood loss anemia  Ileus/possible SBO: Significant distention with intermittent emesis.  CT raises concern for partial SBO/ileus.  SBO protocol initiated.  SBO seems to have persisted despite NG tube -ex lap with lysis of adhesions by Dr. Brantley Stage on 9/5.   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   -TPN started 9/6. -NPO. -Continue IV Protonix. -Mobilize patient  DM-1 with DKA, hyperglycemia and hyperlipidemia  A1c 11.7%.  C-peptide 0.6.  LDL 151 DKA resolved.  Recent Labs  Lab 04/18/22 2024 04/18/22 2349 04/19/22 0416 04/19/22 0822 04/19/22 1122  GLUCAP 161* 158* 144* 150* 161*  -Continue SSI-sensitive every 4 hours while n.p.o. -Further adjustment as appropriate.   Acute kidney injury: In the setting of DKA.  Resolved. Recent Labs    04/10/22 0223 04/11/22 0713 04/12/22 0312 04/13/22 0506 04/14/22 0949 04/15/22 0223  04/16/22 0200 04/17/22 0344 04/18/22 0456 04/19/22 0045  BUN _0 CREATININE 1.18 1.04 0.97 0.91 0.91 1.18 1.14 1.22 1.06 1.03  -Monitor as needed   Acute metabolic encephalopathy/strokelike symptoms: CVA work-up unrevealing.  EEG unremarkable.  Encephalopathy resolved.  He is oriented x4. -Reorientation and delirium precautions   Severe sepsis with acute hypoxic respiratory failure due to aspiration pneumonia: POA.  Required up to 10 L at some point.  CT chest with patchy infiltrate.  Likely in the setting of encephalopathy.  TTE with normal LVEF and G1 DD.  Respiratory failure improving.  Procalcitonin and sepsis physiology resolved. -Completed IV Unasyn for 5 days. -Wean off oxygen as able -Encourage incentive spirometry  Acute blood loss anemia?  Hgb dropped about 4 g.  No obvious bleeding.  Urine orange-red but no frank blood.  No report of melena or hematochezia. Recent Labs    04/09/22 0406 04/09/22 1031 04/11/22 0713 04/12/22 0312 04/13/22 0506 04/15/22 0223 04/16/22 0200 04/17/22 0344 04/18/22 0456 04/19/22 0045  HGB 16.9 16.0 13.9 15.5 15.2 16.3 14.1 11.9* 12.5* 10.0*  -Recheck CBC -We will stop subcu Lovenox and obtain CT abdomen if continues to drop.  Sinus tachycardia: HR in 100s.  Improved. -IV metoprolol to 5 mg as needed HR > 120.  [name]: -Continue PPI   Mild hypernatremia/hypercalcemia: Likely from dehydration.  Resolved.  Hypokalemia: Resolved.  Tobacco use disorder -Encourage cessation -Continue nicotine patch  Generalized weakness/physical deconditioning -PT/OT recommended return to SNF for further therapy  Leukocytosis/bandemia: Likely demargination.  Resolving. -Continue monitoring  Obesity Body mass index is 31.82 kg/m. Nutrition Problem: Increased nutrient needs Etiology: post-op healing Signs/Symptoms: estimated needs Interventions: TPN   DVT prophylaxis:  enoxaparin (LOVENOX) injection 40 mg Start:  04/18/22 1030 SCDs Start: 04/09/22 0317  Code Status: Full code Family Communication: None at bedside Level of care: Med-Surg.  Status is: Inpatient Remains inpatient appropriate because: Newly diagnosed DM-1, aspiration pneumonia and ileus/SBO   Final disposition: SNF Consultants:  Neurology-signed off General surgery  Sch Meds:  Scheduled Meds:  atorvastatin  20 mg Oral Daily   bisacodyl  10 mg Rectal Daily   Chlorhexidine Gluconate Cloth  6 each Topical Daily   enoxaparin (LOVENOX) injection  40 mg Subcutaneous Q24H   insulin aspart  0-9 Units Subcutaneous Q6H   insulin starter kit- pen needles  1 kit Other Once   mouth rinse  15 mL Mouth Rinse 4 times per day   pantoprazole (PROTONIX) IV  40 mg Intravenous Q24H   sodium chloride flush  10-40 mL Intracatheter Q12H   Continuous Infusions:  sodium chloride 10 mL/hr at 04/16/22 2058   lactated ringers 60 mL/hr at 04/19/22 1003   potassium PHOSPHATE IVPB (in mmol) 30 mmol (04/19/22 1021)   TPN ADULT (ION) 50 mL/hr at 04/19/22 0326   TPN ADULT (ION)     PRN Meds:.sodium chloride, dextrose, metoprolol tartrate, morphine injection, nicotine, ondansetron (ZOFRAN) IV, mouth rinse, sodium chloride flush  Antimicrobials: Anti-infectives (From admission, onward)    Start     Dose/Rate Route Frequency Ordered Stop   04/17/22  1100  ceFAZolin (ANCEF) IVPB 2g/100 mL premix        2 g 200 mL/hr over 30 Minutes Intravenous On call to O.R. 04/17/22 1045 04/17/22 1150   04/09/22 1100  Ampicillin-Sulbactam (UNASYN) 3 g in sodium chloride 0.9 % 100 mL IVPB  Status:  Discontinued        3 g 200 mL/hr over 30 Minutes Intravenous Every 8 hours 04/09/22 1045 04/14/22 1419        I have personally reviewed the following labs and images: CBC: Recent Labs  Lab 04/15/22 0223 04/16/22 0200 04/17/22 0344 04/18/22 0456 04/19/22 0045  WBC 17.0* 10.6* 10.6* 11.9* 14.3*  NEUTROABS  --  6.2 5.2  --   --   HGB 16.3 14.1 11.9* 12.5* 10.0*   HCT 45.8 41.3 35.4* 36.7* 29.5*  MCV 96.2 99.3 102.0* 100.8* 102.1*  PLT 317 322 305 329 306   BMP &GFR Recent Labs  Lab 04/15/22 0223 04/16/22 0200 04/17/22 0344 04/18/22 0456 04/19/22 0045  NA 139 141 139 139 136  K 3.7 4.3 3.2* 4.3 3.5  CL 100 97* 106 109 104  CO2 _0 GLUCOSE 183* 174* 118* 138* 160*  BUN _1 CREATININE 1.18 1.14 1.22 1.06 1.03  CALCIUM 10.6* 11.0* 8.7* 8.4* 8.0*  MG 2.2 2.3 2.2 2.0 2.2  PHOS 5.4* 5.5* 2.9 3.2 2.2*   Estimated Creatinine Clearance: 91.1 mL/min (by C-G formula based on SCr of 1.03 mg/dL). Liver & Pancreas: Recent Labs  Lab 04/15/22 0223 04/16/22 0200 04/17/22 0344 04/18/22 0456 04/19/22 0045  AST 29  --  _2 ALT 27  --  23 35 36  ALKPHOS 73  --  53 50 47  BILITOT 1.2  --  1.3* 2.0* 1.4*  PROT 6.9  --  5.9* 5.5* 5.2*  ALBUMIN 3.3* 3.3* 2.8* 2.5* 2.2*   No results for input(s): "LIPASE", "AMYLASE" in the last 168 hours. No results for input(s): "AMMONIA" in the last 168 hours.  Diabetic: No results for input(s): "HGBA1C" in the last 72 hours.  Recent Labs  Lab 04/18/22 2024 04/18/22 2349 04/19/22 0416 04/19/22 0822 04/19/22 1122  GLUCAP 161* 158* 144* 150* 161*   Cardiac Enzymes: No results for input(s): "CKTOTAL", "CKMB", "CKMBINDEX", "TROPONINI" in the last 168 hours. No results for input(s): "PROBNP" in the last 8760 hours. Coagulation Profile: No results for input(s): "INR", "PROTIME" in the last 168 hours.  Thyroid Function Tests: No results for input(s): "TSH", "T4TOTAL", "FREET4", "T3FREE", "THYROIDAB" in the last 72 hours.  Lipid Profile: Recent Labs    04/18/22 0456 04/19/22 0045  TRIG 150* 218*    Anemia Panel: No results for input(s): "VITAMINB12", "FOLATE", "FERRITIN", "TIBC", "IRON", "RETICCTPCT" in the last 72 hours. Urine analysis:    Component Value Date/Time   COLORURINE YELLOW 04/09/2022 0250   APPEARANCEUR CLEAR 04/09/2022 0250   LABSPEC 1.027 04/09/2022  0250   PHURINE 5.0 04/09/2022 0250   GLUCOSEU >=500 (A) 04/09/2022 0250   HGBUR SMALL (A) 04/09/2022 0250   BILIRUBINUR NEGATIVE 04/09/2022 0250   KETONESUR 80 (A) 04/09/2022 0250   PROTEINUR 100 (A) 04/09/2022 0250   UROBILINOGEN 0.2 12/08/2009 2250   NITRITE NEGATIVE 04/09/2022 0250   LEUKOCYTESUR NEGATIVE 04/09/2022 0250   Sepsis Labs: Invalid input(s): "PROCALCITONIN", "LACTICIDVEN"  Microbiology: Recent Results (from the past 240 hour(s))  Culture, blood (Routine X 2) w Reflex to ID Panel     Status: None   Collection  Time: 04/09/22 10:38 PM   Specimen: BLOOD RIGHT HAND  Result Value Ref Range Status   Specimen Description BLOOD RIGHT HAND  Final   Special Requests   Final    BOTTLES DRAWN AEROBIC AND ANAEROBIC Blood Culture adequate volume   Culture   Final    NO GROWTH 5 DAYS Performed at [facility], [address] [place], [place] [address]    Report Status 04/14/2022 FINAL  Final    Radiology Studies: No results [name].    Abrahm Mancia T. [place]  If 7PM-7AM, please contact night-coverage [email or web address] 04/19/2022, 1:59 PM

## 2022-04-19 NOTE — Progress Notes (Signed)
PHARMACY - TOTAL PARENTERAL NUTRITION CONSULT NOTE   Indication: Small bowel obstruction/prolonged ileus  Patient Measurements: Height: 5\' 11"  (180.3 cm) Weight: 103.5 kg (228 lb 2.8 oz) IBW/kg (Calculated) : 75.3 TPN AdjBW (KG): 82.3 Body mass index is 31.82 kg/m. Usual Weight: 100-103 kg  Assessment:  62 yo M with newly diagnosed type 1 diabetes. Patient developed ileus 9/1 which then evolved into SBO now s/p ex lap 04/17/22. Pt was eating ~50% of dysphagia/carb diet from 8/28-9/1. NPO since 04/14/22. Anticipate prolonged ileus - pharmacy consulted for TPN.   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  Central access: PICC ordered 04/17/22 TPN start date: 04/18/22  Nutritional Goals: Goal TPN rate is 95 mL/hr (provides 129 g of protein, 73g lipid, 296g dextrose, and 2257 kcals per day)  RD Assessment: Estimated Needs Total Energy Estimated Needs: 2200-2400 Total Protein Estimated Needs: 110-125 grams Total Fluid Estimated Needs: >/= 2 L pending  Current Nutrition:  NPO and TPN  Plan:  Increase rate of TPN to 95 mL/hr at 1800 provides 129g protein, 2257 kcal, providing 100% of needs)  Give KPhos 06/17/22 x1  Electrolytes in TPN: Na 100 mEq/L, K 40 mEq/L, Ca 3 mEq/L, Mg 6 mEq/L, and Phos 12 mmol/L. Cl:Ac 1:1 Add standard MVI and trace elements to TPN Continue to Sensitive SSI but reduce q6h SSI and adjust as needed Increase Insulin regular 45u in TPN   D/c LR mIVF @ 1800 Monitor  TPN labs daily until stable at goal and then on Mon/Thurs once stable Triglycerides daily   06/18/22, PharmD Clinical Pharmacist 04/19/2022 7:09 AM

## 2022-04-20 ENCOUNTER — Inpatient Hospital Stay (HOSPITAL_COMMUNITY): Payer: Medicaid Other

## 2022-04-20 DIAGNOSIS — R6510 Systemic inflammatory response syndrome (SIRS) of non-infectious origin without acute organ dysfunction: Secondary | ICD-10-CM

## 2022-04-20 DIAGNOSIS — D62 Acute posthemorrhagic anemia: Secondary | ICD-10-CM | POA: Diagnosis not present

## 2022-04-20 DIAGNOSIS — G9341 Metabolic encephalopathy: Secondary | ICD-10-CM | POA: Diagnosis not present

## 2022-04-20 DIAGNOSIS — N179 Acute kidney failure, unspecified: Secondary | ICD-10-CM | POA: Diagnosis not present

## 2022-04-20 DIAGNOSIS — K219 Gastro-esophageal reflux disease without esophagitis: Secondary | ICD-10-CM

## 2022-04-20 DIAGNOSIS — E860 Dehydration: Secondary | ICD-10-CM

## 2022-04-20 DIAGNOSIS — E1310 Other specified diabetes mellitus with ketoacidosis without coma: Secondary | ICD-10-CM | POA: Diagnosis not present

## 2022-04-20 LAB — CBC
HCT: 26.5 % — ABNORMAL LOW (ref 39.0–52.0)
Hemoglobin: 8.9 g/dL — ABNORMAL LOW (ref 13.0–17.0)
MCH: 34.6 pg — ABNORMAL HIGH (ref 26.0–34.0)
MCHC: 33.6 g/dL (ref 30.0–36.0)
MCV: 103.1 fL — ABNORMAL HIGH (ref 80.0–100.0)
Platelets: 309 10*3/uL (ref 150–400)
RBC: 2.57 MIL/uL — ABNORMAL LOW (ref 4.22–5.81)
RDW: 12.8 % (ref 11.5–15.5)
WBC: 12.7 10*3/uL — ABNORMAL HIGH (ref 4.0–10.5)
nRBC: 1 % — ABNORMAL HIGH (ref 0.0–0.2)

## 2022-04-20 LAB — BASIC METABOLIC PANEL
Anion gap: 7 (ref 5–15)
BUN: 16 mg/dL (ref 8–23)
CO2: 25 mmol/L (ref 22–32)
Calcium: 8.4 mg/dL — ABNORMAL LOW (ref 8.9–10.3)
Chloride: 107 mmol/L (ref 98–111)
Creatinine, Ser: 0.82 mg/dL (ref 0.61–1.24)
GFR, Estimated: >60 mL/min (ref >60)
Glucose, Bld: 153 mg/dL — ABNORMAL HIGH (ref 70–99)
Potassium: 3.8 mmol/L (ref 3.5–5.1)
Sodium: 139 mmol/L (ref 135–145)

## 2022-04-20 LAB — GLUCOSE, CAPILLARY
Glucose-Capillary: 172 mg/dL — ABNORMAL HIGH (ref 70–99)
Glucose-Capillary: 166 mg/dL — ABNORMAL HIGH (ref 70–99)
Glucose-Capillary: 176 mg/dL — ABNORMAL HIGH (ref 70–99)
Glucose-Capillary: 175 mg/dL — ABNORMAL HIGH (ref 70–99)
Glucose-Capillary: 179 mg/dL — ABNORMAL HIGH (ref 70–99)

## 2022-04-20 LAB — PHOSPHORUS: Phosphorus: 3.1 mg/dL (ref 2.5–4.6)

## 2022-04-20 LAB — MAGNESIUM: Magnesium: 2.3 mg/dL (ref 1.7–2.4)

## 2022-04-20 LAB — TRIGLYCERIDES: Triglycerides: 274 mg/dL — ABNORMAL HIGH (ref <150)

## 2022-04-20 MED ORDER — TRAVASOL 10 % IV SOLN
INTRAVENOUS | Status: AC
  Filled 2022-04-20: qty 1299.6

## 2022-04-20 MED ORDER — INSULIN ASPART 100 UNIT/ML IJ SOLN
0.0000 [IU] | Freq: Three times a day (TID) | INTRAMUSCULAR | Status: DC
  Administered 2022-04-20 (×2): 2 [IU] via SUBCUTANEOUS
  Administered 2022-04-21: 3 [IU] via SUBCUTANEOUS
  Administered 2022-04-21 – 2022-04-22 (×3): 2 [IU] via SUBCUTANEOUS

## 2022-04-20 NOTE — Progress Notes (Signed)
Progress Note  Patient: Cody [name] LOV:[id number] DOB: 1961/05/27  DOA: 04/09/2022  DOS: 04/20/2022    Brief hospital course: Cody [name] is a 61 y.o. male with a history of GERD, allergic rhinitis who presented 8/28 from [facility] SNF with confusion found to be in DKA (new diagnosis of diabetes). Neuroimaging showed no stroke. the patient was also hypoxic, diagnosed with aspiration pneumonia and improved on antibiotics. DKA, AKI, encephalopathy all resolved.   He developed abdominal pain and distention, vomiting with SBO shown on CT. Surgery was consulted, performed ex lap with LOA by Dr. Luisa Hart on 9/5 with biopsy suggestive of adhesion. TPN has been started, exam and imaging consistent with persistent ileus.   Assessment and Plan: SBO: s/p ex lap, LOA by Dr. Luisa Hart 9/5. Biopsy reassuring with benign dense fibrovascular stroma suggestive of adhesion.  - NGT output down abruptly and pt reports some dislodgment overnight, checked abd XR this AM. D/w RN to advance NG by ~9cm. Continue to LIWS - Continue TPN (started 9/6). Stop IVF - OOB!   T2DM newly diagnosed presenting with DKA, hyperglycemia and hyperlipidemia: A1c 11.7%. C-peptide low at 0.6, consider recheck outside scope of DKA.  - Continue statin. LDL 151   - Continue SSI-sensitive every 4 hours while n.p.o. - Further adjustment as appropriate. - Education ongoing.   Acute kidney injury: In the setting of DKA.  Resolved. - Avoid nephrotoxins  Acute metabolic encephalopathy/strokelike symptoms: CVA work-up unrevealing.  EEG unremarkable. Resolved.   Severe sepsis with acute hypoxic respiratory failure due to aspiration pneumonia: POA. Sepsis physiology resolved, PCT low and trended downward, has completed unasyn x5 days.  - Monitor off further abx  Acute hypoxic respiratory failure: Initially related to aspiration pneumonia and sepsis. ?if now also due to atelectasis. I/O as documented show net negative, though weights up  98kg > 108kg. TTE with normal LVEF and G1 DD. - Abd XR as above downs bibasilar atelectasis/hypoventilation. No focal infiltrate on nondedicated image. Consider trial of lasix if unable to wean O2. - Encourage incentive spirometry   Acute blood loss anemia: Hgb dropped 15g/dl on admission >> 8.8C/ZY. Urine is dark, but without hgb or RBCs on analysis. No melena/hematochezia. ?if related to surgery.  - Continue trending, check anemia panel in AM (becoming macrocytic). - Stop prophylactic lovenox for now   Sinus tachycardia: Improved. - IV metoprolol to 5 mg as needed HR > 120.   GERD: - Continue PPI   Mild hypernatremia/hypercalcemia: Likely from dehydration.  Resolved.   Hypokalemia: Resolved.   Tobacco use disorder -Encourage cessation -Continue nicotine patch   Generalized weakness/physical deconditioning - PT/OT recommended return to SNF for further therapy   Leukocytosis/bandemia: Likely demargination.  Resolving. -Continue monitoring  Obesity: Estimated body mass index is 33.39 kg/m as calculated from the following:   Height as of this encounter: 5\' 11"  (1.803 m).   Weight as of this encounter: 108.6 kg.  Subjective: Pt wants to eat, says abdominal pain is stable, +nausea worsening this AM. No vomiting.   Objective: Vitals:   04/19/22 2100 04/20/22 0500 04/20/22 0737 04/20/22 1132  BP: 136/84 122/75 129/71 (!) 146/80  Pulse: (!) 110 96 100 96  Resp: 20 18 20 16   Temp: 98.7 F (37.1 C) 98.8 F (37.1 C) 98.6 F (37 C) 98.3 F (36.8 C)  TempSrc: Oral Axillary Oral Oral  SpO2: 92% 94% 90% 95%  Weight:  108.6 kg    Height:       Gen: 61 y.o. male  in no distress Pulm: Nonlabored breathing supplemental oxygen, diminished throughout, worse at bases. Improves with cough. CV: Regular rate and rhythm. No murmur, rub, or gallop. No JVD, no dependent edema. GI: Abdomen distended, modestly tender to deep palpation, hypoactive. Midline incision c/d/I. Ext: Warm, no  deformities Skin: No rashes, lesions or ulcers on visualized skin. Neuro: Alert and oriented. No focal neurological deficits. Psych: Judgement and insight appear fair. Mood euthymic & affect congruent. Behavior is appropriate.    Data Personally reviewed: CBC: Recent Labs  Lab 04/16/22 0200 04/17/22 0344 04/18/22 0456 04/19/22 0045 04/19/22 1458 04/20/22 0500  WBC 10.6* 10.6* 11.9* 14.3* 11.7* 12.7*  NEUTROABS 6.2 5.2  --   --   --   --   HGB 14.1 11.9* 12.5* 10.0* 9.4* 8.9*  HCT 41.3 35.4* 36.7* 29.5* 28.2* 26.5*  MCV 99.3 102.0* 100.8* 102.1* 105.2* 103.1*  PLT 322 305 329 306 308 309   Basic Metabolic Panel: Recent Labs  Lab 04/16/22 0200 04/17/22 0344 04/18/22 0456 04/19/22 0045 04/20/22 0500  NA 141 139 139 136 139  K 4.3 3.2* 4.3 3.5 3.8  CL 97* 106 109 104 107  CO2 26 24 23 25 25   GLUCOSE 174* 118* 138* 160* 153*  BUN 23 21 19 15 16   CREATININE 1.14 1.22 1.06 1.03 0.82  CALCIUM 11.0* 8.7* 8.4* 8.0* 8.4*  MG 2.3 2.2 2.0 2.2 2.3  PHOS 5.5* 2.9 3.2 2.2* 3.1   GFR: Estimated Creatinine Clearance: 117.1 mL/min (by C-G formula based on SCr of 0.82 mg/dL).  Liver Function Tests: Recent Labs  Lab 04/15/22 0223 04/16/22 0200 04/17/22 0344 04/18/22 0456 04/19/22 0045  AST 29  --  23 29 27   ALT 27  --  23 35 36  ALKPHOS 73  --  53 50 47  BILITOT 1.2  --  1.3* 2.0* 1.4*  PROT 6.9  --  5.9* 5.5* 5.2*  ALBUMIN 3.3* 3.3* 2.8* 2.5* 2.2*   Family Communication: None at bedside  Disposition: Status is: Inpatient Remains inpatient appropriate because: Ongoing SBO/ileus. Planned Discharge Destination:  Return to [facility], MD 04/20/2022 4:38 PM Page by [email or web address]

## 2022-04-20 NOTE — TOC Progression Note (Signed)
Transition of Care [facility]) - Progression Note    Patient Details  Name: MADHAV MOHON MRN: [id number] Date of Birth: 1961/08/08  [facility] CM/SW Contact  Delilah Shan, LCSWA Phone Number: 04/20/2022, 10:01 AM  Clinical Narrative:     Plan to return to [facility] when medically ready for dc. CSW will continue to follow and assist with patients dc planning needs.  Expected Discharge Plan: Skilled Nursing Facility Barriers to Discharge: Continued Medical Work up  Expected Discharge Plan and Services Expected Discharge Plan: Skilled Nursing Facility In-house Referral: Clinical Social Work     Living arrangements for the past 2 months: Skilled Nursing Facility                                       Social Determinants of Health (SDOH) Interventions    Readmission Risk Interventions     No data to display

## 2022-04-20 NOTE — Progress Notes (Signed)
PHARMACY - TOTAL PARENTERAL NUTRITION CONSULT NOTE   Indication: Small bowel obstruction/prolonged ileus  Patient Measurements: Height: 5\' 11"  (180.3 cm) Weight: 108.6 kg (239 lb 6.7 oz) IBW/kg (Calculated) : 75.3 TPN AdjBW (KG): 82.3 Body mass index is 33.39 kg/m. Usual Weight: 100-103 kg  Assessment:  61 yo M with newly diagnosed type 1 diabetes. Patient developed ileus 9/1 which then evolved into SBO now s/p ex lap 04/17/22. Pt was eating ~50% of dysphagia/carb diet from 8/28-9/1. NPO since 04/14/22. Anticipate prolonged ileus - pharmacy consulted for TPN.   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 Cl:Ac 1:1 Add standard MVI and trace elements to TPN Continue to Sensitive SSI but reduce q8h SSI and adjust as needed Continue Insulin regular 45u in TPN   Monitor TPN labs daily until stable at goal and then on Mon/Thurs once  stable Triglycerides daily ordered through Sunday   Sunday, PharmD Clinical Pharmacist 04/20/2022 8:24 AM

## 2022-04-20 NOTE — Progress Notes (Signed)
3 Days Post-Op  Subjective: CC: Pain controlled. +flatus. No BM. Urine still orange/blood tinged.  States he has been getting out of bed a little bit.  NG 650 cc/24h Objective: Vital signs in last 24 hours: Temp:  [98.6 F (37 C)-98.9 F (37.2 C)] 98.6 F (37 C) (09/08 0737) Pulse Rate:  [96-110] 100 (09/08 0737) Resp:  [18-20] 20 (09/08 0737) BP: (122-136)/(71-84) 129/71 (09/08 0737) SpO2:  [90 %-95 %] 90 % (09/08 0737) Weight:  [108.6 kg] 108.6 kg (09/08 0500) Last BM Date : 04/18/22  Intake/Output from previous day: 09/07 0701 - 09/08 0700 In: 2991.1 [I.V.:2115.7; IV Piggyback:875.4] Out: 1450 [Urine:800; Emesis/NG output:650] Intake/Output this shift: No intake/output data recorded.  PE: Gen:  Alert, NAD, cooperative. Belching during my exam  Abd: Moderate distension but still soft, appropriately tender around incision. Mild lower abd tenderness. Hypoactive tinkering bowel sounds. Midline wound with honeycomb dressing in place over staples with a very small amount of SS strikethrough.   Ng in place - nothing in cannister, flushed and it is working.   Lab Results:  Recent Labs    04/19/22 1458 04/20/22 0500  WBC 11.7* 12.7*  HGB 9.4* 8.9*  HCT 28.2* 26.5*  PLT 308 309   BMET Recent Labs    04/19/22 0045 04/20/22 0500  NA 136 139  K 3.5 3.8  CL 104 107  CO2 25 25  GLUCOSE 160* 153*  BUN 15 16  CREATININE 1.03 0.82  CALCIUM 8.0* 8.4*   PT/INR No results for input(s): "LABPROT", "INR" in the last 72 hours. CMP     Component Value Date/Time   NA 139 04/20/2022 0500   NA 142 11/15/2017 1646   K 3.8 04/20/2022 0500   CL 107 04/20/2022 0500   CO2 25 04/20/2022 0500   GLUCOSE 153 (H) 04/20/2022 0500   BUN 16 04/20/2022 0500   BUN 15 11/15/2017 1646   CREATININE 0.82 04/20/2022 0500   CREATININE 1.14 09/27/2016 1532   CALCIUM 8.4 (L) 04/20/2022 0500   PROT 5.2 (L) 04/19/2022 0045   PROT 7.3 10/07/2017 0954   ALBUMIN 2.2 (L) 04/19/2022 0045    ALBUMIN 4.4 10/07/2017 0954   AST 27 04/19/2022 0045   ALT 36 04/19/2022 0045   ALKPHOS 47 04/19/2022 0045   BILITOT 1.4 (H) 04/19/2022 0045   BILITOT 0.6 10/07/2017 0954   GFRNONAA >60 04/20/2022 0500   GFRNONAA 71 09/27/2016 1532   GFRAA >60 01/18/2018 0526   GFRAA 82 09/27/2016 1532   Lipase  No results found for: "LIPASE"  Studies/Results: No results found.  Anti-infectives: Anti-infectives (From admission, onward)    Start     Dose/Rate Route Frequency Ordered Stop   04/17/22 1100  ceFAZolin (ANCEF) IVPB 2g/100 mL premix        2 g 200 mL/hr over 30 Minutes Intravenous On call to O.R. 04/17/22 1045 04/17/22 1150   04/09/22 1100  Ampicillin-Sulbactam (UNASYN) 3 g in sodium chloride 0.9 % 100 mL IVPB  Status:  Discontinued        3 g 200 mL/hr over 30 Minutes Intravenous Every 8 hours 04/09/22 1045 04/14/22 1419        Assessment/Plan POD 3 s/p Exploratory laparotomy, lysis of adhesions for SBO by Dr. Luisa Hart, 04/17/22 - Cont NGT to LIWS, await return of bowel function - Cont TPN - Bx Mesentery of SB reassuring with benign dense fibrovascular stroma compatible with an adhesion - Mobilize for bowel function, PT. Okay with NGT clamped for  mobilization. Currently recommending SNF. Plan is to return to [facility] at d/c.  - Pulm toilet   FEN - NPO, NGT, TPN, IVF per TRH VTE - SCDs, Lovenox ID - None currently. Afebrile. Monitor wbc, 14.3 > 11.7 > 12.7. encourge IS! Foley - None. Urine blood tinged. Denies urinary symptoms. Defer to [facility] if they would like to check UA.    - Per TRH -  New dx DM (DKA on presentation) AKI - resolved PNA - completed abx   LOS: 11 days    Adam Phenix , [facility] Surgery 04/20/2022, 8:58 AM Please see Amion for pager number during day hours 7:00am-4:30pm

## 2022-04-21 ENCOUNTER — Inpatient Hospital Stay (HOSPITAL_COMMUNITY): Payer: Medicaid Other

## 2022-04-21 DIAGNOSIS — G9341 Metabolic encephalopathy: Secondary | ICD-10-CM | POA: Diagnosis not present

## 2022-04-21 DIAGNOSIS — D62 Acute posthemorrhagic anemia: Secondary | ICD-10-CM | POA: Diagnosis not present

## 2022-04-21 DIAGNOSIS — E1310 Other specified diabetes mellitus with ketoacidosis without coma: Secondary | ICD-10-CM | POA: Diagnosis not present

## 2022-04-21 DIAGNOSIS — N179 Acute kidney failure, unspecified: Secondary | ICD-10-CM | POA: Diagnosis not present

## 2022-04-21 LAB — BASIC METABOLIC PANEL
Anion gap: 9 (ref 5–15)
BUN: 18 mg/dL (ref 8–23)
CO2: 22 mmol/L (ref 22–32)
Calcium: 8.6 mg/dL — ABNORMAL LOW (ref 8.9–10.3)
Chloride: 107 mmol/L (ref 98–111)
Creatinine, Ser: 0.78 mg/dL (ref 0.61–1.24)
GFR, Estimated: >60 mL/min (ref >60)
Glucose, Bld: 185 mg/dL — ABNORMAL HIGH (ref 70–99)
Potassium: 4.1 mmol/L (ref 3.5–5.1)
Sodium: 138 mmol/L (ref 135–145)

## 2022-04-21 LAB — RETICULOCYTES
Immature Retic Fract: 34.6 % — ABNORMAL HIGH (ref 2.3–15.9)
RBC.: 2.68 MIL/uL — ABNORMAL LOW (ref 4.22–5.81)
Retic Count, Absolute: 99.2 10*3/uL (ref 19.0–186.0)
Retic Ct Pct: 3.7 % — ABNORMAL HIGH (ref 0.4–3.1)

## 2022-04-21 LAB — CBC
HCT: 27.4 % — ABNORMAL LOW (ref 39.0–52.0)
Hemoglobin: 9.3 g/dL — ABNORMAL LOW (ref 13.0–17.0)
MCH: 34.6 pg — ABNORMAL HIGH (ref 26.0–34.0)
MCHC: 33.9 g/dL (ref 30.0–36.0)
MCV: 101.9 fL — ABNORMAL HIGH (ref 80.0–100.0)
Platelets: 318 10*3/uL (ref 150–400)
RBC: 2.69 MIL/uL — ABNORMAL LOW (ref 4.22–5.81)
RDW: 13.1 % (ref 11.5–15.5)
WBC: 16.9 10*3/uL — ABNORMAL HIGH (ref 4.0–10.5)
nRBC: 1.1 % — ABNORMAL HIGH (ref 0.0–0.2)

## 2022-04-21 LAB — FOLATE: Folate: 14 ng/mL (ref >5.9)

## 2022-04-21 LAB — TRIGLYCERIDES: Triglycerides: 232 mg/dL — ABNORMAL HIGH (ref <150)

## 2022-04-21 LAB — IRON AND TIBC
Iron: 50 ug/dL (ref 45–182)
Saturation Ratios: 20 % (ref 17.9–39.5)
TIBC: 249 ug/dL — ABNORMAL LOW (ref 250–450)
UIBC: 199 ug/dL

## 2022-04-21 LAB — GLUCOSE, CAPILLARY
Glucose-Capillary: 173 mg/dL — ABNORMAL HIGH (ref 70–99)
Glucose-Capillary: 185 mg/dL — ABNORMAL HIGH (ref 70–99)
Glucose-Capillary: 203 mg/dL — ABNORMAL HIGH (ref 70–99)
Glucose-Capillary: 171 mg/dL — ABNORMAL HIGH (ref 70–99)

## 2022-04-21 LAB — D-DIMER, QUANTITATIVE: D-Dimer, Quant: 11.41 ug/mL-FEU — ABNORMAL HIGH (ref 0.00–0.50)

## 2022-04-21 LAB — VITAMIN B12: Vitamin B-12: 1137 pg/mL — ABNORMAL HIGH (ref 180–914)

## 2022-04-21 LAB — MAGNESIUM: Magnesium: 2.4 mg/dL (ref 1.7–2.4)

## 2022-04-21 LAB — FERRITIN: Ferritin: 1513 ng/mL — ABNORMAL HIGH (ref 24–336)

## 2022-04-21 LAB — PHOSPHORUS: Phosphorus: 3.4 mg/dL (ref 2.5–4.6)

## 2022-04-21 MED ORDER — FUROSEMIDE 10 MG/ML IJ SOLN
40.0000 mg | Freq: Once | INTRAMUSCULAR | Status: AC
  Administered 2022-04-21: 40 mg via INTRAVENOUS
  Filled 2022-04-21: qty 4

## 2022-04-21 MED ORDER — TRAVASOL 10 % IV SOLN
INTRAVENOUS | Status: AC
  Filled 2022-04-21: qty 1299.6

## 2022-04-21 MED ORDER — IOHEXOL 350 MG/ML SOLN: 100.0000 mL | Freq: Once | INTRAVENOUS | Status: DC | PRN

## 2022-04-21 MED ORDER — IOHEXOL 350 MG/ML SOLN
100.0000 mL | Freq: Once | INTRAVENOUS | Status: AC | PRN
  Administered 2022-04-21: 100 mL via INTRAVENOUS

## 2022-04-21 NOTE — Progress Notes (Signed)
Progress Note  Patient: Cody [name] AUQ:[id number] DOB: 09-14-1960  DOA: 04/09/2022  DOS: 04/21/2022    Brief hospital course: ARNE SCHLENDER is a 61 y.o. male with a history of GERD, allergic rhinitis who presented 8/28 from [facility] SNF with confusion found to be in DKA (new diagnosis of diabetes). Neuroimaging showed no stroke. the patient was also hypoxic, diagnosed with aspiration pneumonia and improved on antibiotics. DKA, AKI, encephalopathy all resolved.   He developed abdominal pain and distention, vomiting with SBO shown on CT. Surgery was consulted, performed ex lap with LOA by Dr. Luisa Hart on 9/5 with biopsy suggestive of adhesion. TPN has been started, exam and imaging consistent with persistent ileus.   Assessment and Plan: SBO: s/p ex lap, LOA by Dr. Luisa Hart 9/5. Biopsy reassuring with benign dense fibrovascular stroma suggestive of adhesion.  - NGT displaced proximally, confirmed by repeatXR this AM. Advanced 9 cm with resultant significant output. Continue to LIWS - Continue TPN (started 9/6).  - OOB!   T2DM newly diagnosed presenting with DKA, hyperglycemia:: A1c 11.7%. C-peptide low at 0.6, consider recheck/stim testing outside scope of DKA.  - Continue SSI-sensitive every 4 hours while n.p.o. - Further adjustment as appropriate. - Education ongoing.   Hyperlipidemia: LDL 151   - Restart statin once taking po.   Acute kidney injury: In the setting of DKA.  Resolved. - Avoid nephrotoxins  Acute metabolic encephalopathy/strokelike symptoms: CVA work-up unrevealing.  EEG unremarkable. Resolved.   Severe sepsis with acute hypoxic respiratory failure due to aspiration pneumonia: POA. Sepsis physiology resolved, PCT low and trended downward, has completed unasyn x5 days.  - Monitor off further abx  Acute hypoxic respiratory failure: Initially related to aspiration pneumonia and sepsis. ?if now also due to atelectasis. I/O as documented show net negative, though weights  up 98kg > 108kg. TTE with normal LVEF and G1 DD. - Abd XR as above downs bibasilar atelectasis/hypoventilation. No focal infiltrate on nondedicated image.  - Remains on 5L O2, has crackles. Will give lasix 40mg  IV x1 and monitor output and respiratory status. Also noted to be growing more tachycardic without hypotension. We've stopped prophylactic anticoagulation due to progressive anemia (which has stabilized). Will check d-dimer and pursue CTA chest if this fails to rule out clot. - Encourage incentive spirometry!   Acute blood loss anemia: Hgb dropped 15g/dl on admission >> . Seems to be stabilizing. Anemia panel without deficiency. Urine is dark, but without hgb or RBCs on analysis. No melena/hematochezia. ?if related to surgery.  - Stopped prophylactic lovenox, started SCDs. Will monitor for stability.    Sinus tachycardia: Improved. - IV metoprolol to 5 mg as needed HR > 120.   GERD: - Continue PPI   Mild hypernatremia/hypercalcemia: Likely from dehydration.  Resolved.   Hypokalemia: Resolved.   Tobacco use disorder -Encourage cessation -Continue nicotine patch   Generalized weakness/physical deconditioning - PT/OT recommended return to SNF for further therapy   Leukocytosis/bandemia: Likely demargination.  Resolving. -Continue monitoring  Obesity: Estimated body mass index is 33.39 kg/m as calculated from the following:   Height as of this encounter: 5\' 11"  (1.803 m).   Weight as of this encounter: 108.6 kg.  Subjective: Nausea worsened since yesterday, no abd pain mentioned to surgery PA though stated he is starting to have some pain to me prior to 6.3S/LH advancing NG tube. Had vomiting early this AM.   Objective: Vitals:   04/20/22 1721 04/20/22 2026 04/21/22 0520 04/21/22 1243  BP: (!) 143/82 (!) 140/84 06/21/22)  140/84 (!) 152/78  Pulse: 99 100 99 (!) 115  Resp: 18 18 18 18   Temp: 98 F (36.7 C) 98 F (36.7 C) 97.9 F (36.6 C) 97.8 F (36.6 C)  TempSrc: Oral Oral  Oral Oral  SpO2: 90% 91% 95% 93%  Weight:      Height:       Gen: 61 y.o. male in no distress HEENT: NGT R nare. Pulm: Nonlabored breathing with 5L O2, crackles bilaterally. CV: Regular tachycardia without murmur, rub, or gallop. No JVD, trace dependent edema. GI: Abdomen distended, modestly tender, with hypoactive bowel sounds.   Ext: Warm, no deformities Skin: No new rashes, lesions or ulcers on visualized skin. Neuro: Alert and oriented. No focal neurological deficits. Psych: Judgement and insight appear fair. Mood euthymic & affect congruent. Behavior is appropriate.    Data Personally reviewed: CBC: Recent Labs  Lab 04/16/22 0200 04/17/22 0344 04/18/22 0456 04/19/22 0045 04/19/22 1458 04/20/22 0500 04/21/22 0500  WBC 10.6* 10.6* 11.9* 14.3* 11.7* 12.7* 16.9*  NEUTROABS 6.2 5.2  --   --   --   --   --   HGB 14.1 11.9* 12.5* 10.0* 9.4* 8.9* 9.3*  HCT 41.3 35.4* 36.7* 29.5* 28.2* 26.5* 27.4*  MCV 99.3 102.0* 100.8* 102.1* 105.2* 103.1* 101.9*  PLT 322 305 329 306 308 309 318   Basic Metabolic Panel: Recent Labs  Lab 04/17/22 0344 04/18/22 0456 04/19/22 0045 04/20/22 0500 04/21/22 0500  NA 139 139 136 139 138  K 3.2* 4.3 3.5 3.8 4.1  CL 106 109 104 107 107  CO2 24 23 25 25 22   GLUCOSE 118* 138* 160* 153* 185*  BUN 21 19 15 16 18   CREATININE 1.22 1.06 1.03 0.82 0.78  CALCIUM 8.7* 8.4* 8.0* 8.4* 8.6*  MG 2.2 2.0 2.2 2.3 2.4  PHOS 2.9 3.2 2.2* 3.1 3.4   GFR: Estimated Creatinine Clearance: 120 mL/min (by C-G formula based on SCr of 0.78 mg/dL).  Liver Function Tests: Recent Labs  Lab 04/15/22 0223 04/16/22 0200 04/17/22 0344 04/18/22 0456 04/19/22 0045  AST 29  --  23 29 27   ALT 27  --  23 35 36  ALKPHOS 73  --  53 50 47  BILITOT 1.2  --  1.3* 2.0* 1.4*  PROT 6.9  --  5.9* 5.5* 5.2*  ALBUMIN 3.3* 3.3* 2.8* 2.5* 2.2*   Family Communication: None at bedside  Disposition: Status is: Inpatient Remains inpatient appropriate because: Ongoing  SBO/ileus. Planned Discharge Destination:  Return to [facility], MD 04/21/2022 3:12 PM Page by [email or web address]

## 2022-04-21 NOTE — Progress Notes (Signed)
4 Days Post-Op  Subjective: Actively vomiting at time of my exam. States this started within a few minutes of me seeing him. NGT has bilious fluid in tubing but cannister is empty. Despite vomiting he denies abdominal pain and states nausea has resolved. Passing flatus. No BM  NG 200 cc/24h Objective: Vital signs in last 24 hours: Temp:  [97.9 F (36.6 C)-98.3 F (36.8 C)] 97.9 F (36.6 C) (09/09 0520) Pulse Rate:  [96-100] 99 (09/09 0520) Resp:  [16-18] 18 (09/09 0520) BP: (140-146)/(80-84) 140/84 (09/09 0520) SpO2:  [90 %-95 %] 95 % (09/09 0520) Last BM Date : 04/18/22  Intake/Output from previous day: 09/08 0701 - 09/09 0700 In: 191.4 [P.O.:140; I.V.:1.4; NG/GT:50] Out: 1680 [Urine:1480; Emesis/NG output:200] Intake/Output this shift: No intake/output data recorded.  PE: Gen:  Alert, cooperative. Diaphoretic and vomiting bilious fluid Abd: significant distension distension but still soft, appropriately tender around incision only. Hypoactive tinkering bowel sounds. Midline wound with honeycomb dressing in place over staples with SS drainage on distal portion.   Ng in place - nothing in cannister and not functional with manipulation  Lab Results:  Recent Labs    04/20/22 0500 04/21/22 0500  WBC 12.7* 16.9*  HGB 8.9* 9.3*  HCT 26.5* 27.4*  PLT 309 318    BMET Recent Labs    04/20/22 0500 04/21/22 0500  NA 139 138  K 3.8 4.1  CL 107 107  CO2 25 22  GLUCOSE 153* 185*  BUN 16 18  CREATININE 0.82 0.78  CALCIUM 8.4* 8.6*    PT/INR No results for input(s): "LABPROT", "INR" in the last 72 hours. CMP     Component Value Date/Time   NA 138 04/21/2022 0500   NA 142 11/15/2017 1646   K 4.1 04/21/2022 0500   CL 107 04/21/2022 0500   CO2 22 04/21/2022 0500   GLUCOSE 185 (H) 04/21/2022 0500   BUN 18 04/21/2022 0500   BUN 15 11/15/2017 1646   CREATININE 0.78 04/21/2022 0500   CREATININE 1.14 09/27/2016 1532   CALCIUM 8.6 (L) 04/21/2022 0500   PROT 5.2  (L) 04/19/2022 0045   PROT 7.3 10/07/2017 0954   ALBUMIN 2.2 (L) 04/19/2022 0045   ALBUMIN 4.4 10/07/2017 0954   AST 27 04/19/2022 0045   ALT 36 04/19/2022 0045   ALKPHOS 47 04/19/2022 0045   BILITOT 1.4 (H) 04/19/2022 0045   BILITOT 0.6 10/07/2017 0954   GFRNONAA >60 04/21/2022 0500   GFRNONAA 71 09/27/2016 1532   GFRAA >60 01/18/2018 0526   GFRAA 82 09/27/2016 1532   Lipase  No results found for: "LIPASE"  Studies/Results: DG Abd Portable 1V  Result Date: 04/20/2022 CLINICAL DATA:  Confirm NG tube placement EXAM: PORTABLE ABDOMEN - 1 VIEW COMPARISON:  Radiograph 04/16/2022 FINDINGS: Nasogastric tube side port overlies the distal esophagus. There are multiple dilated small bowel in the abdomen. New midline skin staples. Low lung volumes with bibasilar atelectasis. IMPRESSION: Nasogastric tube side port overlies the distal esophagus, recommend advancement by 9.0 cm. Multiple dilated small bowel in the abdomen, compatible with postoperative ileus. Electronically Signed   By: Caprice Renshaw M.D.   On: 04/20/2022 11:37    Anti-infectives: Anti-infectives (From admission, onward)    Start     Dose/Rate Route Frequency Ordered Stop   04/17/22 1100  ceFAZolin (ANCEF) IVPB 2g/100 mL premix        2 g 200 mL/hr over 30 Minutes Intravenous On call to O.R. 04/17/22 1045 04/17/22 1150   04/09/22 1100  Ampicillin-Sulbactam (UNASYN) 3 g in sodium chloride 0.9 % 100 mL IVPB  Status:  Discontinued        3 g 200 mL/hr over 30 Minutes Intravenous Every 8 hours 04/09/22 1045 04/14/22 1419        Assessment/Plan POD 4 s/p Exploratory laparotomy, lysis of adhesions for SBO by Dr. Luisa Hart, 04/17/22 - Emesis this am. Getting xray to evaluate NGT location. Will need to be adjusted and resume to LIWS. Await return of bowel function - continue abdominal binder - AFVSS but WBC up to 16.9 today. Consider CT abdomen pending x ray  - Cont TPN - Bx Mesentery of SB reassuring with benign dense fibrovascular  stroma compatible with an adhesion - Mobilize for bowel function, PT. Okay with NGT clamped for mobilization. Currently recommending SNF. Plan is to return to [facility] at d/c.  - Pulm toilet   FEN - NPO, NGT, TPN, IVF per TRH VTE - SCDs, Lovenox ID - None currently. Afebrile. Monitor wbc, 14.3 > 11.7 > 12.7> 16.9. encourge IS! Foley - condom cath  - Per TRH -  New dx DM (DKA on presentation) AKI - resolved PNA - completed abx   LOS: 12 days    Eric Form , [facility] 04/21/2022, 9:18 AM Please see Amion for pager number during day hours 7:00am-4:30pm

## 2022-04-21 NOTE — Progress Notes (Signed)
PHARMACY - TOTAL PARENTERAL NUTRITION CONSULT NOTE   Indication: Small bowel obstruction/prolonged ileus  Patient Measurements: Height: 5\' 11"  (180.3 cm) Weight: 108.6 kg (239 lb 6.7 oz) IBW/kg (Calculated) : 75.3 TPN AdjBW (KG): 82.3 Body mass index is 33.39 kg/m. Usual Weight: 100-103 kg  Assessment:  61 yo M with newly diagnosed type 2 diabetes. Patient developed ileus 9/1 which then evolved into SBO now s/p ex lap 04/17/22. Pt was eating ~50% of dysphagia/carb diet from 8/28-9/1. NPO since 04/14/22. Anticipate prolonged ileus - pharmacy consulted for TPN.   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 Cl:Ac 1:1 Add standard MVI and trace elements to TPN Continue to Sensitive SSI but reduce q8h SSI and adjust as needed Continue Insulin regular 45u in TPN   Monitor TPN labs daily until  stable at goal and then on Mon/Thurs once stable Triglycerides daily ordered through Sunday   Thank you for involving pharmacy in this patient's care.  Thursday, PharmD, BCPS Clinical Pharmacist Clinical phone for 04/21/2022 until 3p is 06/21/2022 04/21/2022 7:14 AM

## 2022-04-22 ENCOUNTER — Inpatient Hospital Stay (HOSPITAL_COMMUNITY): Payer: Medicaid Other

## 2022-04-22 DIAGNOSIS — D62 Acute posthemorrhagic anemia: Secondary | ICD-10-CM | POA: Diagnosis not present

## 2022-04-22 DIAGNOSIS — N179 Acute kidney failure, unspecified: Secondary | ICD-10-CM | POA: Diagnosis not present

## 2022-04-22 DIAGNOSIS — R7989 Other specified abnormal findings of blood chemistry: Secondary | ICD-10-CM

## 2022-04-22 DIAGNOSIS — R609 Edema, unspecified: Secondary | ICD-10-CM | POA: Diagnosis not present

## 2022-04-22 DIAGNOSIS — G9341 Metabolic encephalopathy: Secondary | ICD-10-CM | POA: Diagnosis not present

## 2022-04-22 DIAGNOSIS — E1310 Other specified diabetes mellitus with ketoacidosis without coma: Secondary | ICD-10-CM | POA: Diagnosis not present

## 2022-04-22 LAB — CBC
HCT: 26.5 % — ABNORMAL LOW (ref 39.0–52.0)
Hemoglobin: 8.8 g/dL — ABNORMAL LOW (ref 13.0–17.0)
MCH: 34.6 pg — ABNORMAL HIGH (ref 26.0–34.0)
MCHC: 33.2 g/dL (ref 30.0–36.0)
MCV: 104.3 fL — ABNORMAL HIGH (ref 80.0–100.0)
Platelets: 312 10*3/uL (ref 150–400)
RBC: 2.54 MIL/uL — ABNORMAL LOW (ref 4.22–5.81)
RDW: 13.1 % (ref 11.5–15.5)
WBC: 16 10*3/uL — ABNORMAL HIGH (ref 4.0–10.5)
nRBC: 1.3 % — ABNORMAL HIGH (ref 0.0–0.2)

## 2022-04-22 LAB — BASIC METABOLIC PANEL
Anion gap: 9 (ref 5–15)
BUN: 17 mg/dL (ref 8–23)
CO2: 26 mmol/L (ref 22–32)
Calcium: 8.8 mg/dL — ABNORMAL LOW (ref 8.9–10.3)
Chloride: 105 mmol/L (ref 98–111)
Creatinine, Ser: 0.83 mg/dL (ref 0.61–1.24)
GFR, Estimated: >60 mL/min (ref >60)
Glucose, Bld: 169 mg/dL — ABNORMAL HIGH (ref 70–99)
Potassium: 4 mmol/L (ref 3.5–5.1)
Sodium: 140 mmol/L (ref 135–145)

## 2022-04-22 LAB — GLUCOSE, CAPILLARY
Glucose-Capillary: 174 mg/dL — ABNORMAL HIGH (ref 70–99)
Glucose-Capillary: 178 mg/dL — ABNORMAL HIGH (ref 70–99)
Glucose-Capillary: 175 mg/dL — ABNORMAL HIGH (ref 70–99)

## 2022-04-22 LAB — PHOSPHORUS: Phosphorus: 3.6 mg/dL (ref 2.5–4.6)

## 2022-04-22 LAB — MAGNESIUM: Magnesium: 2.4 mg/dL (ref 1.7–2.4)

## 2022-04-22 LAB — TRIGLYCERIDES: Triglycerides: 247 mg/dL — ABNORMAL HIGH (ref <150)

## 2022-04-22 MED ORDER — ENOXAPARIN SODIUM 60 MG/0.6ML IJ SOSY
0.5000 mg/kg | PREFILLED_SYRINGE | INTRAMUSCULAR | Status: DC
Start: 2022-04-22 — End: 2022-04-26
  Administered 2022-04-22 – 2022-04-25 (×4): 52.5 mg via SUBCUTANEOUS
  Filled 2022-04-22 (×4): qty 0.6

## 2022-04-22 MED ORDER — FUROSEMIDE 10 MG/ML IJ SOLN
40.0000 mg | Freq: Once | INTRAMUSCULAR | Status: AC
  Administered 2022-04-22: 40 mg via INTRAVENOUS
  Filled 2022-04-22: qty 4

## 2022-04-22 MED ORDER — INSULIN ASPART 100 UNIT/ML IJ SOLN
0.0000 [IU] | Freq: Three times a day (TID) | INTRAMUSCULAR | Status: DC
  Administered 2022-04-22: 2 [IU] via SUBCUTANEOUS
  Administered 2022-04-22: 3 [IU] via SUBCUTANEOUS
  Administered 2022-04-23: 5 [IU] via SUBCUTANEOUS

## 2022-04-22 MED ORDER — TRAVASOL 10 % IV SOLN
INTRAVENOUS | Status: AC
  Filled 2022-04-22: qty 1299.6

## 2022-04-22 MED ORDER — ENOXAPARIN SODIUM 40 MG/0.4ML IJ SOSY: 40.0000 mg | PREFILLED_SYRINGE | INTRAMUSCULAR | Status: DC

## 2022-04-22 NOTE — Progress Notes (Signed)
PHARMACY - TOTAL PARENTERAL NUTRITION CONSULT NOTE   Indication: Small bowel obstruction/prolonged ileus  Patient Measurements: Height: 5\' 11"  (180.3 cm) Weight: 103.9 kg (229 lb 0.9 oz) IBW/kg (Calculated) : 75.3 TPN AdjBW (KG): 82.3 Body mass index is 31.95 kg/m. Usual Weight: 100-103 kg  Assessment:  61 yo M with newly diagnosed type 2 diabetes. Patient developed ileus 9/1 which then evolved into SBO now s/p ex lap 04/17/22. Pt was eating ~50% of dysphagia/carb diet from 8/28-9/1. NPO since 04/14/22. Anticipate prolonged ileus - pharmacy consulted for TPN.   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 Cl:Ac 1:1 Add standard MVI and trace elements to TPN Increase to Moderate SSI and continue q8h SSI and adjust as  needed Continue Insulin regular 45u in TPN   Monitor TPN labs Mon/Thurs, and as indicated  Thank you for involving pharmacy in this patient's care.  06/18/22, PharmD, BCPS Clinical Pharmacist Clinical phone for 04/22/2022 until 3p is 06/22/2022 04/22/2022 7:12 AM

## 2022-04-22 NOTE — Progress Notes (Signed)
5 Days Post-Op  Subjective: NG advanced yesterday after KUB, CT chest shows appropriate position in stomach. Output is nonbilious this morning. One BM charted for yesterday.   Objective: Vital signs in last 24 hours: Temp:  [97.4 F (36.3 C)-99.1 F (37.3 C)] 97.4 F (36.3 C) (09/10 0456) Pulse Rate:  [101-115] 101 (09/10 0456) Resp:  [18-26] 18 (09/10 0456) BP: (129-152)/(70-79) 138/71 (09/10 0456) SpO2:  [91 %-94 %] 94 % (09/10 0456) Weight:  [103.9 kg] 103.9 kg (09/10 0456) Last BM Date : 04/21/22  Intake/Output from previous day: 09/09 0701 - 09/10 0700 In: 4454.8 [P.O.:60; I.V.:4394.8] Out: 4000 [Urine:2475; Emesis/NG output:1525] Intake/Output this shift: No intake/output data recorded.  PE: Gen:  Alert, NAD Abd: Distended but soft and nontender. Midline wound clean and dry with staples in place. HEENT: NG in place draining gastric contents  Lab Results:  Recent Labs    04/21/22 0500 04/22/22 0515  WBC 16.9* 16.0*  HGB 9.3* 8.8*  HCT 27.4* 26.5*  PLT 318 312   BMET Recent Labs    04/21/22 0500 04/22/22 0515  NA 138 140  K 4.1 4.0  CL 107 105  CO2 22 26  GLUCOSE 185* 169*  BUN 18 17  CREATININE 0.78 0.83  CALCIUM 8.6* 8.8*   PT/INR No results for input(s): "LABPROT", "INR" in the last 72 hours. CMP     Component Value Date/Time   NA 140 04/22/2022 0515   NA 142 11/15/2017 1646   K 4.0 04/22/2022 0515   CL 105 04/22/2022 0515   CO2 26 04/22/2022 0515   GLUCOSE 169 (H) 04/22/2022 0515   BUN 17 04/22/2022 0515   BUN 15 11/15/2017 1646   CREATININE 0.83 04/22/2022 0515   CREATININE 1.14 09/27/2016 1532   CALCIUM 8.8 (L) 04/22/2022 0515   PROT 5.2 (L) 04/19/2022 0045   PROT 7.3 10/07/2017 0954   ALBUMIN 2.2 (L) 04/19/2022 0045   ALBUMIN 4.4 10/07/2017 0954   AST 27 04/19/2022 0045   ALT 36 04/19/2022 0045   ALKPHOS 47 04/19/2022 0045   BILITOT 1.4 (H) 04/19/2022 0045   BILITOT 0.6 10/07/2017 0954   GFRNONAA >60 04/22/2022 0515    GFRNONAA 71 09/27/2016 1532   GFRAA >60 01/18/2018 0526   GFRAA 82 09/27/2016 1532   Lipase  No results found for: "LIPASE"  Studies/Results: CT Angio Chest Pulmonary Embolism (PE) W or WO Contrast  Result Date: 04/21/2022 CLINICAL DATA:  Positive D-dimer EXAM: CT ANGIOGRAPHY CHEST WITH CONTRAST TECHNIQUE: Multidetector CT imaging of the chest was performed using the standard protocol during bolus administration of intravenous contrast. Multiplanar CT image reconstructions and MIPs were obtained to evaluate the vascular anatomy. RADIATION DOSE REDUCTION: This exam was performed according to the departmental dose-optimization program which includes automated exposure control, adjustment of the mA and/or kV according to patient size and/or use of iterative reconstruction technique. CONTRAST:  OMNIPAQUE IOHEXOL 350 MG/ML SOLN COMPARISON:  Chest CT 04/09/2022.  CT abdomen and pelvis 04/14/2022 FINDINGS: Cardiovascular: Satisfactory opacification of the pulmonary arteries to the segmental level. No evidence of pulmonary embolism. The heart is enlarged. There is no pericardial effusion. Aorta is normal in size. Mediastinum/Nodes: There is a 2 cm nodule in the isthmus of the thyroid gland no enlarged mediastinal or hilar lymph nodes. Enteric tube is seen throughout the esophagus. Lungs/Pleura: There is a new small right pleural effusion. There is bilateral lower lobe airspace disease, right greater than left. This appears similar to the prior study. There  is new atelectasis in the right middle lobe. There also new patchy ground-glass opacities throughout both lungs. No evidence for pneumothorax. Upper Abdomen: There are dilated fluid-filled small bowel loops in the upper abdomen measuring up to 4.8 cm. Enteric tube tip is in the body of the stomach. Musculoskeletal: No chest wall abnormality. No acute or significant osseous findings. Review of the MIP images confirms the above findings. IMPRESSION: 1. No  evidence for pulmonary embolism. 2. New trace right pleural effusion. 3. New patchy bilateral ground-glass opacities may represent edema or infection. 4. Stable bilateral lower lobe consolidation. 5. Dilated small bowel loops in the upper abdomen concerning for small bowel obstruction as seen on comparison CT. 6. 2 cm incidental thyroid nodule with nearby suspicious lymph nodes. Recommend nonemergent thyroid US. Reference: J Am Coll Radiol. 2015 Feb;12(2): 143-50 Electronically Signed   By: Darliss Cheney M.D.   On: 04/21/2022 23:00   DG Abd Portable 1V  Result Date: 04/21/2022 CLINICAL DATA:  Nasogastric tube placement. EXAM: PORTABLE ABDOMEN - 1 VIEW COMPARISON:  04/20/2022 FINDINGS: Nasogastric tube tip unchanged in the region of the gastroesophageal junction with side port over the distal esophagus. This could be advanced approximately 8 cm. Remainder of the exam is unchanged. IMPRESSION: Nasogastric tube unchanged with tip over the region of the gastroesophageal junction. This could be advanced approximately 8 cm. Electronically Signed   By: Elberta Fortis M.D.   On: 04/21/2022 10:43    Anti-infectives: Anti-infectives (From admission, onward)    Start     Dose/Rate Route Frequency Ordered Stop   04/17/22 1100  ceFAZolin (ANCEF) IVPB 2g/100 mL premix        2 g 200 mL/hr over 30 Minutes Intravenous On call to O.R. 04/17/22 1045 04/17/22 1150   04/09/22 1100  Ampicillin-Sulbactam (UNASYN) 3 g in sodium chloride 0.9 % 100 mL IVPB  Status:  Discontinued        3 g 200 mL/hr over 30 Minutes Intravenous Every 8 hours 04/09/22 1045 04/14/22 1419        Assessment/Plan POD 5 s/p Exploratory laparotomy, lysis of adhesions for SBO by Dr. Luisa Hart, 04/17/22 - KUB yesterday shows dilated small bowel, likely postoperative ileus. NG is now functioning, high output in last 24 hours. Keep in place to suction. - continue abdominal binder - AFVSS, WBC stable at 16 - Cont TPN - Bx Mesentery of SB reassuring  with benign dense fibrovascular stroma compatible with an adhesion - Mobilize for bowel function, PT. Okay with NGT clamped for mobilization. Currently recommending SNF. Plan is to return to [facility] at d/c.  - Pulm toilet   FEN - NPO, NGT, TPN, IVF per [facility] VTE - SCDs; Lovenox held due to anemia. Hgb stable today, would consider resuming chemical DVT ppx. ID - None currently. Afebrile. Monitor wbc. Foley - condom cath  - Per [facility] -  New dx DM (DKA on presentation) AKI - resolved PNA - completed abx   LOS: 13 days    Fritzi Mandes , MD [facility] 04/22/2022, 11:49 AM Please see Amion for pager number during day hours 7:00am-4:30pm

## 2022-04-22 NOTE — Progress Notes (Signed)
VASCULAR LAB    Bilateral lower extremity venous duplex has been performed.  See CV proc for preliminary results.   Martinez Boxx, RVT 04/22/2022, 1:46 PM

## 2022-04-22 NOTE — Progress Notes (Signed)
Progress Note  Patient: Cody [name] DXI:[id number] DOB: 12/08/1960  DOA: 04/09/2022  DOS: 04/22/2022    Brief hospital course: Cody [name] is a 61 y.o. male with a history of GERD, allergic rhinitis who presented 8/28 from [facility] SNF with confusion found to be in DKA (new diagnosis of diabetes). Neuroimaging showed no stroke. the patient was also hypoxic, diagnosed with aspiration pneumonia and improved on antibiotics. DKA, AKI, encephalopathy all resolved.   He developed abdominal pain and distention, vomiting with SBO shown on CT. Surgery was consulted, performed ex lap with LOA by Dr. Luisa Hart on 9/5 with biopsy suggestive of adhesion. TPN has been started, exam and imaging consistent with persistent ileus.   Assessment and Plan: SBO: s/p ex lap, LOA by Dr. Luisa Hart 9/5. Biopsy reassuring with benign dense fibrovascular stroma suggestive of adhesion.  - NGT output significant, 1,750/24hrs. Continue to wall suction - Continue TPN (started 9/6).  - OOB!   T2DM newly diagnosed presenting with DKA, hyperglycemia:: A1c 11.7%. C-peptide low at 0.6, consider recheck/stim testing outside scope of DKA.  - Continue SSI-sensitive every 4 hours while n.p.o. - Further adjustment as appropriate. - Education ongoing.   Hyperlipidemia: LDL 151   - Restart statin once taking po.   Acute kidney injury: In the setting of DKA.  Resolved. - Avoid nephrotoxins  Acute metabolic encephalopathy/strokelike symptoms: CVA work-up unrevealing.  EEG unremarkable. Resolved.   Severe sepsis with acute hypoxic respiratory failure due to aspiration pneumonia: POA. Sepsis physiology resolved, PCT low and trended downward, has completed unasyn x5 days.  - Monitor off further abx  Acute hypoxic respiratory failure: Initially related to aspiration pneumonia and sepsis. ?if now also due to atelectasis. I/O as documented show net negative, though weights up 98kg > 108kg. TTE with normal LVEF and G1 DD. - Weight  down 108 > 103kg. Significant UOP with lasix, though intake is 3.6L/day on TPN. - Improving down to 3L with lasix, will repeat today. CTA showed no PE after d-dimer elevated. There were opacities suggestive of infiltrate vs. edema.   - Encourage incentive spirometry!   Acute blood loss anemia: Hgb dropped 15g/dl on admission >> 7.6B/HA. Seems to be stabilizing. Anemia panel without deficiency. Urine is dark, but without hgb or RBCs on analysis. No melena/hematochezia. ?if related to surgery.  - nRBC showing expected marrow response. - Stopped prophylactic lovenox, started SCDs. With appearance of stability and no nidus of bleeding, will restart lovenox. Checking LE U/S for r/o DVT given grossly elevated d-dimer to inform dose (prophylactic vs. therapeutic).   Sinus tachycardia: Improved. - IV metoprolol to 5 mg as needed HR > 120.   GERD: - Continue PPI   Mild hypernatremia/hypercalcemia: Likely from dehydration.  Resolved.   Hypokalemia: Resolved.   Tobacco use disorder - Encourage cessation - Continue nicotine patch   Generalized weakness/physical deconditioning - PT/OT recommended return to SNF for further therapy   Leukocytosis/bandemia: Likely demargination.  - Continue monitoring  Obesity: Estimated body mass index is 31.95 kg/m as calculated from the following:   Height as of this encounter: 5\' 11"  (1.803 m).   Weight as of this encounter: 103.9 kg.  Subjective: No nausea, vomiting, abdominal pain currently. Denies dyspnea, has come down on O2 to 3L. No chest pain. No bleeding or other complaints.  Objective: Vitals:   04/21/22 1721 04/21/22 2023 04/22/22 0456 04/22/22 0800  BP: 133/79 129/70 138/71   Pulse: (!) 110 (!) 114 (!) 101 (!) 103  Resp: (!) 26 20  18   Temp: 99.1 F (37.3 C) (!) 97.4 F (36.3 C) (!) 97.4 F (36.3 C)   TempSrc: Oral Oral Oral   SpO2: 92% 91% 94% 92%  Weight:   103.9 kg   Height:       Gen: 61 y.o. male in no distress Pulm: Nonlabored  breathing supplemental oxygen. Crackles at bases. CV: Regular tachycardia without murmur, rub, or gallop. No JVD, no significant dependent edema. GI: Abdomen distended not significantly tender with hypoactive bowel sounds.  Ext: Warm, no deformities Skin: No new rashes, lesions or ulcers on visualized skin. Neuro: Alert and oriented. No focal neurological deficits. Psych: Judgement and insight appear fair. Mood euthymic & affect congruent. Behavior is appropriate.    Data Personally reviewed: CBC: Recent Labs  Lab 04/16/22 0200 04/17/22 0344 04/18/22 0456 04/19/22 0045 04/19/22 1458 04/20/22 0500 04/21/22 0500 04/22/22 0515  WBC 10.6* 10.6*   < > 14.3* 11.7* 12.7* 16.9* 16.0*  NEUTROABS 6.2 5.2  --   --   --   --   --   --   HGB 14.1 11.9*   < > 10.0* 9.4* 8.9* 9.3* 8.8*  HCT 41.3 35.4*   < > 29.5* 28.2* 26.5* 27.4* 26.5*  MCV 99.3 102.0*   < > 102.1* 105.2* 103.1* 101.9* 104.3*  PLT 322 305   < > 306 308 309 318 312   < > = values in this interval not displayed.   Basic Metabolic Panel: Recent Labs  Lab 04/18/22 0456 04/19/22 0045 04/20/22 0500 04/21/22 0500 04/22/22 0515  NA 139 136 139 138 140  K 4.3 3.5 3.8 4.1 4.0  CL 109 104 107 107 105  CO2 23 25 25 22 26   GLUCOSE 138* 160* 153* 185* 169*  BUN 19 15 16 18 17   CREATININE 1.06 1.03 0.82 0.78 0.83  CALCIUM 8.4* 8.0* 8.4* 8.6* 8.8*  MG 2.0 2.2 2.3 2.4 2.4  PHOS 3.2 2.2* 3.1 3.4 3.6   GFR: Estimated Creatinine Clearance: 113.2 mL/min (by C-G formula based on SCr of 0.83 mg/dL).  Liver Function Tests: Recent Labs  Lab 04/16/22 0200 04/17/22 0344 04/18/22 0456 04/19/22 0045  AST  --  23 29 27   ALT  --  23 35 36  ALKPHOS  --  53 50 47  BILITOT  --  1.3* 2.0* 1.4*  PROT  --  5.9* 5.5* 5.2*  ALBUMIN 3.3* 2.8* 2.5* 2.2*   Family Communication: None at bedside  Disposition: Status is: Inpatient Remains inpatient appropriate because: Ongoing SBO/ileus. Planned Discharge Destination:  Return to [facility], MD 04/22/2022 1:39 PM Page by [email or web address]

## 2022-04-23 DIAGNOSIS — N179 Acute kidney failure, unspecified: Secondary | ICD-10-CM | POA: Diagnosis not present

## 2022-04-23 DIAGNOSIS — G9341 Metabolic encephalopathy: Secondary | ICD-10-CM | POA: Diagnosis not present

## 2022-04-23 DIAGNOSIS — E1310 Other specified diabetes mellitus with ketoacidosis without coma: Secondary | ICD-10-CM | POA: Diagnosis not present

## 2022-04-23 DIAGNOSIS — D62 Acute posthemorrhagic anemia: Secondary | ICD-10-CM | POA: Diagnosis not present

## 2022-04-23 LAB — GLUCOSE, CAPILLARY
Glucose-Capillary: 209 mg/dL — ABNORMAL HIGH (ref 70–99)
Glucose-Capillary: 199 mg/dL — ABNORMAL HIGH (ref 70–99)
Glucose-Capillary: 166 mg/dL — ABNORMAL HIGH (ref 70–99)
Glucose-Capillary: 201 mg/dL — ABNORMAL HIGH (ref 70–99)

## 2022-04-23 LAB — CBC
HCT: 28.7 % — ABNORMAL LOW (ref 39.0–52.0)
Hemoglobin: 9.2 g/dL — ABNORMAL LOW (ref 13.0–17.0)
MCH: 34.1 pg — ABNORMAL HIGH (ref 26.0–34.0)
MCHC: 32.1 g/dL (ref 30.0–36.0)
MCV: 106.3 fL — ABNORMAL HIGH (ref 80.0–100.0)
Platelets: 316 10*3/uL (ref 150–400)
RBC: 2.7 MIL/uL — ABNORMAL LOW (ref 4.22–5.81)
RDW: 13.2 % (ref 11.5–15.5)
WBC: 16.1 10*3/uL — ABNORMAL HIGH (ref 4.0–10.5)
nRBC: 1.4 % — ABNORMAL HIGH (ref 0.0–0.2)

## 2022-04-23 LAB — COMPREHENSIVE METABOLIC PANEL
ALT: 105 U/L — ABNORMAL HIGH (ref 0–44)
AST: 51 U/L — ABNORMAL HIGH (ref 15–41)
Albumin: 2.7 g/dL — ABNORMAL LOW (ref 3.5–5.0)
Alkaline Phosphatase: 106 U/L (ref 38–126)
Anion gap: 9 (ref 5–15)
BUN: 19 mg/dL (ref 8–23)
CO2: 25 mmol/L (ref 22–32)
Calcium: 9.1 mg/dL (ref 8.9–10.3)
Chloride: 105 mmol/L (ref 98–111)
Creatinine, Ser: 0.84 mg/dL (ref 0.61–1.24)
GFR, Estimated: >60 mL/min (ref >60)
Glucose, Bld: 200 mg/dL — ABNORMAL HIGH (ref 70–99)
Potassium: 4.2 mmol/L (ref 3.5–5.1)
Sodium: 139 mmol/L (ref 135–145)
Total Bilirubin: 0.8 mg/dL (ref 0.3–1.2)
Total Protein: 6.5 g/dL (ref 6.5–8.1)

## 2022-04-23 LAB — PHOSPHORUS: Phosphorus: 3.7 mg/dL (ref 2.5–4.6)

## 2022-04-23 LAB — TRIGLYCERIDES: Triglycerides: 312 mg/dL — ABNORMAL HIGH (ref <150)

## 2022-04-23 LAB — MAGNESIUM: Magnesium: 2.4 mg/dL (ref 1.7–2.4)

## 2022-04-23 MED ORDER — TRAVASOL 10 % IV SOLN
INTRAVENOUS | Status: AC
  Filled 2022-04-23: qty 1299.6

## 2022-04-23 MED ORDER — FUROSEMIDE 10 MG/ML IJ SOLN
40.0000 mg | Freq: Every day | INTRAMUSCULAR | Status: DC
  Administered 2022-04-23 – 2022-05-03 (×11): 40 mg via INTRAVENOUS
  Filled 2022-04-23 (×11): qty 4

## 2022-04-23 MED ORDER — INSULIN ASPART 100 UNIT/ML IJ SOLN
0.0000 [IU] | Freq: Four times a day (QID) | INTRAMUSCULAR | Status: DC
  Administered 2022-04-23: 5 [IU] via SUBCUTANEOUS
  Administered 2022-04-23 – 2022-04-24 (×3): 3 [IU] via SUBCUTANEOUS

## 2022-04-23 NOTE — Progress Notes (Signed)
Nutrition Follow-up  DOCUMENTATION CODES:   Not applicable  INTERVENTION:   TPN management per pharmacy Advance diet as medically appropriate  NUTRITION DIAGNOSIS:   Increased nutrient needs related to post-op healing as evidenced by estimated needs. - Ongoing  GOAL:   Patient will meet greater than or equal to 90% of their needs - Being addressed via TPN  MONITOR:   Labs, I & O's, Diet advancement, Weight trends  REASON FOR ASSESSMENT:   Consult New TPN/TNA  ASSESSMENT:   61 y.o. male presented to the ED with AMS from assisted living facility. PMH includes GERD. Pt admitted with DKA (new diagnosed DM) and AKI. Later developed an ileus vs SBO, requiring surgery.   9/03 - NGT placed to LIWS 9/05 - Ex lap s/p lysis of adhesions; NGT replaced to LIWS 9/06 - TPN started   Pt sleeping at time of RD visit. Woke to RD touch. Denies any nausea or vomiting. Reports having a bowel movement. Pt still with NGT placed to LIWS.  Medications reviewed and include: Doculax, Lasix, NovoLog, Protonix Labs reviewed: 24 hr CBG 166-209  NGT output: 700 mL x 24 hours  Diet Order:   Diet Order             Diet NPO time specified Except for: Ice Chips, Other (See Comments)  Diet effective now                   EDUCATION NEEDS:   No education needs have been identified at this time  Skin:  Skin Assessment: Skin Integrity Issues: Skin Integrity Issues:: Incisions Incisions: Abdomen  Last BM:  9/10 - Type 7  Height:   Ht Readings from Last 1 Encounters:  04/09/22 5\' 11"  (1.803 m)    Weight:   Wt Readings from Last 1 Encounters:  04/23/22 103.1 kg    BMI:  Body mass index is 31.7 kg/m.  Estimated Nutritional Needs:   Kcal:  2200-2400  Protein:  110-125 grams  Fluid:  >/= 2 L    06/23/22 RD, LDN Clinical Dietitian See [facility] for contact information.

## 2022-04-23 NOTE — Progress Notes (Signed)
6 Days Post-Op  Subjective: CC: Patient reports he had a formed bm yesterday and is not passing flatus. Nurse reports it was a loose bm and he has been passing flatus. NGT output feculent appearing. Reports he is nauseated and feels bloated/distended.   Objective: Vital signs in last 24 hours: Temp:  [98 F (36.7 C)-99 F (37.2 C)] 98 F (36.7 C) (09/11 0759) Pulse Rate:  [107-111] 110 (09/11 0759) Resp:  [19-20] 20 (09/11 0759) BP: (126-138)/(78-88) 129/87 (09/11 0759) SpO2:  [90 %-94 %] 92 % (09/11 0759) Weight:  [103.1 kg] 103.1 kg (09/11 0400) Last BM Date : 04/22/22  Intake/Output from previous day: 09/10 0701 - 09/11 0700 In: 1405.4 [P.O.:180; I.V.:1225.4] Out: 1950 [Urine:1250; Emesis/NG output:700] Intake/Output this shift: Total I/O In: 94.9 [I.V.:94.9] Out: 400 [Urine:400]  PE: Gen:  Alert, NAD Abd: At least moderate distension but still soft, appropriately tender around incision only. Hypoactive bowel sounds. Midline wound with staples in place. Some dried crusting over the inferior aspect of the wound but no active drainage and the RN reports there has been no drainage from the wound on her shift or in sign out. NGT with feculent appearing with 600cc output in cannister.   Lab Results:  Recent Labs    04/22/22 0515 04/23/22 0401  WBC 16.0* 16.1*  HGB 8.8* 9.2*  HCT 26.5* 28.7*  PLT 312 316   BMET Recent Labs    04/22/22 0515 04/23/22 0401  NA 140 139  K 4.0 4.2  CL 105 105  CO2 26 25  GLUCOSE 169* 200*  BUN 17 19  CREATININE 0.83 0.84  CALCIUM 8.8* 9.1   PT/INR No results for input(s): "LABPROT", "INR" in the last 72 hours. CMP     Component Value Date/Time   NA 139 04/23/2022 0401   NA 142 11/15/2017 1646   K 4.2 04/23/2022 0401   CL 105 04/23/2022 0401   CO2 25 04/23/2022 0401   GLUCOSE 200 (H) 04/23/2022 0401   BUN 19 04/23/2022 0401   BUN 15 11/15/2017 1646   CREATININE 0.84 04/23/2022 0401   CREATININE 1.14 09/27/2016 1532    CALCIUM 9.1 04/23/2022 0401   PROT 6.5 04/23/2022 0401   PROT 7.3 10/07/2017 0954   ALBUMIN 2.7 (L) 04/23/2022 0401   ALBUMIN 4.4 10/07/2017 0954   AST 51 (H) 04/23/2022 0401   ALT 105 (H) 04/23/2022 0401   ALKPHOS 106 04/23/2022 0401   BILITOT 0.8 04/23/2022 0401   BILITOT 0.6 10/07/2017 0954   GFRNONAA >60 04/23/2022 0401   GFRNONAA 71 09/27/2016 1532   GFRAA >60 01/18/2018 0526   GFRAA 82 09/27/2016 1532   Lipase  No results found for: "LIPASE"  Studies/Results: VAS US LOWER EXTREMITY VENOUS (DVT)  Result Date: 04/22/2022  Lower Venous DVT Study Patient Name:  Cody [name]  Date of Exam:   04/22/2022 Medical Rec #: [id number]       Accession #:    [id number] Date of Birth: 03/08/1961       Patient Gender: M Patient Age:   [age] years [facility] Procedure:      VAS US LOWER EXTREMITY VENOUS (DVT) Referring Phys: Hazeline JunkerYAN GRUNZ --------------------------------------------------------------------------------  Indications: Edema, and elevated d-dimer.  Comparison Study: No prior study Performing Technologist: Sherren Kernsandace Kanady RVS  Examination Guidelines: A complete evaluation includes B-mode imaging, spectral Doppler, color Doppler, and power Doppler as needed of all accessible portions of each vessel. Bilateral testing is considered an integral part  of a complete examination. Limited examinations for reoccurring indications may be performed as noted. The reflux portion of the exam is performed with the patient in reverse Trendelenburg.  +---------+---------------+---------+-----------+----------+--------------+ RIGHT    CompressibilityPhasicitySpontaneityPropertiesThrombus Aging +---------+---------------+---------+-----------+----------+--------------+ CFV      Full           Yes      Yes                                 +---------+---------------+---------+-----------+----------+--------------+ SFJ      Full                                                         +---------+---------------+---------+-----------+----------+--------------+ FV Prox  Full                                                        +---------+---------------+---------+-----------+----------+--------------+ FV Mid   Full                                                        +---------+---------------+---------+-----------+----------+--------------+ FV DistalFull                                                        +---------+---------------+---------+-----------+----------+--------------+ PFV      Full                                                        +---------+---------------+---------+-----------+----------+--------------+ POP      Full           Yes      Yes                                 +---------+---------------+---------+-----------+----------+--------------+ PTV      Full                                                        +---------+---------------+---------+-----------+----------+--------------+ PERO     Full                                                        +---------+---------------+---------+-----------+----------+--------------+   +---------+---------------+---------+-----------+----------+--------------+ LEFT     CompressibilityPhasicitySpontaneityPropertiesThrombus Aging +---------+---------------+---------+-----------+----------+--------------+ CFV      Full  Yes      Yes                                 +---------+---------------+---------+-----------+----------+--------------+ SFJ      Full                                                        +---------+---------------+---------+-----------+----------+--------------+ FV Prox  Full                                                        +---------+---------------+---------+-----------+----------+--------------+ FV Mid   Full                                                         +---------+---------------+---------+-----------+----------+--------------+ FV DistalFull                                                        +---------+---------------+---------+-----------+----------+--------------+ PFV      Full                                                        +---------+---------------+---------+-----------+----------+--------------+ POP      Full           Yes      Yes                                 +---------+---------------+---------+-----------+----------+--------------+ PTV      Full                                                        +---------+---------------+---------+-----------+----------+--------------+ PERO     Full                                                        +---------+---------------+---------+-----------+----------+--------------+     Summary: RIGHT: - There is no evidence of deep vein thrombosis in the lower extremity.  - No cystic structure found in the popliteal fossa.  LEFT: - There is no evidence of deep vein thrombosis in the lower extremity.  - No cystic structure found in the popliteal fossa.  *See table(s) above for measurements and observations.    Preliminary    CT Angio Chest  Pulmonary Embolism (PE) W or WO Contrast  Result Date: 04/21/2022 CLINICAL DATA:  Positive D-dimer EXAM: CT ANGIOGRAPHY CHEST WITH CONTRAST TECHNIQUE: Multidetector CT imaging of the chest was performed using the standard protocol during bolus administration of intravenous contrast. Multiplanar CT image reconstructions and MIPs were obtained to evaluate the vascular anatomy. RADIATION DOSE REDUCTION: This exam was performed according to the departmental dose-optimization program which includes automated exposure control, adjustment of the mA and/or kV according to patient size and/or use of iterative reconstruction technique. CONTRAST:  164mL OMNIPAQUE IOHEXOL 350 MG/ML SOLN COMPARISON:  Chest CT 04/09/2022.  CT abdomen and pelvis  04/14/2022 FINDINGS: Cardiovascular: Satisfactory opacification of the pulmonary arteries to the segmental level. No evidence of pulmonary embolism. The heart is enlarged. There is no pericardial effusion. Aorta is normal in size. Mediastinum/Nodes: There is a 2 cm nodule in the isthmus of the thyroid gland no enlarged mediastinal or hilar lymph nodes. Enteric tube is seen throughout the esophagus. Lungs/Pleura: There is a new small right pleural effusion. There is bilateral lower lobe airspace disease, right greater than left. This appears similar to the prior study. There is new atelectasis in the right middle lobe. There also new patchy ground-glass opacities throughout both lungs. No evidence for pneumothorax. Upper Abdomen: There are dilated fluid-filled small bowel loops in the upper abdomen measuring up to 4.8 cm. Enteric tube tip is in the body of the stomach. Musculoskeletal: No chest wall abnormality. No acute or significant osseous findings. Review of the MIP images confirms the above findings. IMPRESSION: 1. No evidence for pulmonary embolism. 2. New trace right pleural effusion. 3. New patchy bilateral ground-glass opacities may represent edema or infection. 4. Stable bilateral lower lobe consolidation. 5. Dilated small bowel loops in the upper abdomen concerning for small bowel obstruction as seen on comparison CT. 6. 2 cm incidental thyroid nodule with nearby suspicious lymph nodes. Recommend nonemergent thyroid US. Reference: J Am Coll Radiol. 2015 Feb;12(2): 143-50 Electronically Signed   By: Ronney Asters M.D.   On: 04/21/2022 23:00    Anti-infectives: Anti-infectives (From admission, onward)    Start     Dose/Rate Route Frequency Ordered Stop   04/17/22 1100  ceFAZolin (ANCEF) IVPB 2g/100 mL premix        2 g 200 mL/hr over 30 Minutes Intravenous On call to O.R. 04/17/22 1045 04/17/22 1150   04/09/22 1100  Ampicillin-Sulbactam (UNASYN) 3 g in sodium chloride 0.9 % 100 mL IVPB  Status:   Discontinued        3 g 200 mL/hr over 30 Minutes Intravenous Every 8 hours 04/09/22 1045 04/14/22 1419        Assessment/Plan POD 6 s/p Exploratory laparotomy, lysis of adhesions for SBO by Dr. Brantley Stage, 04/17/22 - Patient did have bm yesterday but still appears quite distended with hypoactive bowel sounds and NGT w/ high output. Keep to LIWS today - continue abdominal binder - AFVSS, WBC stable at 16.1 - Cont TPN - Bx Mesentery of SB reassuring with benign dense fibrovascular stroma compatible with an adhesion - Mobilize for bowel function, PT. Okay with NGT clamped for mobilization. Currently recommending SNF. Plan is to [facility] at d/c.  - Pulm toilet   FEN - NPO, NGT, TPN, IVF per TRH VTE - SCDs; Lovenox (hgb stable) ID - None currently. Afebrile. Monitor wbc.   - Per TRH -  New dx DM (DKA on presentation) AKI - resolved PNA - completed abx   LOS: 14 days  Jacinto Halim , [facility] Surgery 04/23/2022, 11:02 AM Please see Amion for pager number during day hours 7:00am-4:30pm

## 2022-04-23 NOTE — Progress Notes (Signed)
PHARMACY - TOTAL PARENTERAL NUTRITION CONSULT NOTE   Indication: Small bowel obstruction/prolonged ileus  Patient Measurements: Height: 5\' 11"  (180.3 cm) Weight: 103.1 kg (227 lb 4.7 oz) IBW/kg (Calculated) : 75.3 TPN AdjBW (KG): 82.3 Body mass index is 31.7 kg/m. Usual Weight: 100-103 kg  Assessment:  61 yo M with newly diagnosed type 2 diabetes. Patient developed ileus 9/1 which then evolved into SBO now s/p ex lap 04/17/22. Pt was eating ~50% of dysphagia/carb diet from 8/28-9/1. NPO since 04/14/22. Anticipate prolonged ileus - pharmacy consulted for TPN.   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 Cl:Ac 1:1 Add standard MVI and trace elements to TPN Continue moderate SSI Q8H and adjust as needed Increase Insulin regular to 50 units in  TPN  BMP and TG tomorrow Monitor TPN labs Mon/Thurs, and as indicated  Thank you for involving pharmacy in this patient's care.  06/18/22, PharmD Clinical Pharmacist 04/23/2022 9:56 AM

## 2022-04-23 NOTE — Progress Notes (Signed)
Progress Note  Patient: Cody [name] PJA:[id number] DOB: 22-Feb-1961  DOA: 04/09/2022  DOS: 04/23/2022    Brief [name] course: Cody [name] is a 61 y.o. male with a history of GERD, allergic rhinitis who presented 8/28 from [facility] SNF with confusion found to be in DKA (new diagnosis of diabetes). Neuroimaging showed no stroke. the patient was also hypoxic, diagnosed with aspiration pneumonia and improved on antibiotics. DKA, AKI, encephalopathy all resolved.   He developed abdominal pain and distention, vomiting with SBO shown on CT. Surgery was consulted, performed ex lap with LOA by Dr. Luisa [name] on 9/5 with biopsy suggestive of adhesion. TPN has been started, exam and imaging consistent with persistent ileus.   Assessment and Plan: SBO: s/p ex lap, LOA by Dr. Luisa [name] 9/5. Biopsy reassuring with benign dense fibrovascular stroma suggestive of adhesion.  - NGT output declining, but still fair. 500cc documented yesterday, 200 so far today and some in canister on my evaluation. Continue to wall suction for now pending surgery evaluation. - Continue TPN (started 9/6) thru PICC placed 9/6.  - OOB!   T2DM newly diagnosed presenting with DKA, hyperglycemia:: A1c 11.7%. C-peptide low at 0.6, consider recheck/stim testing outside scope of DKA/acute illness.  - Continue SSI-sensitive, was spread to every 8 hours, will increase frequency based on elevated values. - Further adjustment as appropriate. - Education ongoing.   Acute hypoxic respiratory failure: Initially related to aspiration pneumonia and sepsis. ?if now also due to atelectasis. I/O as documented show net negative, though weights up 98kg > 108kg. TTE with normal LVEF and G1 DD. - Weight down 108 > 103kg. ?incompletely charted UOP/24hrs. Cr stable. - Improving down to 2L with lasix, will repeat today. CTA showed no PE after d-dimer elevated. There were opacities suggestive of infiltrate vs. edema.   - Encourage incentive  spirometry!  Hyperlipidemia: LDL 151   - Restart statin once taking po.   Acute kidney injury: In the setting of DKA.  Resolved. - Avoid nephrotoxins  Acute metabolic encephalopathy/strokelike symptoms: CVA work-up unrevealing.  EEG unremarkable. Resolved.   Severe sepsis with acute hypoxic respiratory failure due to aspiration pneumonia: POA. Sepsis physiology resolved, PCT low and trended downward, has completed unasyn x5 days.  - Monitor off further abx  LFT elevation: Monitor later this week.   Acute blood loss anemia: Hgb dropped 15g/dl on admission >> 3.4L/PF. Seems to be stabilizing. Anemia panel without deficiency. Urine is dark, but without hgb or RBCs on analysis. No melena/hematochezia. ?if related to surgery.  - nRBC showing expected marrow response. - Continue monitoring. Ok to continue lovenox 0.5mg /kg q24h for VTE ppx (negative LE venous U/S and CTA chest).     Sinus tachycardia: Improved. - IV metoprolol to 5 mg as needed HR > 120.   GERD: - Continue PPI   Mild hypernatremia/hypercalcemia: Likely from dehydration.  Resolved.   Hypokalemia: Resolved.   Tobacco use disorder - Encourage cessation - Continue nicotine patch   Generalized weakness/physical deconditioning - PT/OT recommended return to SNF for further therapy   Leukocytosis/bandemia: Likely demargination. Stable. Afebrile. - Continue monitoring  Obesity: Estimated body mass index is 31.7 kg/m as calculated from the following:   Height as of this encounter: 5\' 11"  (1.803 m).   Weight as of this encounter: 103.1 kg.  Subjective: Denies N/V or abd pain. Having flatus. Wants to eat/drink. Denies dyspnea, chest pain, bleeding.  Objective: Vitals:   04/22/22 2148 04/23/22 0357 04/23/22 0400 04/23/22 0759  BP: 138/88 132/80  129/87  Pulse: (!) 111 (!) 107  (!) 110  Resp: 19 20  20   Temp: 99 F (37.2 C) 98 F (36.7 C)  98 F (36.7 C)  TempSrc: Oral Oral  Oral  SpO2: 94% 93%  92%  Weight:    103.1 kg   Height:       Gen: 61 y.o. male in no distress Pulm: Nonlabored breathing supplemental oxygen, crackles at bases bilaterally. CV: Regular tachycardia without murmur, rub, or gallop. No JVD, trace dependent edema. GI: Abdomen distended with high pitched bowel sounds. Not significantly tender. Ext: Warm, no deformities Skin: No rashes, lesions or ulcers on visualized skin. Neuro: Alert and oriented. No focal neurological deficits. Psych: Judgement and insight appear fair. Mood euthymic & affect congruent. Behavior is appropriate.    Data Personally reviewed: CBC: Recent Labs  Lab 04/17/22 0344 04/18/22 0456 04/19/22 1458 04/20/22 0500 04/21/22 0500 04/22/22 0515 04/23/22 0401  WBC 10.6*   < > 11.7* 12.7* 16.9* 16.0* 16.1*  NEUTROABS 5.2  --   --   --   --   --   --   HGB 11.9*   < > 9.4* 8.9* 9.3* 8.8* 9.2*  HCT 35.4*   < > 28.2* 26.5* 27.4* 26.5* 28.7*  MCV 102.0*   < > 105.2* 103.1* 101.9* 104.3* 106.3*  PLT 305   < > 308 309 318 312 316   < > = values in this interval not displayed.   Basic Metabolic Panel: Recent Labs  Lab 04/19/22 0045 04/20/22 0500 04/21/22 0500 04/22/22 0515 04/23/22 0401  NA 136 139 138 140 139  K 3.5 3.8 4.1 4.0 4.2  CL 104 107 107 105 105  CO2 25 25 22 26 25   GLUCOSE 160* 153* 185* 169* 200*  BUN 15 16 18 17 19   CREATININE 1.03 0.82 0.78 0.83 0.84  CALCIUM 8.0* 8.4* 8.6* 8.8* 9.1  MG 2.2 2.3 2.4 2.4 2.4  PHOS 2.2* 3.1 3.4 3.6 3.7   GFR: Estimated Creatinine Clearance: 111.4 mL/min (by C-G formula based on SCr of 0.84 mg/dL).  Liver Function Tests: Recent Labs  Lab 04/17/22 0344 04/18/22 0456 04/19/22 0045 04/23/22 0401  AST 23 29 27  51*  ALT 23 35 36 105*  ALKPHOS 53 50 47 106  BILITOT 1.3* 2.0* 1.4* 0.8  PROT 5.9* 5.5* 5.2* 6.5  ALBUMIN 2.8* 2.5* 2.2* 2.7*   Family Communication: None at bedside  Disposition: Status is: Inpatient Remains inpatient appropriate because: Ongoing SBO/ileus. Planned Discharge  Destination:  [facility], [facility] 04/23/2022 10:34 AM Page by [email or web address]

## 2022-04-23 NOTE — TOC Progression Note (Signed)
Transition of Care [facility]) - Progression Note    Patient Details  Name: Cody [name] MRN: [id number] Date of Birth: 03/26/1961  Transition of Care [facility]) CM/SW Contact  Delilah Shan, LCSWA Phone Number: 04/23/2022, 12:31 PM  Clinical Narrative:     Plan to return to [facility] when medically ready for dc. CSW will continue to follow and assist with patients dc planning needs.  Expected Discharge Plan: Skilled Nursing Facility Barriers to Discharge: Continued Medical Work up  Expected Discharge Plan and Services Expected Discharge Plan: Skilled Nursing Facility In-house Referral: Clinical Social Work     Living arrangements for the past 2 months: Skilled Nursing Facility                                       Social Determinants of Health (SDOH) Interventions    Readmission Risk Interventions     No data to display

## 2022-04-23 NOTE — Progress Notes (Signed)
Physical Therapy Treatment Patient Details Name: Cody [name] MRN: [id number] DOB: March 12, 1961 Today's Date: 04/23/2022   History of Present Illness 61 y.o. male presents to [facility] hospital on 04/09/2022 with AMS, found to be in DKA with newly diagnosed type 1 diabetes. Pt developed severe sepsis 2/2 with acute hypoxic respiratory failure 2/2 aspiration PNA, as well as ileus during admission. Pt underwent ex-lap with lysis of adhesions on 04/17/2022. PMH includes GERD, OA.    PT Comments    Pt making slow, steady progress with mobility. Needs encouragement to amb further. Pt c/o bilateral calf pain which I explained likely due to tight calves due to limited weight bearing in standing over the past several weeks. Continue to recommend return to SNF at dc.    Recommendations for follow up therapy are one component of a multi-disciplinary discharge planning process, led by the attending physician.  Recommendations may be updated based on patient status, additional functional criteria and insurance authorization.  Follow Up Recommendations  Skilled nursing-short term rehab (<3 hours/day) Can patient physically be transported by private vehicle: No   Assistance Recommended at Discharge Frequent or constant Supervision/Assistance  Patient can return home with the following A lot of help with bathing/dressing/bathroom;Help with stairs or ramp for entrance;Assist for transportation;Assistance with cooking/housework;A little help with walking and/or transfers   Equipment Recommendations  None recommended by PT    Recommendations for Other Services       Precautions / Restrictions Precautions Precautions: Fall Restrictions Weight Bearing Restrictions: No     Mobility  Bed Mobility Overal bed mobility: Needs Assistance Bed Mobility: Rolling, Sidelying to Sit Rolling: Min assist Sidelying to sit: Min assist       General bed mobility comments: Assist to elevated trunk in to sitting and bring  hips to EOB. Verbal cues for technique    Transfers Overall transfer level: Needs assistance Equipment used: Rolling walker (2 wheels) Transfers: Sit to/from Stand Sit to Stand: Mod assist, From elevated surface           General transfer comment: Assist to bring hips up    Ambulation/Gait Ambulation/Gait assistance: Min guard, +2 safety/equipment (chair follow to incr amb distance) Gait Distance (Feet): 45 Feet Assistive device: Rolling walker (2 wheels) Gait Pattern/deviations: Step-to pattern, Decreased step length - right, Decreased step length - left, Shuffle, Trunk flexed Gait velocity: decr Gait velocity interpretation: <1.31 ft/sec, indicative of household ambulator   General Gait Details: Assist for safety and lines. Verbal cues to stay closer to walker and stand more erect   Stairs             Wheelchair Mobility    Modified Rankin (Stroke Patients Only)       Balance Overall balance assessment: Needs assistance Sitting-balance support: No upper extremity supported, Feet supported Sitting balance-Leahy Scale: Fair     Standing balance support: Bilateral upper extremity supported, Reliant on assistive device for balance Standing balance-Leahy Scale: Poor Standing balance comment: walker and min guard for static standing                            Cognition Arousal/Alertness: Awake/alert Behavior During Therapy: Flat affect Overall Cognitive Status: Impaired/Different from baseline Area of Impairment: Problem solving                             Problem Solving: Slow processing, Requires verbal cues  Exercises      General Comments General comments (skin integrity, edema, etc.): HR to 120's with activity. SpO2 93% on RA after amb      Pertinent Vitals/Pain Pain Assessment Pain Assessment: Faces Faces Pain Scale: Hurts even more Pain Location: bil calves Pain Descriptors / Indicators: Tightness,  Sore Pain Intervention(s): Limited activity within patient's tolerance, Monitored during session    Home Living                          Prior Function            PT Goals (current goals can now be found in the care plan section) Acute Rehab PT Goals Patient Stated Goal: to reduce pain Progress towards PT goals: Progressing toward goals    Frequency    Min 3X/week      PT Plan Current plan remains appropriate    Co-evaluation              AM-PAC PT "6 Clicks" Mobility   Outcome Measure  Help needed turning from your back to your side while in a flat bed without using bedrails?: A Little Help needed moving from lying on your back to sitting on the side of a flat bed without using bedrails?: A Little Help needed moving to and from a bed to a chair (including a wheelchair)?: A Little Help needed standing up from a chair using your arms (e.g., wheelchair or bedside chair)?: A Lot Help needed to walk in hospital room?: A Little Help needed climbing 3-5 steps with a railing? : Total 6 Click Score: 15    End of Session Equipment Utilized During Treatment: Gait belt Activity Tolerance: Patient limited by fatigue Patient left: in chair;with call bell/phone within reach;with chair alarm set;with family/visitor present Nurse Communication: Mobility status;Other (comment) (left O2 off) PT Visit Diagnosis: Unsteadiness on feet (R26.81);Muscle weakness (generalized) (M62.81)     Time: 4098-1191 PT Time Calculation (min) (ACUTE ONLY): 25 min  Charges:  $Gait Training: 23-37 mins                     [facility] PT Acute Rehabilitation Services Office [phone]    Angelina Ok [facility] 04/23/2022, 11:18 AM

## 2022-04-24 ENCOUNTER — Inpatient Hospital Stay (HOSPITAL_COMMUNITY): Payer: Medicaid Other

## 2022-04-24 DIAGNOSIS — G9341 Metabolic encephalopathy: Secondary | ICD-10-CM | POA: Diagnosis not present

## 2022-04-24 DIAGNOSIS — D62 Acute posthemorrhagic anemia: Secondary | ICD-10-CM | POA: Diagnosis not present

## 2022-04-24 DIAGNOSIS — E1310 Other specified diabetes mellitus with ketoacidosis without coma: Secondary | ICD-10-CM | POA: Diagnosis not present

## 2022-04-24 DIAGNOSIS — N179 Acute kidney failure, unspecified: Secondary | ICD-10-CM | POA: Diagnosis not present

## 2022-04-24 LAB — GLUCOSE, CAPILLARY
Glucose-Capillary: 170 mg/dL — ABNORMAL HIGH (ref 70–99)
Glucose-Capillary: 173 mg/dL — ABNORMAL HIGH (ref 70–99)
Glucose-Capillary: 183 mg/dL — ABNORMAL HIGH (ref 70–99)
Glucose-Capillary: 188 mg/dL — ABNORMAL HIGH (ref 70–99)
Glucose-Capillary: 189 mg/dL — ABNORMAL HIGH (ref 70–99)
Glucose-Capillary: 191 mg/dL — ABNORMAL HIGH (ref 70–99)

## 2022-04-24 LAB — BASIC METABOLIC PANEL
Anion gap: 9 (ref 5–15)
BUN: 23 mg/dL (ref 8–23)
CO2: 24 mmol/L (ref 22–32)
Calcium: 9.2 mg/dL (ref 8.9–10.3)
Chloride: 106 mmol/L (ref 98–111)
Creatinine, Ser: 0.83 mg/dL (ref 0.61–1.24)
GFR, Estimated: >60 mL/min (ref >60)
Glucose, Bld: 184 mg/dL — ABNORMAL HIGH (ref 70–99)
Potassium: 4.2 mmol/L (ref 3.5–5.1)
Sodium: 139 mmol/L (ref 135–145)

## 2022-04-24 LAB — CBC
HCT: 28.1 % — ABNORMAL LOW (ref 39.0–52.0)
Hemoglobin: 9.3 g/dL — ABNORMAL LOW (ref 13.0–17.0)
MCH: 34.3 pg — ABNORMAL HIGH (ref 26.0–34.0)
MCHC: 33.1 g/dL (ref 30.0–36.0)
MCV: 103.7 fL — ABNORMAL HIGH (ref 80.0–100.0)
Platelets: 295 10*3/uL (ref 150–400)
RBC: 2.71 MIL/uL — ABNORMAL LOW (ref 4.22–5.81)
RDW: 13.4 % (ref 11.5–15.5)
WBC: 16.4 10*3/uL — ABNORMAL HIGH (ref 4.0–10.5)
nRBC: 1.1 % — ABNORMAL HIGH (ref 0.0–0.2)

## 2022-04-24 LAB — TRIGLYCERIDES: Triglycerides: 326 mg/dL — ABNORMAL HIGH (ref <150)

## 2022-04-24 MED ORDER — FLEET ENEMA 7-19 GM/118ML RE ENEM
1.0000 | ENEMA | Freq: Once | RECTAL | Status: AC
  Administered 2022-04-24: 1 via RECTAL
  Filled 2022-04-24: qty 1

## 2022-04-24 MED ORDER — INSULIN ASPART 100 UNIT/ML IJ SOLN
0.0000 [IU] | INTRAMUSCULAR | Status: DC
  Administered 2022-04-24 – 2022-04-25 (×7): 3 [IU] via SUBCUTANEOUS
  Administered 2022-04-25: 5 [IU] via SUBCUTANEOUS
  Administered 2022-04-25 – 2022-04-26 (×3): 3 [IU] via SUBCUTANEOUS

## 2022-04-24 MED ORDER — TRAVASOL 10 % IV SOLN
INTRAVENOUS | Status: AC
  Filled 2022-04-24: qty 1299.6

## 2022-04-24 NOTE — Progress Notes (Signed)
Notified nurse to change PICC line drsg today. Drsg not intact. Tomasita Morrow, RN VAST

## 2022-04-24 NOTE — TOC Progression Note (Signed)
Transition of Care [facility]) - Progression Note    Patient Details  Name: Cody [name] MRN: [id number] Date of Birth: 05/04/1961  Transition of Care [facility]) CM/SW Contact  Delilah Shan, LCSWA Phone Number: 04/24/2022, 11:39 AM  Clinical Narrative:     Plan to return to [facility] when medically ready for dc. CSW will continue to follow and assist with patients dc planning needs.  Expected Discharge Plan: Skilled Nursing Facility Barriers to Discharge: Continued Medical Work up  Expected Discharge Plan and Services Expected Discharge Plan: Skilled Nursing Facility In-house Referral: Clinical Social Work     Living arrangements for the past 2 months: Skilled Nursing Facility                                       Social Determinants of Health (SDOH) Interventions    Readmission Risk Interventions     No data to display

## 2022-04-24 NOTE — Progress Notes (Signed)
7 Days Post-Op  Subjective: CC: Has had another BM which he says was close to normal for him NGT still high output (>1K). States he is passing flatus but also still belching. Denies nausea/emesis  Objective: Vital signs in last 24 hours: Temp:  [97.9 F (36.6 C)-98.7 F (37.1 C)] 98.7 F (37.1 C) (09/12 0725) Pulse Rate:  [100-109] 101 (09/12 0725) Resp:  [19-20] 20 (09/12 0725) BP: (114-137)/(76-83) 114/76 (09/12 0725) SpO2:  [94 %-97 %] 97 % (09/12 0725) Weight:  [99.7 kg] 99.7 kg (09/12 0419) Last BM Date : 04/23/22  Intake/Output from previous day: 09/11 0701 - 09/12 0700 In: 2138.2 [I.V.:2138.2] Out: 2970 [Urine:1700; Emesis/NG output:1270] Intake/Output this shift: No intake/output data recorded.  PE: Gen:  Alert, NAD Abd: At least moderate distension but still soft, appropriately tender around incision only. Hypoactive bowel sounds. Midline wound with staples in place. Some dried crusting over the inferior aspect of the wound but no active drainage. NGT with feculent appearing with output in cannister.   Lab Results:  Recent Labs    04/23/22 0401 04/24/22 0415  WBC 16.1* 16.4*  HGB 9.2* 9.3*  HCT 28.7* 28.1*  PLT 316 295    BMET Recent Labs    04/23/22 0401 04/24/22 0415  NA 139 139  K 4.2 4.2  CL 105 106  CO2 25 24  GLUCOSE 200* 184*  BUN 19 23  CREATININE 0.84 0.83  CALCIUM 9.1 9.2    PT/INR No results for input(s): "LABPROT", "INR" in the last 72 hours. CMP     Component Value Date/Time   NA 139 04/24/2022 0415   NA 142 11/15/2017 1646   K 4.2 04/24/2022 0415   CL 106 04/24/2022 0415   CO2 24 04/24/2022 0415   GLUCOSE 184 (H) 04/24/2022 0415   BUN 23 04/24/2022 0415   BUN 15 11/15/2017 1646   CREATININE 0.83 04/24/2022 0415   CREATININE 1.14 09/27/2016 1532   CALCIUM 9.2 04/24/2022 0415   PROT 6.5 04/23/2022 0401   PROT 7.3 10/07/2017 0954   ALBUMIN 2.7 (L) 04/23/2022 0401   ALBUMIN 4.4 10/07/2017 0954   AST 51 (H) 04/23/2022  0401   ALT 105 (H) 04/23/2022 0401   ALKPHOS 106 04/23/2022 0401   BILITOT 0.8 04/23/2022 0401   BILITOT 0.6 10/07/2017 0954   GFRNONAA >60 04/24/2022 0415   GFRNONAA 71 09/27/2016 1532   GFRAA >60 01/18/2018 0526   GFRAA 82 09/27/2016 1532   Lipase  No results found for: "LIPASE"  Studies/Results: VAS [place] LOWER EXTREMITY VENOUS (DVT)  Result Date: 04/23/2022  Lower Venous DVT Study Patient Name:  Cody [name] [facility]  Date of Exam:   04/22/2022 Medical Rec #: [id number]       Accession #:    [id number] Date of Birth: Jan 20, 1961       Patient Gender: M Patient Age:   61 years Exam Location:  [facility] Procedure:      VAS [place] LOWER EXTREMITY VENOUS (DVT) Referring Phys: Hazeline Junker --------------------------------------------------------------------------------  Indications: Edema, and elevated d-dimer.  Comparison Study: No prior study Performing Technologist: Sherren Kerns RVS  Examination Guidelines: A complete evaluation includes B-mode imaging, spectral Doppler, color Doppler, and power Doppler as needed of all accessible portions of each vessel. Bilateral testing is considered an integral part of a complete examination. Limited examinations for reoccurring indications may be performed as noted. The reflux portion of the exam is performed with the patient in reverse Trendelenburg.  +---------+---------------+---------+-----------+----------+--------------+ RIGHT  CompressibilityPhasicitySpontaneityPropertiesThrombus Aging +---------+---------------+---------+-----------+----------+--------------+ CFV      Full           Yes      Yes                                 +---------+---------------+---------+-----------+----------+--------------+ SFJ      Full                                                        +---------+---------------+---------+-----------+----------+--------------+ FV Prox  Full                                                         +---------+---------------+---------+-----------+----------+--------------+ FV Mid   Full                                                        +---------+---------------+---------+-----------+----------+--------------+ FV DistalFull                                                        +---------+---------------+---------+-----------+----------+--------------+ PFV      Full                                                        +---------+---------------+---------+-----------+----------+--------------+ POP      Full           Yes      Yes                                 +---------+---------------+---------+-----------+----------+--------------+ PTV      Full                                                        +---------+---------------+---------+-----------+----------+--------------+ PERO     Full                                                        +---------+---------------+---------+-----------+----------+--------------+   +---------+---------------+---------+-----------+----------+--------------+ LEFT     CompressibilityPhasicitySpontaneityPropertiesThrombus Aging +---------+---------------+---------+-----------+----------+--------------+ CFV      Full           Yes      Yes                                 +---------+---------------+---------+-----------+----------+--------------+  SFJ      Full                                                        +---------+---------------+---------+-----------+----------+--------------+ FV Prox  Full                                                        +---------+---------------+---------+-----------+----------+--------------+ FV Mid   Full                                                        +---------+---------------+---------+-----------+----------+--------------+ FV DistalFull                                                         +---------+---------------+---------+-----------+----------+--------------+ PFV      Full                                                        +---------+---------------+---------+-----------+----------+--------------+ POP      Full           Yes      Yes                                 +---------+---------------+---------+-----------+----------+--------------+ PTV      Full                                                        +---------+---------------+---------+-----------+----------+--------------+ PERO     Full                                                        +---------+---------------+---------+-----------+----------+--------------+     Summary: RIGHT: - There is no evidence of deep vein thrombosis in the lower extremity.  - No cystic structure found in the popliteal fossa.  LEFT: - There is no evidence of deep vein thrombosis in the lower extremity.  - No cystic structure found in the popliteal fossa.  *See table(s) above for measurements and observations. Electronically signed by Sherald Hess MD on 04/23/2022 at 1:47:33 PM.    Final     Anti-infectives: Anti-infectives (From admission, onward)    Start     Dose/Rate Route Frequency Ordered Stop   04/17/22 1100  ceFAZolin (ANCEF) IVPB 2g/100 mL premix  2 g 200 mL/hr over 30 Minutes Intravenous On call to O.R. 04/17/22 1045 04/17/22 1150   04/09/22 1100  Ampicillin-Sulbactam (UNASYN) 3 g in sodium chloride 0.9 % 100 mL IVPB  Status:  Discontinued        3 g 200 mL/hr over 30 Minutes Intravenous Every 8 hours 04/09/22 1045 04/14/22 1419        Assessment/Plan POD 8 s/p Exploratory laparotomy, lysis of adhesions for SBO by Dr. Luisa Hart, 04/17/22 - Patient did have bm past 2 days but still appears quite distended with hypoactive bowel sounds and NGT w/ high output. Keep to [facility] today and will get abd xray to evaluate further. - continue abdominal binder - afebrile with some tachycardia this am (low  100s), WBC stable at 16.4 - Cont TPN - Bx Mesentery of SB reassuring with benign dense fibrovascular stroma compatible with an adhesion - Mobilize for bowel function, PT. Okay with NGT clamped for mobilization. Currently recommending SNF. Plan is to return to [facility] at d/c.  - Pulm toilet   FEN - NPO, NGT, TPN, IVF per TRH. Enema today VTE - SCDs; Lovenox (hgb stable) ID - None currently. Afebrile. Monitor wbc.   - Per TRH -  New dx DM (DKA on presentation) AKI - resolved PNA - completed abx   LOS: 15 days    Eric Form , [facility] 04/24/2022, 8:58 AM Please see Amion for pager number during day hours 7:00am-4:30pm

## 2022-04-24 NOTE — Progress Notes (Signed)
PHARMACY - TOTAL PARENTERAL NUTRITION CONSULT NOTE   Indication: Small bowel obstruction/prolonged ileus  Patient Measurements: Height: 5\' 11"  (180.3 cm) Weight: 99.7 kg (219 lb 12.8 oz) IBW/kg (Calculated) : 75.3 TPN AdjBW (KG): 82.3 Body mass index is 30.66 kg/m. Usual Weight: 100-103 kg  Assessment:  61 yo M with newly diagnosed type 2 diabetes. Patient developed ileus 9/1 which then evolved into SBO now s/p ex lap 04/17/22. Pt was eating ~50% of dysphagia/carb diet from 8/28-9/1. NPO since 04/14/22. Anticipate prolonged ileus - pharmacy consulted for TPN.   9/12 Update: Had another BM, + flatus, and experienced belching. Continues to have high NG tube output. Abd X-ray ordered to evaluate.  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 Cl:Ac 1:1 Add standard MVI and trace elements to TPN Continue moderate SSI Q6H and adjust as needed Continue Insulin regular 50 units in TPN  Daily TG while elevated Monitor TPN labs Mon/Thurs, and as indicated F/u abdominal Xray results  Thank you for involving pharmacy in this patient's care.  06/18/22, PharmD Clinical Pharmacist 04/24/2022 10:01 AM

## 2022-04-24 NOTE — Progress Notes (Signed)
Progress Note  Patient: Cody [name] QIH:[id number] DOB: 1961/04/22  DOA: 04/09/2022  DOS: 04/24/2022    Brief hospital course: Cody [name] is a 61 y.o. male with a history of GERD, allergic rhinitis who presented 8/28 from [facility] with confusion found to be in DKA (new diagnosis of diabetes). Neuroimaging showed no stroke. the patient was also hypoxic, diagnosed with aspiration pneumonia and improved on antibiotics. DKA, AKI, encephalopathy all resolved.   He developed abdominal pain and distention, vomiting with SBO shown on CT. Surgery was consulted, performed ex lap with LOA by Dr. Luisa Hart on 9/5 with biopsy suggestive of adhesion. TPN has been started, exam and imaging consistent with persistent ileus.   Assessment and Plan: SBO: s/p ex lap, LOA by Dr. Luisa Hart 9/5. Biopsy reassuring with benign dense fibrovascular stroma suggestive of adhesion.  - NGT output still >1L and appears feculent, though pt reports flatus and had BM 2 days ago. Remains distended. XR ordered per surgery.   - Continue TPN (started 9/6) thru PICC placed 9/6.  - OOB!   T2DM newly diagnosed presenting with DKA, hyperglycemia:: A1c 11.7%. C-peptide low at 0.6, consider recheck/stim testing outside scope of DKA/acute illness.  - Continue SSI-sensitive scale, make q4h.  - Further adjustment as appropriate. - Education ongoing.   Acute hypoxic respiratory failure: Initially related to aspiration pneumonia and sepsis. ?if now also due to atelectasis. I/O as documented show net negative, though weights up 98kg > 108kg. TTE with normal LVEF and G1 DD. - Weight down 108 > 103 > 99kg. ?incompletely charted UOP of 1.3L/24hrs. Cr stable. - Improving down to 1-2L with lasix, will repeat today. CTA showed no PE after d-dimer elevated. There were opacities suggestive of infiltrate vs. edema.   - Continue to strongly encourage OOB, incentive spirometry!  Hyperlipidemia: LDL 151   - Restart statin once taking po.    Acute kidney injury: In the setting of DKA.  Resolved. - Avoid nephrotoxins. BUN:Cr widening with diuresis, will continue monitoring.  Acute metabolic encephalopathy/strokelike symptoms: CVA work-up unrevealing.  EEG unremarkable. Resolved.   Severe sepsis with acute hypoxic respiratory failure due to aspiration pneumonia: POA. Sepsis physiology resolved, PCT low and trended downward, has completed unasyn x5 days.  - Monitor off further abx. Leukocytosis stable, suspect this could be reactive to ongoing SBO.  LFT elevation: Monitor later this week.   Acute blood loss anemia: Hgb dropped 15g/dl on admission >> 8.7F/IE. Seems to be stabilizing. Anemia panel without deficiency. Urine is dark, but without hgb or RBCs on analysis. No melena/hematochezia. ?if related to surgery.  - nRBC showing expected marrow response. Hgb stable in 9's. - Continue monitoring. Ok to continue lovenox 0.5mg /kg q24h for VTE ppx (negative LE venous U/S and CTA chest).     Sinus tachycardia: Improved. - IV metoprolol to 5 mg as needed HR > 120.   GERD: - Continue PPI   Mild hypernatremia/hypercalcemia: Likely from dehydration.  Resolved.   Hypokalemia: Resolved.   Tobacco use disorder - Encourage cessation - Continue nicotine patch   Generalized weakness/physical deconditioning - PT/OT recommended return to SNF for further therapy   Leukocytosis/bandemia: Likely demargination. Stable. Afebrile. - Continue monitoring  Obesity: Estimated body mass index is 30.66 kg/m as calculated from the following:   Height as of this encounter: 5\' 11"  (1.803 m).   Weight as of this encounter: 99.7 kg.  Subjective: Denies N/V or abd pain. Having flatus. Wants to eat/drink. Denies dyspnea, chest pain, bleeding.  Objective:  Vitals:   04/23/22 1737 04/23/22 2021 04/24/22 0419 04/24/22 0725  BP: 136/81 118/77 114/80 114/76  Pulse: 100 (!) 107 (!) 107 (!) 101  Resp: 20   20  Temp: 98 F (36.7 C) 98.5 F (36.9 C)  98.7 F (37.1 C) 98.7 F (37.1 C)  TempSrc: Oral Oral Oral Oral  SpO2: 95% 94% 94% 97%  Weight:   99.7 kg   Height:       Gen: 61 y.o. male in no distress Pulm: Nonlabored breathing 1L O2. Slight crackles bibasilar, no wheezes. CV: Regular borderline tachycardia without murmur, rub, or gallop. No JVD, trace LE dependent edema. GI: Abdomen distended, tight, not focally tender, hypoactive bowel sounds, midline incision with dried serous discharge, scant, no exudate or bleeding. Staples intact, skin edges well-apposed.   Ext: Warm, no deformities Skin: No other/new rashes, lesions or ulcers on visualized skin. Neuro: Alert and oriented. No focal neurological deficits. Psych: Judgement and insight appear fair. Mood euthymic & affect congruent. Behavior is appropriate.    Data Personally reviewed: CBC: Recent Labs  Lab 04/20/22 0500 04/21/22 0500 04/22/22 0515 04/23/22 0401 04/24/22 0415  WBC 12.7* 16.9* 16.0* 16.1* 16.4*  HGB 8.9* 9.3* 8.8* 9.2* 9.3*  HCT 26.5* 27.4* 26.5* 28.7* 28.1*  MCV 103.1* 101.9* 104.3* 106.3* 103.7*  PLT 309 318 312 316 295   Basic Metabolic Panel: Recent Labs  Lab 04/19/22 0045 04/20/22 0500 04/21/22 0500 04/22/22 0515 04/23/22 0401 04/24/22 0415  NA 136 139 138 140 139 139  K 3.5 3.8 4.1 4.0 4.2 4.2  CL 104 107 107 105 105 106  CO2 25 25 22 26 25 24   GLUCOSE 160* 153* 185* 169* 200* 184*  BUN 15 16 18 17 19 23   CREATININE 1.03 0.82 0.78 0.83 0.84 0.83  CALCIUM 8.0* 8.4* 8.6* 8.8* 9.1 9.2  MG 2.2 2.3 2.4 2.4 2.4  --   PHOS 2.2* 3.1 3.4 3.6 3.7  --    GFR: Estimated Creatinine Clearance: 111.1 mL/min (by C-G formula based on SCr of 0.83 mg/dL).  Liver Function Tests: Recent Labs  Lab 04/18/22 0456 04/19/22 0045 04/23/22 0401  AST 29 27 51*  ALT 35 36 105*  ALKPHOS 50 47 106  BILITOT 2.0* 1.4* 0.8  PROT 5.5* 5.2* 6.5  ALBUMIN 2.5* 2.2* 2.7*   Family Communication: None at bedside  Disposition: Status is: Inpatient Remains  inpatient appropriate because: Ongoing SBO/ileus. Planned Discharge Destination:  Return to [facility], MD 04/24/2022 9:56 AM Page by [email or web address]

## 2022-04-25 DIAGNOSIS — G9341 Metabolic encephalopathy: Secondary | ICD-10-CM | POA: Diagnosis not present

## 2022-04-25 DIAGNOSIS — E1310 Other specified diabetes mellitus with ketoacidosis without coma: Secondary | ICD-10-CM | POA: Diagnosis not present

## 2022-04-25 DIAGNOSIS — D62 Acute posthemorrhagic anemia: Secondary | ICD-10-CM | POA: Diagnosis not present

## 2022-04-25 DIAGNOSIS — N179 Acute kidney failure, unspecified: Secondary | ICD-10-CM | POA: Diagnosis not present

## 2022-04-25 LAB — GLUCOSE, CAPILLARY
Glucose-Capillary: 164 mg/dL — ABNORMAL HIGH (ref 70–99)
Glucose-Capillary: 170 mg/dL — ABNORMAL HIGH (ref 70–99)
Glucose-Capillary: 178 mg/dL — ABNORMAL HIGH (ref 70–99)
Glucose-Capillary: 193 mg/dL — ABNORMAL HIGH (ref 70–99)
Glucose-Capillary: 194 mg/dL — ABNORMAL HIGH (ref 70–99)
Glucose-Capillary: 214 mg/dL — ABNORMAL HIGH (ref 70–99)

## 2022-04-25 LAB — TRIGLYCERIDES: Triglycerides: 314 mg/dL — ABNORMAL HIGH (ref <150)

## 2022-04-25 LAB — BASIC METABOLIC PANEL
Anion gap: 6 (ref 5–15)
BUN: 25 mg/dL — ABNORMAL HIGH (ref 8–23)
CO2: 26 mmol/L (ref 22–32)
Calcium: 9.2 mg/dL (ref 8.9–10.3)
Chloride: 109 mmol/L (ref 98–111)
Creatinine, Ser: 0.83 mg/dL (ref 0.61–1.24)
GFR, Estimated: >60 mL/min (ref >60)
Glucose, Bld: 166 mg/dL — ABNORMAL HIGH (ref 70–99)
Potassium: 3.9 mmol/L (ref 3.5–5.1)
Sodium: 141 mmol/L (ref 135–145)

## 2022-04-25 MED ORDER — ALUM & MAG HYDROXIDE-SIMETH 200-200-20 MG/5ML PO SUSP
30.0000 mL | Freq: Four times a day (QID) | ORAL | Status: DC | PRN
  Administered 2022-04-25 – 2022-04-29 (×3): 30 mL via ORAL
  Filled 2022-04-25 (×4): qty 30

## 2022-04-25 MED ORDER — BISACODYL 10 MG RE SUPP
10.0000 mg | Freq: Once | RECTAL | Status: AC
  Administered 2022-04-25: 10 mg via RECTAL
  Filled 2022-04-25: qty 1

## 2022-04-25 MED ORDER — TRAVASOL 10 % IV SOLN
INTRAVENOUS | Status: AC
  Filled 2022-04-25: qty 1299.6

## 2022-04-25 MED ORDER — PHENOL 1.4 % MT LIQD: 1.0000 | OROMUCOSAL | Status: DC | PRN

## 2022-04-25 NOTE — Progress Notes (Signed)
8 Days Post-Op   Subjective/Chief Complaint: PT states no BM but passing flatus Abd distended   Objective: Vital signs in last 24 hours: Temp:  [98 F (36.7 C)-99 F (37.2 C)] 98.7 F (37.1 C) (09/13 0805) Pulse Rate:  [100-112] 110 (09/13 0805) Resp:  [18-20] 18 (09/13 0805) BP: (106-128)/(70-86) 121/85 (09/13 0805) SpO2:  [91 %-100 %] 100 % (09/13 0805) Weight:  [98.7 kg] 98.7 kg (09/13 0427) Last BM Date : 04/23/22  Intake/Output from previous day: 09/12 0701 - 09/13 0700 In: 1524.1 [I.V.:1524.1] Out: 2600 [Urine:1500; Emesis/NG output:1100] Intake/Output this shift: No intake/output data recorded. PE:  Constitutional: No acute distress, conversant, appears states age. Eyes: Anicteric sclerae, moist conjunctiva, no lid lag Lungs: Clear to auscultation bilaterally, normal respiratory effort CV: regular rate and rhythm, no murmurs, no peripheral edema, pedal pulses 2+ GI: Distended, no masses or hepatosplenomegaly, non-tender to palpation Skin: No rashes, palpation reveals normal turgor Psychiatric: appropriate judgment and insight, oriented to person, place, and time   Lab Results:  Recent Labs    04/23/22 0401 04/24/22 0415  WBC 16.1* 16.4*  HGB 9.2* 9.3*  HCT 28.7* 28.1*  PLT 316 295   BMET Recent Labs    04/24/22 0415 04/25/22 0500  NA 139 141  K 4.2 3.9  CL 106 109  CO2 24 26  GLUCOSE 184* 166*  BUN 23 25*  CREATININE 0.83 0.83  CALCIUM 9.2 9.2   PT/INR No results for input(s): "LABPROT", "INR" in the last 72 hours. ABG No results for input(s): "PHART", "HCO3" in the last 72 hours.  Invalid input(s): "PCO2", "PO2"  Studies/Results: DG Abd Portable 1V  Result Date: 04/24/2022 CLINICAL DATA:  Ileus following GI surgery EXAM: PORTABLE ABDOMEN - 1 VIEW COMPARISON:  04/21/2022 FINDINGS: NG tube in the mid body of the stomach. Gas in nondilated large and small bowel loops similar to the prior study. Skin staples in the midline. IMPRESSION: NG  tube body the stomach.  Mild ileus unchanged. Electronically Signed   By: Marlan Palau M.D.   On: 04/24/2022 11:47    Anti-infectives: Anti-infectives (From admission, onward)    Start     Dose/Rate Route Frequency Ordered Stop   04/17/22 1100  ceFAZolin (ANCEF) IVPB 2g/100 mL premix        2 g 200 mL/hr over 30 Minutes Intravenous On call to O.R. 04/17/22 1045 04/17/22 1150   04/09/22 1100  Ampicillin-Sulbactam (UNASYN) 3 g in sodium chloride 0.9 % 100 mL IVPB  Status:  Discontinued        3 g 200 mL/hr over 30 Minutes Intravenous Every 8 hours 04/09/22 1045 04/14/22 1419       Assessment/Plan: POD 9 s/p Exploratory laparotomy, lysis of adhesions for SBO by Dr. Luisa Hart, 04/17/22 - Patient did have bm past 2 days but still appears quite distended with hypoactive bowel sounds and NGT w/ high output. Keep to [facility] today and will get abd xray to evaluate further. - continue abdominal binder - afebrile with some tachycardia this am (low 100s), WBC stable at 16.4 - Cont TPN - Bx Mesentery of SB reassuring with benign dense fibrovascular stroma compatible with an adhesion - Mobilize for bowel function, PT. Okay with NGT clamped for mobilization. Currently recommending SNF. Plan is to return to [facility] at d/c.  - Pulm toilet   FEN - NPO, NGT, TPN, IVF per TRH. Supp today VTE - SCDs; Lovenox (hgb stable) ID - None currently. Afebrile. Monitor wbc.   - Per  TRH -  New dx DM (DKA on presentation) AKI - resolved PNA - completed abx  LOS: 16 days    Axel Filler 04/25/2022

## 2022-04-25 NOTE — Progress Notes (Signed)
Progress Note  Patient: Cody [name] RKY:[id number] DOB: May 25, 1961  DOA: 04/09/2022  DOS: 04/25/2022    Brief hospital course: DENSON NICCOLI is a 61 y.o. male with a history of GERD, allergic rhinitis who presented 8/28 from [facility] SNF with confusion found to be in DKA (new diagnosis of diabetes). Neuroimaging showed no stroke. the patient was also hypoxic, diagnosed with aspiration pneumonia and improved on antibiotics. DKA, AKI, encephalopathy all resolved.   He developed abdominal pain and distention, vomiting with SBO shown on CT. Surgery was consulted, performed ex lap with LOA by Dr. Luisa Hart on 9/5 with biopsy suggestive of adhesion. TPN has been started, exam and imaging consistent with persistent ileus.   Assessment and Plan: SBO: s/p ex lap, LOA by Dr. Luisa Hart 9/5. Biopsy reassuring with benign dense fibrovascular stroma suggestive of adhesion.  - NGT output still >1L. +flatus, no BM yesterday or today. Remains distended. XR stable, suppository ordered per surgery. - Can use chloraseptic spray for sore throat prn - Continue TPN (started 9/6) thru PICC placed 9/6.  - OOB!   T2DM newly diagnosed presenting with DKA, hyperglycemia:: A1c 11.7%. C-peptide low at 0.6, consider recheck/stim testing outside scope of DKA/acute illness.  - Continue SSI q4h, augmented to moderate scale, will monitor for need for further titration. - Further adjustment as appropriate. - Education ongoing.   Acute hypoxic respiratory failure: Initially related to aspiration pneumonia and sepsis. ?if now also due to atelectasis. I/O as documented show net negative, though weights up 98kg > 108kg. TTE with normal LVEF and G1 DD. - Weight down 108 > 103 > 99 > 98kg. 1L NG output yesterday, 1.5L UOP/24hrs.  - Improving down to 1-2L with lasix, will plan to repeat until Cr bumps or weaned from oxygen. CTA showed no PE after d-dimer elevated. There were opacities suggestive of infiltrate vs. edema.   - Continue  to strongly encourage OOB, incentive spirometry!  Hyperlipidemia: LDL 151   - Restart statin once taking po.   Acute kidney injury: In the setting of DKA.  Resolved. - Avoid nephrotoxins. BUN:Cr continues widening with diuresis, will continue monitoring.  Acute metabolic encephalopathy/strokelike symptoms: CVA work-up unrevealing.  EEG unremarkable. Resolved.   Severe sepsis with acute hypoxic respiratory failure due to aspiration pneumonia: POA. Sepsis physiology resolved, PCT low and trended downward, has completed unasyn x5 days.  - Monitor off further abx. Leukocytosis stable, suspect this could be reactive to ongoing SBO.  LFT elevation: Monitor later this week.   Acute blood loss anemia: Hgb dropped 15g/dl on admission >> 3.1D/VV. Seems to be stabilizing. Anemia panel without deficiency. Urine is dark, but without hgb or RBCs on analysis. No melena/hematochezia. ?if related to surgery.  - nRBC showing expected marrow response. Hgb stable in 9's. - Continue lovenox 0.5mg /kg q24h for VTE ppx (negative LE venous U/S and CTA chest).     Sinus tachycardia: Improved. - IV metoprolol to 5 mg as needed HR > 120.   GERD: - Continue PPI   Mild hypernatremia/hypercalcemia: Likely from dehydration.  Resolved.   Hypokalemia: Resolved. Will continue monitoring with IV diuresis   Tobacco use disorder - Encourage cessation - Continue nicotine patch   Generalized weakness/physical deconditioning - PT/OT recommended return to SNF for further therapy   Leukocytosis/bandemia: Likely demargination. Stable. Afebrile. - Continue monitoring  Obesity: Estimated body mass index is 30.36 kg/m as calculated from the following:   Height as of this encounter: 5\' 11"  (1.803 m).   Weight as of  this encounter: 98.7 kg.  Subjective: "soreness" noted and points to lower mid abdomen which is unchanged, near lower incision border. This has not appreciably changed. 1L NG output yesterday, 1.5L UOP/24hrs.    Objective: Vitals:   04/25/22 0406 04/25/22 0427 04/25/22 0805 04/25/22 1144  BP: 125/84  121/85 124/80  Pulse: (!) 104  (!) 110 (!) 109  Resp:   18 19  Temp: 98.3 F (36.8 C)  98.7 F (37.1 C) 98.6 F (37 C)  TempSrc: Oral  Oral Oral  SpO2: 95%  100% 98%  Weight:  98.7 kg    Height:       Gen: 61 y.o. male in no distress Pulm: Nonlabored breathing supplemental oxygen at 1-2LPM, improved crackles at bases, no wheezes. CV: Regular tachycardia. No murmur, rub, or gallop. No JVD, minimal dependent edema. GI: Abdomen distended with hypoactive bowel sounds. Midline incision appears unchanged without exudate, bleeding, or significant erythema. Well-apposed wound edges with staples.  Ext: Warm, no deformities Skin: No other rashes, lesions or ulcers on visualized skin. Neuro: Alert and oriented. No focal neurological deficits. Psych: Judgement and insight appear fair. Mood euthymic & affect congruent. Behavior is appropriate.    Data Personally reviewed: CBC: Recent Labs  Lab 04/20/22 0500 04/21/22 0500 04/22/22 0515 04/23/22 0401 04/24/22 0415  WBC 12.7* 16.9* 16.0* 16.1* 16.4*  HGB 8.9* 9.3* 8.8* 9.2* 9.3*  HCT 26.5* 27.4* 26.5* 28.7* 28.1*  MCV 103.1* 101.9* 104.3* 106.3* 103.7*  PLT 309 318 312 316 295   Basic Metabolic Panel: Recent Labs  Lab 04/19/22 0045 04/20/22 0500 04/21/22 0500 04/22/22 0515 04/23/22 0401 04/24/22 0415 04/25/22 0500  NA 136 139 138 140 139 139 141  K 3.5 3.8 4.1 4.0 4.2 4.2 3.9  CL 104 107 107 105 105 106 109  CO2 25 25 22 26 25 24 26   GLUCOSE 160* 153* 185* 169* 200* 184* 166*  BUN 15 16 18 17 19 23  25*  CREATININE 1.03 0.82 0.78 0.83 0.84 0.83 0.83  CALCIUM 8.0* 8.4* 8.6* 8.8* 9.1 9.2 9.2  MG 2.2 2.3 2.4 2.4 2.4  --   --   PHOS 2.2* 3.1 3.4 3.6 3.7  --   --    GFR: Estimated Creatinine Clearance: 110.6 mL/min (by C-G formula based on SCr of 0.83 mg/dL).  Liver Function Tests: Recent Labs  Lab 04/19/22 0045 04/23/22 0401   AST 27 51*  ALT 36 105*  ALKPHOS 47 106  BILITOT 1.4* 0.8  PROT 5.2* 6.5  ALBUMIN 2.2* 2.7*   Family Communication: None at bedside  Disposition: Status is: Inpatient Remains inpatient appropriate because: Ongoing SBO/ileus. Planned Discharge Destination:  Return to [facility], MD 04/25/2022 2:30 PM Page by [email or web address]

## 2022-04-25 NOTE — TOC Progression Note (Signed)
Transition of Care [facility]) - Progression Note    Patient Details  Name: Cody [name] MRN: [id number] Date of Birth: [birth year]  [facility] CM/SW Contact  Delilah Shan, LCSWA Phone Number: 04/25/2022, 1:34 PM  Clinical Narrative:     Plan to return to [facility] when medically ready for dc. CSW will continue to follow and assist with patients dc planning needs.  Expected Discharge Plan: Skilled Nursing Facility Barriers to Discharge: Continued Medical Work up  Expected Discharge Plan and Services Expected Discharge Plan: Skilled Nursing Facility In-house Referral: Clinical Social Work     Living arrangements for the past 2 months: Skilled Nursing Facility                                       Social Determinants of Health (SDOH) Interventions    Readmission Risk Interventions     No data to display

## 2022-04-25 NOTE — Progress Notes (Signed)
PHARMACY - TOTAL PARENTERAL NUTRITION CONSULT NOTE   Indication: Small bowel obstruction/prolonged ileus  Patient Measurements: Height: 5\' 11"  (180.3 cm) Weight: 98.7 kg (217 lb 11.2 oz) IBW/kg (Calculated) : 75.3 TPN AdjBW (KG): 82.3 Body mass index is 30.36 kg/m. Usual Weight: 100-103 kg  Assessment:  60 yo M with newly diagnosed type 2 diabetes. Patient developed ileus 9/1 which then evolved into SBO now s/p ex lap 04/17/22. Pt was eating ~50% of dysphagia/carb diet from 8/28-9/1. NPO since 04/14/22. Anticipate prolonged ileus - pharmacy consulted for TPN.   Glucose / Insulin: A1c 11.7%, BG 170-220; 15u SSI in 24h  Electrolytes: K 3.9; CoCa 10.2, other WNL Renal: Scr <1 ; BUN 25  Hepatic: AST 51, ALT 105 (increased); Trigs 314 (increased); Albumin 2.7 Intake / Output; MIVF: NG output 06/14/22, UOP 0.6 mL/kg/hr (Lasix 40 IV daily), LBM 9/12 - passing flatus  GI Imaging: 9/3 CT AP: c/f partial or developing SBO  9/4 Ab Xray: c/f distal obstruction or ileus 9/8 Ab Xray: multiple dilated SB compatible with post-op ileus 9/12 Ab Xray: Mild ileus unchanged GI Surgeries / Procedures:  9/5 Ex lap, lysis of adhesions (no abscess, no diverticulitis)  9/5 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 Cl:Ac 1:1 Add standard MVI and trace elements to TPN Continue moderate SSI Q4H and adjust as  needed Increase Insulin regular to 55 units in TPN  Daily TG while elevated Monitor TPN labs Mon/Thurs, and as indicated  Thank you for involving pharmacy in this patient's care.  06/18/22, PharmD Clinical Pharmacist 04/25/2022 7:29 AM

## 2022-04-25 NOTE — Progress Notes (Signed)
PT Cancellation Note  Patient Details Name: Cody [name] MRN: [id number] DOB: [birth year]   Cancelled Treatment:    Reason Eval/Treat Not Completed: Patient declined, no reason specified. Pt declines PT twice on this date, reporting abdominal discomfort. PT provides max encouragement for mobilization in an effort to improve bowel function and reduce discomfort, however the pt continues to refuse mobilizing at this time. PT will attempt to follow up as time allows.   Arlyss Gandy 04/25/2022, 4:07 PM

## 2022-04-26 DIAGNOSIS — N179 Acute kidney failure, unspecified: Secondary | ICD-10-CM | POA: Diagnosis not present

## 2022-04-26 DIAGNOSIS — E1310 Other specified diabetes mellitus with ketoacidosis without coma: Secondary | ICD-10-CM | POA: Diagnosis not present

## 2022-04-26 DIAGNOSIS — G9341 Metabolic encephalopathy: Secondary | ICD-10-CM | POA: Diagnosis not present

## 2022-04-26 DIAGNOSIS — D62 Acute posthemorrhagic anemia: Secondary | ICD-10-CM | POA: Diagnosis not present

## 2022-04-26 LAB — CBC
HCT: 30.4 % — ABNORMAL LOW (ref 39.0–52.0)
Hemoglobin: 10.2 g/dL — ABNORMAL LOW (ref 13.0–17.0)
MCH: 35.3 pg — ABNORMAL HIGH (ref 26.0–34.0)
MCHC: 33.6 g/dL (ref 30.0–36.0)
MCV: 105.2 fL — ABNORMAL HIGH (ref 80.0–100.0)
Platelets: 313 10*3/uL (ref 150–400)
RBC: 2.89 MIL/uL — ABNORMAL LOW (ref 4.22–5.81)
RDW: 13.8 % (ref 11.5–15.5)
WBC: 16.4 10*3/uL — ABNORMAL HIGH (ref 4.0–10.5)
nRBC: 0.6 % — ABNORMAL HIGH (ref 0.0–0.2)

## 2022-04-26 LAB — COMPREHENSIVE METABOLIC PANEL
ALT: 164 U/L — ABNORMAL HIGH (ref 0–44)
AST: 81 U/L — ABNORMAL HIGH (ref 15–41)
Albumin: 3.1 g/dL — ABNORMAL LOW (ref 3.5–5.0)
Alkaline Phosphatase: 130 U/L — ABNORMAL HIGH (ref 38–126)
Anion gap: 13 (ref 5–15)
BUN: 27 mg/dL — ABNORMAL HIGH (ref 8–23)
CO2: 26 mmol/L (ref 22–32)
Calcium: 9.5 mg/dL (ref 8.9–10.3)
Chloride: 106 mmol/L (ref 98–111)
Creatinine, Ser: 0.78 mg/dL (ref 0.61–1.24)
GFR, Estimated: >60 mL/min (ref >60)
Glucose, Bld: 179 mg/dL — ABNORMAL HIGH (ref 70–99)
Potassium: 4.1 mmol/L (ref 3.5–5.1)
Sodium: 145 mmol/L (ref 135–145)
Total Bilirubin: 0.8 mg/dL (ref 0.3–1.2)
Total Protein: 7.3 g/dL (ref 6.5–8.1)

## 2022-04-26 LAB — GLUCOSE, CAPILLARY
Glucose-Capillary: 166 mg/dL — ABNORMAL HIGH (ref 70–99)
Glucose-Capillary: 178 mg/dL — ABNORMAL HIGH (ref 70–99)
Glucose-Capillary: 187 mg/dL — ABNORMAL HIGH (ref 70–99)
Glucose-Capillary: 191 mg/dL — ABNORMAL HIGH (ref 70–99)
Glucose-Capillary: 195 mg/dL — ABNORMAL HIGH (ref 70–99)

## 2022-04-26 LAB — TRIGLYCERIDES: Triglycerides: 328 mg/dL — ABNORMAL HIGH (ref <150)

## 2022-04-26 LAB — MAGNESIUM: Magnesium: 2.5 mg/dL — ABNORMAL HIGH (ref 1.7–2.4)

## 2022-04-26 LAB — PHOSPHORUS: Phosphorus: 4.1 mg/dL (ref 2.5–4.6)

## 2022-04-26 MED ORDER — TRAVASOL 10 % IV SOLN
INTRAVENOUS | Status: AC
  Filled 2022-04-26: qty 1296

## 2022-04-26 MED ORDER — INSULIN ASPART 100 UNIT/ML IJ SOLN
0.0000 [IU] | Freq: Four times a day (QID) | INTRAMUSCULAR | Status: DC
  Administered 2022-04-26 (×2): 4 [IU] via SUBCUTANEOUS
  Administered 2022-04-27: 3 [IU] via SUBCUTANEOUS
  Administered 2022-04-27 (×3): 4 [IU] via SUBCUTANEOUS
  Administered 2022-04-28: 3 [IU] via SUBCUTANEOUS

## 2022-04-26 MED ORDER — ENOXAPARIN SODIUM 60 MG/0.6ML IJ SOSY
50.0000 mg | PREFILLED_SYRINGE | INTRAMUSCULAR | Status: DC
  Administered 2022-04-26 – 2022-05-05 (×10): 50 mg via SUBCUTANEOUS
  Filled 2022-04-26 (×10): qty 0.6

## 2022-04-26 NOTE — Progress Notes (Signed)
Physical Therapy Treatment Patient Details Name: Cody [name] MRN: [id number] DOB: [birth year] Today's Date: 04/26/2022   History of Present Illness 61 y.o. male presents to [facility] hospital on 04/09/2022 with AMS, found to be in DKA with newly diagnosed type 1 diabetes. Pt developed severe sepsis 2/2 with acute hypoxic respiratory failure 2/2 aspiration PNA, as well as ileus during admission. Pt underwent ex-lap with lysis of adhesions on 04/17/2022. PMH includes GERD, OA.    PT Comments    Pt received supine and agreeable to session with continued progress towards acute goals. Pt needing min assist for bed mobility and transfers to standing with RW for cueing, trunk assist and hand placement throughout. Pt demonstrating increased ambulation tolerance with min guard and RW, cues needed throughout for posture and proximity to RW. Pt agreeable to time up in recliner at end of session and receptive to education re; importance of continued mobility benefits as well as benefits of upright sitting. Pt continues to benefit from skilled PT services to progress toward functional mobility goals.    Recommendations for follow up therapy are one component of a multi-disciplinary discharge planning process, led by the attending physician.  Recommendations may be updated based on patient status, additional functional criteria and insurance authorization.  Follow Up Recommendations  Skilled nursing-short term rehab (<3 hours/day) Can patient physically be transported by private vehicle: No   Assistance Recommended at Discharge Frequent or constant Supervision/Assistance  Patient can return home with the following A lot of help with bathing/dressing/bathroom;Help with stairs or ramp for entrance;Assist for transportation;Assistance with cooking/housework;A little help with walking and/or transfers   Equipment Recommendations  None recommended by PT    Recommendations for Other Services       Precautions /  Restrictions Precautions Precautions: Fall Precaution Comments: NG tube Restrictions Weight Bearing Restrictions: No     Mobility  Bed Mobility Overal bed mobility: Needs Assistance Bed Mobility: Rolling, Sidelying to Sit Rolling: Min assist Sidelying to sit: Min assist       General bed mobility comments: Assist to elevated trunk in to sitting and bring hips to EOB. Verbal cues for technique    Transfers Overall transfer level: Needs assistance Equipment used: Rolling walker (2 wheels) Transfers: Sit to/from Stand Sit to Stand: Min assist, From elevated surface           General transfer comment: Assist to bring hips up    Ambulation/Gait Ambulation/Gait assistance: Min guard Gait Distance (Feet): 90 Feet Assistive device: Rolling walker (2 wheels) Gait Pattern/deviations: Decreased step length - right, Decreased step length - left, Shuffle, Trunk flexed, Step-through pattern Gait velocity: decr     General Gait Details: Assist for safety and lines. Verbal cues to stay closer to walker and stand more erect, pt able to self pace distance   Stairs             Wheelchair Mobility    Modified Rankin (Stroke Patients Only)       Balance Overall balance assessment: Needs assistance Sitting-balance support: No upper extremity supported, Feet supported Sitting balance-Leahy Scale: Fair     Standing balance support: Bilateral upper extremity supported, Reliant on assistive device for balance Standing balance-Leahy Scale: Poor Standing balance comment: walker and min guard for static standing                            Cognition Arousal/Alertness: Awake/alert Behavior During Therapy: Flat affect Overall Cognitive Status: Impaired/Different from  baseline Area of Impairment: Problem solving                             Problem Solving: Slow processing, Requires verbal cues          Exercises General Exercises - Lower  Extremity Long Arc Quad: AROM, Right, Left, 10 reps, Seated Hip Flexion/Marching: AROM, Right, Left, 20 reps, Seated    General Comments General comments (skin integrity, edema, etc.): VSS on RA      Pertinent Vitals/Pain Pain Assessment Pain Assessment: Faces Faces Pain Scale: No hurt Pain Intervention(s): Monitored during session    Home Living                          Prior Function            PT Goals (current goals can now be found in the care plan section) Acute Rehab PT Goals Patient Stated Goal: to reduce pain PT Goal Formulation: With patient Time For Goal Achievement: 05/02/22    Frequency    Min 3X/week      PT Plan Current plan remains appropriate    Co-evaluation              AM-PAC PT "6 Clicks" Mobility   Outcome Measure  Help needed turning from your back to your side while in a flat bed without using bedrails?: A Little Help needed moving from lying on your back to sitting on the side of a flat bed without using bedrails?: A Little Help needed moving to and from a bed to a chair (including a wheelchair)?: A Little Help needed standing up from a chair using your arms (e.g., wheelchair or bedside chair)?: A Lot (for cues) Help needed to walk in hospital room?: A Little Help needed climbing 3-5 steps with a railing? : Total 6 Click Score: 15    End of Session Equipment Utilized During Treatment: Gait belt Activity Tolerance: Patient tolerated treatment well Patient left: in chair;with call bell/phone within reach Nurse Communication: Mobility status PT Visit Diagnosis: Unsteadiness on feet (R26.81);Muscle weakness (generalized) (M62.81)     Time: 5573-2202 PT Time Calculation (min) (ACUTE ONLY): 30 min  Charges:  $Gait Training: 8-22 mins $Therapeutic Exercise: 8-22 mins                     Maricel Swartzendruber R. PTA Acute Rehabilitation Services Office: [phone]    Catalina Antigua 04/26/2022, 2:18 PM

## 2022-04-26 NOTE — Progress Notes (Signed)
PHARMACY - TOTAL PARENTERAL NUTRITION CONSULT NOTE   Indication: Small bowel obstruction/prolonged ileus  Patient Measurements: Height: 5\' 11"  (180.3 cm) Weight: 101.1 kg (222 lb 14.2 oz) IBW/kg (Calculated) : 75.3 TPN AdjBW (KG): 82.3 Body mass index is 31.09 kg/m. Usual Weight: 100-103 kg  Assessment:  60 yo M with newly diagnosed type 2 diabetes. Patient developed ileus 9/1 which then evolved into SBO now s/p ex lap 04/17/22. Pt was eating ~50% of dysphagia/carb diet from 8/28-9/1. NPO since 04/14/22. Anticipate prolonged ileus - pharmacy consulted for TPN.   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  Central access: PICC 04/17/22 TPN start date: 04/18/22  Nutritional Goals: Goal TPN rate is 90 mL/hr (provides 129 g of protein, 73g lipid, 296g dextrose, and 2257 kcals per day)  RD Assessment: Estimated Needs Total Energy Estimated Needs: 2200-2400 Total Protein Estimated Needs: 110-125 grams Total Fluid Estimated Needs: >/= 2 L  Current Nutrition:  NPO and TPN  Plan:  Slightly decrease fluid for goal TPN at 90 mL/hr at 1800 provides 130g protein, 2202 kcal, providing 100% of needs Electrolytes in TPN: Decrease Na 75 mEq/L, Ca 0 mEq/L, Mg 4 mEq/L, and Phos 8 mmol/L; Continue K 40 mEq/L, Cl:Ac 1:1 Add standard MVI and trace elements to TPN Change to  resistant SSI Q6 hr and adjust as needed Increase insulin regular to 65 units in TPN  F/u Daily TG while elevated Monitor TPN labs Mon/Thurs, and as indicated  Thank you for involving pharmacy in this patient's care. 06/18/22, PharmD, BCPS, BCCP Clinical Pharmacist  Please check AMION for all [facility] Pharmacy phone numbers After 10:00 PM, call Main Pharmacy [phone]

## 2022-04-26 NOTE — Progress Notes (Signed)
  Mobility Specialist Criteria Algorithm Info.    04/26/22 1054  Mobility  Activity Refused mobility   Patient declined for unspecified reasons despite max encouragement. Will f/u as time permits  [place] Osiah Haring, CMS, BS EXP Acute Rehabilitation Services  Phone:[phone] Office: [phone]

## 2022-04-26 NOTE — Progress Notes (Signed)
Pt tolerated clamping of NG tube and clear liquids. Marth PA stated it was okay to remove the NG tube. Tube was removed approx 3:30pm. Will CTM

## 2022-04-26 NOTE — Progress Notes (Signed)
Progress Note  Patient: Cody [name] DOB: [birth year]  DOA: 04/09/2022  DOS: 04/26/2022    Brief hospital course: Cody [name] is a 61 y.o. male with a history of GERD, allergic rhinitis who presented 8/28 from [facility] SNF with confusion found to be in DKA (new diagnosis of diabetes). Neuroimaging showed no stroke. the patient was also hypoxic, diagnosed with aspiration pneumonia and improved on antibiotics. DKA, AKI, encephalopathy all resolved.   He developed abdominal pain and distention, vomiting with SBO shown on CT. Surgery was consulted, performed ex lap with LOA by Dr. Luisa Hart on 9/5 with biopsy suggestive of adhesion. TPN has been started, exam and imaging consistent with persistent ileus.   Assessment and Plan: SBO: s/p ex lap, LOA by Dr. Luisa Hart 9/5. Biopsy reassuring with benign dense fibrovascular stroma suggestive of adhesion.  - NGT output still >1L. +BM reported by pt, surgery starting clamping trial and allowing clears. - Can use chloraseptic spray for sore throat prn - Continue TPN (started 9/6) thru PICC placed 9/6.  - OOB!   T2DM newly diagnosed presenting with DKA, hyperglycemia:: A1c 11.7%.  - C-peptide low at 0.6, consider recheck/stim testing outside scope of DKA/acute illness.  - Continue SSI q4h, augmented to moderate scale, will monitor for need for further titration. - Further adjustment as appropriate. - Education ongoing.  Acute hypoxic respiratory failure: Initially related to aspiration pneumonia and sepsis. ?if now also due to atelectasis. I/O as documented show net negative, though weights up 98kg > 108kg. TTE with normal LVEF and G1 DD. - Weight down overall, continues negative balance with stable creatinine.  - Improving down to 1-2L with lasix, will plan to repeat until Cr bumps or weaned from oxygen. CTA showed no PE after d-dimer elevated. There were opacities suggestive of infiltrate vs. edema.   - Continue to strongly encourage  OOB, incentive spirometry!  Hyperlipidemia: LDL 151   - Restart statin once taking po.   Acute kidney injury: In the setting of DKA.  Resolved. - Avoid nephrotoxins. BUN:Cr continues widening with diuresis, will continue monitoring.  Acute metabolic encephalopathy/strokelike symptoms: CVA work-up unrevealing.  EEG unremarkable. Resolved.   Severe sepsis with acute hypoxic respiratory failure due to aspiration pneumonia: POA. Sepsis physiology resolved, PCT low and trended downward, has completed unasyn x5 days.  - Monitor off further abx. Leukocytosis stable, suspect this could be reactive to ongoing SBO.  LFT elevation: Monitor later this week.   Acute blood loss anemia: Hgb dropped 15g/dl on admission >> 2.3N/TI. Seems to be stabilizing. Anemia panel without deficiency. Urine is dark, but without hgb or RBCs on analysis. No melena/hematochezia. ?if related to surgery.  - nRBC showing expected marrow response. Hgb rising - Continue lovenox 0.5mg /kg q24h for VTE ppx (negative LE venous U/S and CTA chest).     Sinus tachycardia: Improved. No fevers, no PE on CTA. - IV metoprolol to 5 mg as needed HR > 120.   GERD: - Continue PPI   Mild hypernatremia/hypercalcemia: Likely from dehydration.  Resolved.   Hypokalemia: Resolved. Will continue monitoring with IV diuresis   Tobacco use disorder - Encourage cessation - Continue nicotine patch   Generalized weakness/physical deconditioning - PT/OT recommended return to SNF for further therapy   Leukocytosis/bandemia: Likely demargination. Stable. Afebrile. - Continue monitoring  Obesity: Estimated body mass index is 31.09 kg/m as calculated from the following:   Height as of this encounter: 5\' 11"  (1.803 m).   Weight as of this encounter: 101.1 kg.  Subjective: No new complaints, denies shortness of breath, cough, chest pain. Reports continued flatus, less belching, and stable lower abdominal soreness. +BM reported to surgery.    Objective: Vitals:   04/25/22 2100 04/26/22 0019 04/26/22 0405 04/26/22 1343  BP: 114/83 129/86 123/73 117/82  Pulse: (!) 106 (!) 110 (!) 106 (!) 113  Resp: 18 18 18 17   Temp: 98.4 F (36.9 C) 97.6 F (36.4 C) 98.7 F (37.1 C) 98 F (36.7 C)  TempSrc: Oral Oral Oral Oral  SpO2: 96% 96% 95% 98%  Weight:   101.1 kg   Height:       Gen: 61 y.o. male in no distress Pulm: Nonlabored breathing room air. Clear. CV: Regular mild tachycardia without murmur, rub, or gallop. No JVD, no significant dependent edema. GI: Abdomen remains distended with healing midline incision, mild appropriate incisional tenderness, not elsewhere tender. Ext: Warm, no deformities Skin: No new rashes, lesions or ulcers on visualized skin. Neuro: Alert and oriented. No focal neurological deficits. Psych: Judgement and insight appear fair. Mood euthymic & affect congruent. Behavior is appropriate.    Data Personally reviewed: CBC: Recent Labs  Lab 04/21/22 0500 04/22/22 0515 04/23/22 0401 04/24/22 0415 04/26/22 0531  WBC 16.9* 16.0* 16.1* 16.4* 16.4*  HGB 9.3* 8.8* 9.2* 9.3* 10.2*  HCT 27.4* 26.5* 28.7* 28.1* 30.4*  MCV 101.9* 104.3* 106.3* 103.7* 105.2*  PLT 318 312 316 295 313   Basic Metabolic Panel: Recent Labs  Lab 04/20/22 0500 04/21/22 0500 04/22/22 0515 04/23/22 0401 04/24/22 0415 04/25/22 0500 04/26/22 0531  NA 139 138 140 139 139 141 145  K 3.8 4.1 4.0 4.2 4.2 3.9 4.1  CL 107 107 105 105 106 109 106  CO2 25 22 26 25 24 26 26   GLUCOSE 153* 185* 169* 200* 184* 166* 179*  BUN 16 18 17 19 23  25* 27*  CREATININE 0.82 0.78 0.83 0.84 0.83 0.83 0.78  CALCIUM 8.4* 8.6* 8.8* 9.1 9.2 9.2 9.5  MG 2.3 2.4 2.4 2.4  --   --  2.5*  PHOS 3.1 3.4 3.6 3.7  --   --  4.1   GFR: Estimated Creatinine Clearance: 115.9 mL/min (by C-G formula based on SCr of 0.78 mg/dL).  Liver Function Tests: Recent Labs  Lab 04/23/22 0401 04/26/22 0531  AST 51* 81*  ALT 105* 164*  ALKPHOS 106 130*   BILITOT 0.8 0.8  PROT 6.5 7.3  ALBUMIN 2.7* 3.1*   Family Communication: None at bedside  Disposition: Status is: Inpatient Remains inpatient appropriate because: Ongoing SBO/ileus. Planned Discharge Destination:  Return to [facility], MD 04/26/2022 3:54 PM Page by [email or web address]

## 2022-04-26 NOTE — Progress Notes (Signed)
9 Days Post-Op   Subjective/Chief Complaint: He states he has had another BM and no nausea. Passing flatus and belching decreased. Abdominal distension is stable   Objective: Vital signs in last 24 hours: Temp:  [97.6 F (36.4 C)-98.7 F (37.1 C)] 98.7 F (37.1 C) (09/14 0405) Pulse Rate:  [106-111] 106 (09/14 0405) Resp:  [18-20] 18 (09/14 0405) BP: (114-133)/(73-87) 123/73 (09/14 0405) SpO2:  [95 %-98 %] 95 % (09/14 0405) Weight:  [101.1 kg] 101.1 kg (09/14 0405) Last BM Date : 04/25/22  Intake/Output from previous day: 09/13 0701 - 09/14 0700 In: -  Out: 2750 [Urine:2050; Emesis/NG output:700] Intake/Output this shift: No intake/output data recorded. PE:  Constitutional: No acute distress, conversant, appears stated age. Eyes: Anicteric sclerae, moist conjunctiva, no lid lag CV: regular rate and rhythm, no murmurs, no peripheral edema, pedal pulses 2+ GI: Distended, non-tender to palpation. Incisions with staples and small amount of dried serous drainage distal wound. Abd binder in place. NGT with thin bilious output Skin: No rashes, palpation reveals normal turgor Psychiatric: appropriate judgment and insight, oriented to person, place, and time   Lab Results:  Recent Labs    04/24/22 0415 04/26/22 0531  WBC 16.4* 16.4*  HGB 9.3* 10.2*  HCT 28.1* 30.4*  PLT 295 313    BMET Recent Labs    04/25/22 0500 04/26/22 0531  NA 141 145  K 3.9 4.1  CL 109 106  CO2 26 26  GLUCOSE 166* 179*  BUN 25* 27*  CREATININE 0.83 0.78  CALCIUM 9.2 9.5    PT/INR No results for input(s): "LABPROT", "INR" in the last 72 hours. ABG No results for input(s): "PHART", "HCO3" in the last 72 hours.  Invalid input(s): "PCO2", "PO2"  Studies/Results: DG Abd Portable 1V  Result Date: 04/24/2022 CLINICAL DATA:  Ileus following GI surgery EXAM: PORTABLE ABDOMEN - 1 VIEW COMPARISON:  04/21/2022 FINDINGS: NG tube in the mid body of the stomach. Gas in nondilated large and small  bowel loops similar to the prior study. Skin staples in the midline. IMPRESSION: NG tube body the stomach.  Mild ileus unchanged. Electronically Signed   By: Marlan Palau M.D.   On: 04/24/2022 11:47    Anti-infectives: Anti-infectives (From admission, onward)    Start     Dose/Rate Route Frequency Ordered Stop   04/17/22 1100  ceFAZolin (ANCEF) IVPB 2g/100 mL premix        2 g 200 mL/hr over 30 Minutes Intravenous On call to O.R. 04/17/22 1045 04/17/22 1150   04/09/22 1100  Ampicillin-Sulbactam (UNASYN) 3 g in sodium chloride 0.9 % 100 mL IVPB  Status:  Discontinued        3 g 200 mL/hr over 30 Minutes Intravenous Every 8 hours 04/09/22 1045 04/14/22 1419       Assessment/Plan: POD 9 s/p Exploratory laparotomy, lysis of adhesions for SBO by Dr. Luisa Hart, 04/17/22 - NGT output down to 700 ml - having bowel function, clamp and clears today - continue abdominal binder - afebrile with some tachycardia this am (low 100s), WBC stable at 16.4 - Cont TPN - Bx Mesentery of SB reassuring with benign dense fibrovascular stroma compatible with an adhesion - Mobilize for bowel function, PT. Okay with NGT clamped for mobilization. Currently recommending SNF. Plan is to return to [facility] at d/c.  - Pulm toilet   FEN - clamp NGT. CLD, TPN, IVF per TRH. Supp today VTE - SCDs; Lovenox (hgb stable) ID - None currently. Afebrile. Monitor wbc.   -  Per TRH -  New dx DM (DKA on presentation) AKI - resolved PNA - completed abx  LOS: 17 days   Eric Form, [facility] Surgery 04/26/2022, 10:00 AM Please see Amion for pager number during day hours 7:00am-4:30pm

## 2022-04-26 NOTE — TOC Progression Note (Signed)
Transition of Care [facility]) - Progression Note    Patient Details  Name: Cody [name] MRN: [id number] Date of Birth: 05-01-1961  [facility]) CM/SW Contact  Delilah Shan, LCSWA Phone Number: 04/26/2022, 1:48 PM  Clinical Narrative:     Plan to return to [facility] when medically ready for dc. CSW will continue to follow and assist with patients dc planning needs.  Expected Discharge Plan: Skilled Nursing Facility Barriers to Discharge: Continued Medical Work up  Expected Discharge Plan and Services Expected Discharge Plan: Skilled Nursing Facility In-house Referral: Clinical Social Work     Living arrangements for the past 2 months: Skilled Nursing Facility                                       Social Determinants of Health (SDOH) Interventions    Readmission Risk Interventions     No data to display

## 2022-04-27 ENCOUNTER — Inpatient Hospital Stay (HOSPITAL_COMMUNITY): Payer: Medicaid Other

## 2022-04-27 DIAGNOSIS — E1165 Type 2 diabetes mellitus with hyperglycemia: Secondary | ICD-10-CM | POA: Diagnosis not present

## 2022-04-27 DIAGNOSIS — K56609 Unspecified intestinal obstruction, unspecified as to partial versus complete obstruction: Secondary | ICD-10-CM | POA: Diagnosis not present

## 2022-04-27 LAB — BASIC METABOLIC PANEL
Anion gap: 10 (ref 5–15)
BUN: 27 mg/dL — ABNORMAL HIGH (ref 8–23)
CO2: 25 mmol/L (ref 22–32)
Calcium: 9.2 mg/dL (ref 8.9–10.3)
Chloride: 106 mmol/L (ref 98–111)
Creatinine, Ser: 0.82 mg/dL (ref 0.61–1.24)
GFR, Estimated: >60 mL/min (ref >60)
Glucose, Bld: 164 mg/dL — ABNORMAL HIGH (ref 70–99)
Potassium: 3.9 mmol/L (ref 3.5–5.1)
Sodium: 141 mmol/L (ref 135–145)

## 2022-04-27 LAB — CBC
HCT: 31.5 % — ABNORMAL LOW (ref 39.0–52.0)
Hemoglobin: 10.5 g/dL — ABNORMAL LOW (ref 13.0–17.0)
MCH: 34.8 pg — ABNORMAL HIGH (ref 26.0–34.0)
MCHC: 33.3 g/dL (ref 30.0–36.0)
MCV: 104.3 fL — ABNORMAL HIGH (ref 80.0–100.0)
Platelets: 334 10*3/uL (ref 150–400)
RBC: 3.02 MIL/uL — ABNORMAL LOW (ref 4.22–5.81)
RDW: 13.9 % (ref 11.5–15.5)
WBC: 15.3 10*3/uL — ABNORMAL HIGH (ref 4.0–10.5)
nRBC: 0.5 % — ABNORMAL HIGH (ref 0.0–0.2)

## 2022-04-27 LAB — TRIGLYCERIDES: Triglycerides: 337 mg/dL — ABNORMAL HIGH (ref <150)

## 2022-04-27 LAB — PHOSPHORUS: Phosphorus: 3.6 mg/dL (ref 2.5–4.6)

## 2022-04-27 LAB — MAGNESIUM: Magnesium: 2.4 mg/dL (ref 1.7–2.4)

## 2022-04-27 LAB — GLUCOSE, CAPILLARY
Glucose-Capillary: 128 mg/dL — ABNORMAL HIGH (ref 70–99)
Glucose-Capillary: 154 mg/dL — ABNORMAL HIGH (ref 70–99)
Glucose-Capillary: 155 mg/dL — ABNORMAL HIGH (ref 70–99)
Glucose-Capillary: 190 mg/dL — ABNORMAL HIGH (ref 70–99)

## 2022-04-27 MED ORDER — OXYCODONE HCL 5 MG PO TABS
5.0000 mg | ORAL_TABLET | ORAL | Status: DC | PRN
  Administered 2022-04-28: 10 mg via ORAL
  Filled 2022-04-27: qty 2

## 2022-04-27 MED ORDER — ACETAMINOPHEN 325 MG PO TABS: 650.0000 mg | ORAL_TABLET | Freq: Four times a day (QID) | ORAL | Status: DC | PRN

## 2022-04-27 MED ORDER — TRAVASOL 10 % IV SOLN
INTRAVENOUS | Status: DC
  Filled 2022-04-27 (×2): qty 1296

## 2022-04-27 MED ORDER — IOHEXOL 350 MG/ML SOLN
75.0000 mL | Freq: Once | INTRAVENOUS | Status: AC | PRN
  Administered 2022-04-27: 75 mL via INTRAVENOUS

## 2022-04-27 MED ORDER — ENSURE ENLIVE PO LIQD
237.0000 mL | Freq: Two times a day (BID) | ORAL | Status: DC
  Administered 2022-04-28 – 2022-04-29 (×2): 237 mL via ORAL

## 2022-04-27 NOTE — Progress Notes (Signed)
Nutrition Follow-up  DOCUMENTATION CODES:   Not applicable  INTERVENTION:   Ensure Enlive po BID, each supplement provides 350 kcal and 20 grams of protein. Encourage good PO intake   NUTRITION DIAGNOSIS:   Increased nutrient needs related to post-op healing as evidenced by estimated needs. - Ongoing  GOAL:   Patient will meet greater than or equal to 90% of their needs - Being addressed via TPN  MONITOR:   Labs, I & O's, Diet advancement, Weight trends  REASON FOR ASSESSMENT:   Consult New TPN/TNA  ASSESSMENT:   61 y.o. male presented to the ED with AMS from assisted living facility. PMH includes GERD. Pt admitted with DKA (new diagnosed DM) and AKI. Later developed an ileus vs SBO, requiring surgery.   9/03 - NGT placed to LIWS 9/05 - Ex lap s/p lysis of adhesions; NGT replaced to LIWS 9/06 - TPN started  9/14 - diet advanced to clear liquids; NGT removed 9/15 - diet advanced to full liquids   Pt reports that he is eating well. Denies any nausea or vomiting. States that he has not had a bowel movement but is passing gas. Per chart, pt with a bowel movement today.   RD received a message from pharmacist that TPN compounder broke and unable to continue TPN tonight. Plan to monitor PO intake.   Medications reviewed and include: Doculax, Lasix, NovoLog, Protonix Labs reviewed: 24 hr CBG 166-209  NGT output: 700 mL x 24 hours  Diet Order:   Diet Order             Diet full liquid Room service appropriate? Yes; Fluid consistency: Thin  Diet effective now                   EDUCATION NEEDS:   No education needs have been identified at this time  Skin:  Skin Assessment: Skin Integrity Issues: Skin Integrity Issues:: Incisions Incisions: Abdomen  Last BM:  9/10 - Type 7  Height:   Ht Readings from Last 1 Encounters:  04/09/22 5\' 11"  (1.803 m)    Weight:   Wt Readings from Last 1 Encounters:  04/27/22 98.6 kg   BMI:  Body mass index is 30.32  kg/m.  Estimated Nutritional Needs:  Kcal:  2200-2400 Protein:  110-125 grams Fluid:  >/= 2 L   04/29/22 RD, LDN Clinical Dietitian See [facility] for contact information.

## 2022-04-27 NOTE — TOC Progression Note (Signed)
Transition of Care [facility]) - Progression Note    Patient Details  Name: Cody [name] MRN: [id number] Date of Birth: 1961-02-14  Transition of Care [facility]) CM/SW Contact  Delilah Shan, LCSWA Phone Number: 04/27/2022, 11:08 AM  Clinical Narrative:     Plan to return to [facility] when medically ready for dc. CSW will continue to follow and assist with patients dc planning needs  Expected Discharge Plan: Skilled Nursing Facility Barriers to Discharge: Continued Medical Work up  Expected Discharge Plan and Services Expected Discharge Plan: Skilled Nursing Facility In-house Referral: Clinical Social Work     Living arrangements for the past 2 months: Skilled Nursing Facility                                       Social Determinants of Health (SDOH) Interventions    Readmission Risk Interventions     No data to display

## 2022-04-27 NOTE — Progress Notes (Addendum)
10 Days Post-Op   Subjective/Chief Complaint: Tolerated CLD yesterday and NGT removed. Tells me he had another bowel movement and no n/v. Denies abdominal pain   Objective: Vital signs in last 24 hours: Temp:  [98 F (36.7 C)-98.5 F (36.9 C)] 98.5 F (36.9 C) (09/15 0330) Pulse Rate:  [105-113] 106 (09/15 0330) Resp:  [17-18] 18 (09/15 0330) BP: (117-138)/(82-86) 125/86 (09/15 0330) SpO2:  [96 %-98 %] 96 % (09/15 0330) Weight:  [98.6 kg] 98.6 kg (09/15 0330) Last BM Date : 04/25/22  Intake/Output from previous day: 09/14 0701 - 09/15 0700 In: 1076.3 [P.O.:120; I.V.:956.3] Out: 400 [Urine:400] Intake/Output this shift: No intake/output data recorded. PE:  Constitutional: No acute distress, conversant, appears stated age. Eyes: Anicteric sclerae, moist conjunctiva, no lid lag CV: regular rate and rhythm, no murmurs, no peripheral edema, pedal pulses 2+ GI: Distended, non-tender to palpation. Incisions with staples and small amount of dried serous discharge distal wound. Abd binder in place. Skin: No rashes, palpation reveals normal turgor Psychiatric: appropriate judgment and insight, oriented to person, place, and time   Lab Results:  Recent Labs    04/26/22 0531  WBC 16.4*  HGB 10.2*  HCT 30.4*  PLT 313    BMET Recent Labs    04/26/22 0531 04/27/22 0344  NA 145 141  K 4.1 3.9  CL 106 106  CO2 26 25  GLUCOSE 179* 164*  BUN 27* 27*  CREATININE 0.78 0.82  CALCIUM 9.5 9.2    PT/INR No results for input(s): "LABPROT", "INR" in the last 72 hours. ABG No results for input(s): "PHART", "HCO3" in the last 72 hours.  Invalid input(s): "PCO2", "PO2"  Studies/Results: No results found.  Anti-infectives: Anti-infectives (From admission, onward)    Start     Dose/Rate Route Frequency Ordered Stop   04/17/22 1100  ceFAZolin (ANCEF) IVPB 2g/100 mL premix        2 g 200 mL/hr over 30 Minutes Intravenous On call to O.R. 04/17/22 1045 04/17/22 1150    04/09/22 1100  Ampicillin-Sulbactam (UNASYN) 3 g in sodium chloride 0.9 % 100 mL IVPB  Status:  Discontinued        3 g 200 mL/hr over 30 Minutes Intravenous Every 8 hours 04/09/22 1045 04/14/22 1419       Assessment/Plan: POD 10 s/p Exploratory laparotomy, lysis of adhesions for SBO by Dr. Luisa Hart, 04/17/22 - NGT out yesterday and tolerated clears. Advance to fulls and may advance to soft - continue abdominal binder - afebrile with some tachycardia this am (low 100s), WBC stable at 16.4 as of 9/14 - Cont TPN - can wean to off once tolerating FLD - Bx Mesentery of SB reassuring with benign dense fibrovascular stroma compatible with an adhesion - Mobilize for bowel function, PT. Currently recommending SNF. Plan is to return to [facility] at d/c.  - Pulm toilet   Given persistently elevated WBC will get CT today  FEN - FLD, TPN, IVF per TRH. VTE - SCDs; Lovenox (hgb stable) ID - None currently. Afebrile. Monitor wbc.   - Per TRH -  New dx DM (DKA on presentation) AKI - resolved PNA - completed abx  LOS: 18 days   Eric Form, [facility] Surgery 04/27/2022, 9:39 AM Please see Amion for pager number during day hours 7:00am-4:30pm

## 2022-04-27 NOTE — Progress Notes (Signed)
Physical Therapy Treatment Patient Details Name: Cody [name] MRN: [id number] DOB: Jul 20, 1961 Today's Date: 04/27/2022   History of Present Illness 61 y.o. male presents to [facility] hospital on 04/09/2022 with AMS, found to be in DKA with newly diagnosed type 1 diabetes. Pt developed severe sepsis 2/2 with acute hypoxic respiratory failure 2/2 aspiration PNA, as well as ileus during admission. Pt underwent ex-lap with lysis of adhesions on 04/17/2022. PMH includes GERD, OA.    PT Comments    Pt continues to make slow, steady progress with mobility. Still not able to perform mobility on his own and continue to recommend SNF at DC.    Recommendations for follow up therapy are one component of a multi-disciplinary discharge planning process, led by the attending physician.  Recommendations may be updated based on patient status, additional functional criteria and insurance authorization.  Follow Up Recommendations  Skilled nursing-short term rehab (<3 hours/day) Can patient physically be transported by private vehicle: No   Assistance Recommended at Discharge Frequent or constant Supervision/Assistance  Patient can return home with the following A lot of help with bathing/dressing/bathroom;Help with stairs or ramp for entrance;Assist for transportation;Assistance with cooking/housework;A little help with walking and/or transfers   Equipment Recommendations  None recommended by PT    Recommendations for Other Services       Precautions / Restrictions Precautions Precautions: Fall Restrictions Weight Bearing Restrictions: No     Mobility  Bed Mobility Overal bed mobility: Needs Assistance Bed Mobility: Rolling, Sidelying to Sit Rolling: Min assist Sidelying to sit: Min assist       General bed mobility comments: Assist to elevated trunk in to sitting and bring hips to EOB. Verbal cues for technique    Transfers Overall transfer level: Needs assistance Equipment used: Rolling walker  (2 wheels) Transfers: Sit to/from Stand Sit to Stand: Min assist, From elevated surface           General transfer comment: Assist to bring hips up    Ambulation/Gait Ambulation/Gait assistance: Min guard Gait Distance (Feet): 190 Feet Assistive device: Rolling walker (2 wheels) Gait Pattern/deviations: Decreased step length - right, Decreased step length - left, Trunk flexed, Step-through pattern Gait velocity: decr Gait velocity interpretation: <1.31 ft/sec, indicative of household ambulator   General Gait Details: Assist for safety and lines. Verbal cues to stay closer to walker and stand more erect   Stairs             Wheelchair Mobility    Modified Rankin (Stroke Patients Only)       Balance Overall balance assessment: Needs assistance Sitting-balance support: No upper extremity supported, Feet supported Sitting balance-Leahy Scale: Fair     Standing balance support: Bilateral upper extremity supported, Reliant on assistive device for balance Standing balance-Leahy Scale: Poor Standing balance comment: walker and min guard for static standing                            Cognition Arousal/Alertness: Awake/alert Behavior During Therapy: Flat affect Overall Cognitive Status: Impaired/Different from baseline Area of Impairment: Problem solving, Memory                     Memory: Decreased short-term memory       Problem Solving: Slow processing, Requires verbal cues General Comments: Pt asking for rice krispies. Doesn't remember he is on clear liquid diet        Exercises      General Comments  General comments (skin integrity, edema, etc.): VSS on RA      Pertinent Vitals/Pain Pain Assessment Pain Assessment: Faces Faces Pain Scale: No hurt    Home Living                          Prior Function            PT Goals (current goals can now be found in the care plan section) Acute Rehab PT Goals Patient  Stated Goal: not stated Progress towards PT goals: Progressing toward goals    Frequency    Min 2X/week      PT Plan Current plan remains appropriate;Frequency needs to be updated    Co-evaluation              AM-PAC PT "6 Clicks" Mobility   Outcome Measure  Help needed turning from your back to your side while in a flat bed without using bedrails?: A Little Help needed moving from lying on your back to sitting on the side of a flat bed without using bedrails?: A Little Help needed moving to and from a bed to a chair (including a wheelchair)?: A Little Help needed standing up from a chair using your arms (e.g., wheelchair or bedside chair)?: A Little Help needed to walk in hospital room?: A Little Help needed climbing 3-5 steps with a railing? : Total 6 Click Score: 16    End of Session Equipment Utilized During Treatment: Gait belt Activity Tolerance: Patient tolerated treatment well Patient left: in chair;with call bell/phone within reach;with chair alarm set Nurse Communication: Mobility status PT Visit Diagnosis: Unsteadiness on feet (R26.81);Muscle weakness (generalized) (M62.81)     Time: 9024-0973 PT Time Calculation (min) (ACUTE ONLY): 27 min  Charges:  $Gait Training: 8-22 mins                     [facility] PT Acute Rehabilitation Services Office [phone]    Angelina Ok [facility] 04/27/2022, 11:33 AM

## 2022-04-27 NOTE — Progress Notes (Signed)
Mobility Specialist - Progress Note   04/27/22 1504  Mobility  Activity Contraindicated/medical hold   Pt was being taken off unit for procedure. Will follow up if time permits.   Garald Braver

## 2022-04-27 NOTE — Progress Notes (Signed)
[facility] PROGRESS NOTE   BRAN ALDRIDGE VQQ:[id number] DOB: Feb 14, 1961 DOA: 04/09/2022  PCP: Ladell Pier, MD  Brief History/Interval Summary:  61 y.o. male with a history of GERD, allergic rhinitis who presented 8/28 from [facility] with confusion found to be in DKA (new diagnosis of diabetes). Neuroimaging showed no stroke. the patient was also hypoxic, diagnosed with aspiration pneumonia and improved on antibiotics. DKA, AKI, encephalopathy all resolved.    He developed abdominal pain and distention, vomiting with SBO shown on CT. Surgery was consulted, performed ex lap with LOA by Dr. Brantley Stage on 9/5 with biopsy suggestive of adhesion.  Patient was started on TPN.  Consultants: General surgery.  Procedures: Exploratory laparotomy    Subjective/Interval History: Patient mentions that he feels well.  Denies any abdominal pain.  Has been passing gas.  Tolerated his liquid diet yesterday.     Assessment/Plan:  Small bowel obstruction s/p ex lap, LOA by Dr. Brantley Stage 9/5. Biopsy reassuring with benign dense fibrovascular stroma suggestive of adhesion.  Surgery is managing.  NG tube was pulled out yesterday.  Patient tolerated his clears.  Patient remains on TPN.  Mobilize.   T2DM newly diagnosed presenting with DKA, hyperglycemia A1c 11.7%. C-peptide low at 0.6, consider recheck/stim testing outside scope of DKA/acute illness.  Was on long-acting insulin.  However due to small bowel obstruction and n.p.o. status he has just been on SSI.  Looks like he has been getting insulin through TPN. Need to transition back to subcutaneous long-acting insulin once he is off of TPN.   Acute hypoxic respiratory failure Initially related to aspiration pneumonia and sepsis. ?if now also due to atelectasis.  TTE with normal LVEF and G1 DD. - Weight down overall, continues negative balance with stable creatinine.  Patient was diuresed.  Renal function is stable.  CT scan did not  show any PE.  There were opacities suggestive of infiltrate versus edema. Incentive spirometry. Remains on Lasix once daily.   Hyperlipidemia LDL 151   - Restart statin once taking po.    Acute kidney injury In the setting of DKA.  Resolved. - Avoid nephrotoxins. BUN:Cr continues widening with diuresis, will continue monitoring.   Acute metabolic encephalopathy/strokelike symptoms CVA work-up unrevealing.  EEG unremarkable. Resolved.   Severe sepsis with acute hypoxic respiratory failure due to aspiration pneumonia, POA.  Sepsis physiology resolved, PCT low and trended downward, has completed unasyn x5 days.  - Monitor off further abx. Leukocytosis stable, suspect this could be reactive to ongoing SBO.   Abnormal LFTs Mildly elevated AST and ALT noted. Recheck tomorrow.     Acute blood loss anemia Hgb 15g/dl on admission, dropped to 8.9g/dl. Seems to be stabilizing. Anemia panel without deficiency.    Sinus tachycardia Improved. No fevers, no PE on CTA. - IV metoprolol to 5 mg as needed HR > 120.   GERD: - Continue PPI   Mild hypernatremia/hypercalcemia Likely from dehydration.  Resolved.   Hypokalemia Resolved. Will continue monitoring with IV diuresis   Tobacco use disorder - Encourage cessation - Continue nicotine patch   Generalized weakness/physical deconditioning - PT/OT recommended return to SNF for further therapy   Obesity Estimated body mass index is 30.32 kg/m as calculated from the following:   Height as of this encounter: '5\' 11"'  (1.803 m).   Weight as of this encounter: 98.6 kg.  DVT Prophylaxis: Lovenox Code Status: Full code Family Communication: Discussed with patient.  No family at bedside Disposition Plan: Back to SNF  when medically improved  Status is: Inpatient Remains inpatient appropriate because: Small bowel obstruction      Medications: Scheduled:  bisacodyl  10 mg Rectal Daily   Chlorhexidine Gluconate Cloth  6 each Topical  Daily   enoxaparin (LOVENOX) injection  50 mg Subcutaneous Q24H   furosemide  40 mg Intravenous Daily   insulin aspart  0-20 Units Subcutaneous Q6H   insulin starter kit- pen needles  1 kit Other Once   pantoprazole (PROTONIX) IV  40 mg Intravenous Q24H   sodium chloride flush  10-40 mL Intracatheter Q12H   Continuous:  sodium chloride 10 mL/hr at 04/27/22 1159   TPN ADULT (ION) 90 mL/hr at 04/27/22 1159   TPN ADULT (ION)     WCB:JSEGBT chloride, acetaminophen, alum & mag hydroxide-simeth, dextrose, morphine injection, nicotine, ondansetron (ZOFRAN) IV, mouth rinse, oxyCODONE, phenol, sodium chloride flush  Antibiotics: Anti-infectives (From admission, onward)    Start     Dose/Rate Route Frequency Ordered Stop   04/17/22 1100  ceFAZolin (ANCEF) IVPB 2g/100 mL premix        2 g 200 mL/hr over 30 Minutes Intravenous On call to O.R. 04/17/22 1045 04/17/22 1150   04/09/22 1100  Ampicillin-Sulbactam (UNASYN) 3 g in sodium chloride 0.9 % 100 mL IVPB  Status:  Discontinued        3 g 200 mL/hr over 30 Minutes Intravenous Every 8 hours 04/09/22 1045 04/14/22 1419       Objective:  Vital Signs  Vitals:   04/26/22 0405 04/26/22 1343 04/26/22 2026 04/27/22 0330  BP: 123/73 117/82 138/85 125/86  Pulse: (!) 106 (!) 113 (!) 105 (!) 106  Resp: '18 17 18 18  ' Temp: 98.7 F (37.1 C) 98 F (36.7 C) 98.3 F (36.8 C) 98.5 F (36.9 C)  TempSrc: Oral Oral Oral Oral  SpO2: 95% 98% 98% 96%  Weight: 101.1 kg   98.6 kg  Height:        Intake/Output Summary (Last 24 hours) at 04/27/2022 1253 Last data filed at 04/27/2022 1159 Gross per 24 hour  Intake 2034.67 ml  Output --  Net 2034.67 ml   Filed Weights   04/25/22 0427 04/26/22 0405 04/27/22 0330  Weight: 98.7 kg 101.1 kg 98.6 kg    General appearance: Awake alert.  In no distress Resp: Clear to auscultation bilaterally.  Normal effort Cardio: S1-S2 is normal regular.  No S3-S4.  No rubs murmurs or bruit GI: Abdominal binder noted.   Incision site covered with dressing.  Bowel sounds present but sluggish. Extremities: No edema.  Full range of motion of lower extremities. Neurologic: Alert and oriented x3.  No focal neurological deficits.    Lab Results:  Data Reviewed: I have personally reviewed following labs and reports of the imaging studies  CBC: Recent Labs  Lab 04/21/22 0500 04/22/22 0515 04/23/22 0401 04/24/22 0415 04/26/22 0531  WBC 16.9* 16.0* 16.1* 16.4* 16.4*  HGB 9.3* 8.8* 9.2* 9.3* 10.2*  HCT 27.4* 26.5* 28.7* 28.1* 30.4*  MCV 101.9* 104.3* 106.3* 103.7* 105.2*  PLT 318 312 316 295 517    Basic Metabolic Panel: Recent Labs  Lab 04/21/22 0500 04/22/22 0515 04/23/22 0401 04/24/22 0415 04/25/22 0500 04/26/22 0531 04/27/22 0344  NA 138 140 139 139 141 145 141  K 4.1 4.0 4.2 4.2 3.9 4.1 3.9  CL 107 105 105 106 109 106 106  CO2 '22 26 25 24 26 26 25  ' GLUCOSE 185* 169* 200* 184* 166* 179* 164*  BUN 18 17  19 23 25* 27* 27*  CREATININE 0.78 0.83 0.84 0.83 0.83 0.78 0.82  CALCIUM 8.6* 8.8* 9.1 9.2 9.2 9.5 9.2  MG 2.4 2.4 2.4  --   --  2.5*  --   PHOS 3.4 3.6 3.7  --   --  4.1  --     GFR: Estimated Creatinine Clearance: 111.8 mL/min (by C-G formula based on SCr of 0.82 mg/dL).  Liver Function Tests: Recent Labs  Lab 04/23/22 0401 04/26/22 0531  AST 51* 81*  ALT 105* 164*  ALKPHOS 106 130*  BILITOT 0.8 0.8  PROT 6.5 7.3  ALBUMIN 2.7* 3.1*     CBG: Recent Labs  Lab 04/26/22 1820 04/26/22 2150 04/27/22 0027 04/27/22 0607 04/27/22 1143  GLUCAP 187* 178* 190* 155* 128*    Lipid Profile: Recent Labs    04/26/22 0531 04/27/22 0344  TRIG 328* 337*     Radiology Studies: No results found.     LOS: 18 days   Talik Casique [employer] on [email or web address]  04/27/2022, 12:53 PM

## 2022-04-27 NOTE — Progress Notes (Signed)
When administering subq insulin to pts left upper arm, this nurse noticed that on the floor was a large sized puddle of TPN. This nurse found tubing for TPN on the floor next to the pt. Nurse scrubbed the tubing and the port, flushed the line with 10cc of NS and reattached the TPN. Prior to taking care of pt nurse did not notice the spilling of TPN on the floor, nurse is unaware of when the tubing became disconnected. Will CTM

## 2022-04-27 NOTE — Progress Notes (Addendum)
PHARMACY - TOTAL PARENTERAL NUTRITION CONSULT NOTE   Indication: Small bowel obstruction/prolonged ileus  Patient Measurements: Height: 5\' 11"  (180.3 cm) Weight: 98.6 kg (217 lb 6 oz) IBW/kg (Calculated) : 75.3 TPN AdjBW (KG): 82.3 Body mass index is 30.32 kg/m. Usual Weight: 100-103 kg  Assessment:  61 yo M with newly diagnosed type 2 diabetes. Patient developed ileus 9/1 which then evolved into SBO now s/p ex lap 04/17/22. Pt was eating ~50% of dysphagia/carb diet from 8/28-9/1. NPO since 04/14/22. Anticipate prolonged ileus - pharmacy consulted for TPN.   Glucose / Insulin: A1c 11.7%, BG 155-191; used 16u SSI in 24h  Electrolytes:  CoCa 9.9, (Mg and Phos added 9/15 on but lab very busy, did not result before TPN order due), others WNL Renal: Scr 0.82 ; BUN 27 Hepatic: AST/ALT 81/164 (increased); Trigs 337 (stable); Albumin 3.1 Intake / Output; MIVF: UOP 400 mL charted (Lasix 40 IV daily),  LBM 9/14  GI Imaging: 9/3 CT AP: c/f partial or developing SBO  9/4 Ab Xray: c/f distal obstruction or ileus 9/8 Ab Xray: multiple dilated SB compatible with post-op ileus 9/12 Ab Xray: Mild ileus unchanged GI Surgeries / Procedures:  9/5 Ex lap, lysis of adhesions (no abscess, no diverticulitis)  9/5 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 9/14 NGT removed   Central access: PICC 04/17/22 TPN start date: 04/18/22  Nutritional Goals: Goal TPN rate is 90 mL/hr (provides 129 g of protein, 73g lipid, 296g dextrose, and 2257 kcals per day)  RD Assessment: Estimated Needs Total Energy Estimated Needs: 2200-2400 Total Protein Estimated Needs: 110-125 grams Total Fluid Estimated Needs: >/= 2 L  Current Nutrition:  TPN 9/14 CLD 9/15 FLD   Plan:  Continue goal TPN at 90 mL/hr at 1800 provides 130g protein, 2202 kcal, providing 100% of needs Electrolytes in TPN: Continue Na 75 mEq/L, K 40 mEq/L, Ca 0 mEq/L, Mg 4 mEq/L, and Phos 8 mmol/L; Cl:Ac  1:1 Add standard MVI and trace elements to TPN Continue resistant SSI Q6 hr and adjust as needed Increase insulin regular to 75 units in TPN  Monitor TPN labs Mon/Thurs, and as indicated F/u PO intake, wean TPN as able   ADDENDUM 13:45: TPN compounder broke down before could make TPN for tonight. Order discontinued. Team liberalized to FLD with possible soft diet tomorrow. Team and RD notified that there will not be a TPN bag tonight, ok to monitor without D10 infusion given patient was eating well this morning. F/u repeat CT.    Thank you for involving pharmacy in this patient's care. 10/15, PharmD, BCPS, BCCP Clinical Pharmacist  Please check AMION for all [facility] Pharmacy phone numbers After 10:00 PM, call Main Pharmacy [phone]

## 2022-04-28 DIAGNOSIS — E1165 Type 2 diabetes mellitus with hyperglycemia: Secondary | ICD-10-CM | POA: Diagnosis not present

## 2022-04-28 DIAGNOSIS — K56609 Unspecified intestinal obstruction, unspecified as to partial versus complete obstruction: Secondary | ICD-10-CM | POA: Diagnosis not present

## 2022-04-28 LAB — GLUCOSE, CAPILLARY
Glucose-Capillary: 119 mg/dL — ABNORMAL HIGH (ref 70–99)
Glucose-Capillary: 122 mg/dL — ABNORMAL HIGH (ref 70–99)
Glucose-Capillary: 123 mg/dL — ABNORMAL HIGH (ref 70–99)
Glucose-Capillary: 123 mg/dL — ABNORMAL HIGH (ref 70–99)
Glucose-Capillary: 99 mg/dL (ref 70–99)

## 2022-04-28 LAB — BASIC METABOLIC PANEL
Anion gap: 9 (ref 5–15)
BUN: 23 mg/dL (ref 8–23)
CO2: 26 mmol/L (ref 22–32)
Calcium: 9.4 mg/dL (ref 8.9–10.3)
Chloride: 103 mmol/L (ref 98–111)
Creatinine, Ser: 0.74 mg/dL (ref 0.61–1.24)
GFR, Estimated: >60 mL/min (ref >60)
Glucose, Bld: 121 mg/dL — ABNORMAL HIGH (ref 70–99)
Potassium: 4 mmol/L (ref 3.5–5.1)
Sodium: 138 mmol/L (ref 135–145)

## 2022-04-28 LAB — PHOSPHORUS: Phosphorus: 3.4 mg/dL (ref 2.5–4.6)

## 2022-04-28 LAB — MAGNESIUM: Magnesium: 2.1 mg/dL (ref 1.7–2.4)

## 2022-04-28 MED ORDER — ADULT MULTIVITAMIN W/MINERALS CH
1.0000 | ORAL_TABLET | Freq: Every day | ORAL | Status: DC
  Administered 2022-04-28: 1 via ORAL
  Filled 2022-04-28 (×2): qty 1

## 2022-04-28 MED ORDER — TRAVASOL 10 % IV SOLN
INTRAVENOUS | Status: AC
  Filled 2022-04-28: qty 1296

## 2022-04-28 MED ORDER — INSULIN ASPART 100 UNIT/ML IJ SOLN
0.0000 [IU] | Freq: Three times a day (TID) | INTRAMUSCULAR | Status: DC
  Administered 2022-04-28 (×2): 2 [IU] via SUBCUTANEOUS
  Administered 2022-04-29: 3 [IU] via SUBCUTANEOUS
  Administered 2022-04-29 – 2022-04-30 (×3): 2 [IU] via SUBCUTANEOUS

## 2022-04-28 NOTE — Progress Notes (Signed)
PHARMACY - TOTAL PARENTERAL NUTRITION CONSULT NOTE   Indication: Small bowel obstruction/prolonged ileus  Patient Measurements: Height: 5\' 11"  (180.3 cm) Weight: 101.1 kg (222 lb 14.2 oz) IBW/kg (Calculated) : 75.3 TPN AdjBW (KG): 82.3 Body mass index is 31.09 kg/m. Usual Weight: 100-103 kg  Assessment:  61 yo M with newly diagnosed type 2 diabetes. Patient developed ileus 9/1 which then evolved into SBO now s/p ex lap 04/17/22. Pt was eating ~50% of dysphagia/carb diet from 8/28-9/1. NPO since 04/14/22. Anticipate prolonged ileus - pharmacy consulted for TPN.   Glucose / Insulin: A1c 11.7%, BG 99-123; used 10 u SSI in 24h  Electrolytes:  CoCa 10.1, others WNL Renal: Scr 0.72 ; BUN wnl Hepatic: AST/ALT 81/164 (increased); Trigs 337 (stable); Albumin 3.1 Intake / Output; MIVF: UOP 0.6 mL/kg/hr (Lasix 40 IV daily),  LBM 9/14  GI Imaging: 9/3 CT AP: c/f partial or developing SBO  9/4 Ab Xray: c/f distal obstruction or ileus 9/8 Ab Xray: multiple dilated SB compatible with post-op ileus 9/12 Ab Xray: Mild ileus unchanged 9/15 CT: mildly improved ileus vs ongoing partial SBO; No intra-abdominal fluid collection GI Surgeries / Procedures:  9/5 Ex lap, lysis of adhesions (no abscess, no diverticulitis)  9/5 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 9/14 NGT removed   Central access: PICC 04/17/22 TPN start date: 04/18/22  Nutritional Goals: Goal TPN rate is 90 mL/hr (provides 129 g of protein, 73g lipid, 296g dextrose, and 2257 kcals per day)  RD Assessment: Estimated Needs Total Energy Estimated Needs: 2200-2400 Total Protein Estimated Needs: 110-125 grams Total Fluid Estimated Needs: >/= 2 L  Current Nutrition:  TPN 9/14 CLD 9/15 FLD- ate some soup, no ensure; No TPN given d/t compounder broken down  9/16 FLD- working on ensure and breakfast tray with juice, jello, coffee, and cream potato soup  Plan:  Cycle goal TPN over 18 hours  (64-127 mL/hr, 1.5-2.9 mg/kg/min), provides 130 g protein, 2202 kcal, providing 100% of needs Electrolytes in TPN: Decrease K 20 mEq/L Phos 5 mmol/L; Continue Na 75 mEq/L, Ca 0 mEq/L, Mg 4 mEq/L; Cl:Ac 1:1 Change standard MVI and trace elements to PO, remove from TPN  Decrease to moderate SSI TID AC and adjust as needed Continue insulin regular 65 units in TPN  Monitor TPN labs Mon/Thurs, and as indicated F/u PO intake, wean TPN as able   Thank you for involving pharmacy in this patient's care. Benetta Spar, PharmD, BCPS, BCCP Clinical Pharmacist  Please check AMION for all [place] phone numbers After 10:00 PM, call [facility] [phone]

## 2022-04-28 NOTE — Plan of Care (Signed)

## 2022-04-28 NOTE — Progress Notes (Addendum)
Pt is complaining about mid abdominal surgical pain 9/10, was given Oxycodone 10 mg @1251  and Morphin 4mg  @1658 , without much relief , stated pain 7/10, described as burning. Pt is also nauseated and vomited brown emesis. Zofran administered @1658 . RN made a call to [name] and made them aware about pt condition. Awaiting on call surgeon's response. Vital signs stable.

## 2022-04-28 NOTE — Progress Notes (Addendum)
[facility] PROGRESS NOTE   LANCER THURNER FKC:[id number] DOB: [birth year] DOA: 04/09/2022  PCP: Ladell Pier, MD  Brief History/Interval Summary:  61 y.o. male with a history of GERD, allergic rhinitis who presented 8/28 from [facility] SN[facility] with confusion found to be in DKA (new diagnosis of diabetes). Neuroimaging showed no stroke. the patient was also hypoxic, diagnosed with aspiration pneumonia and improved on antibiotics. DKA, AKI, encephalopathy all resolved.    He developed abdominal pain and distention, vomiting with SBO shown on CT. Surgery was consulted, performed ex lap with LOA by Dr. Brantley Stage on 9/5 with biopsy suggestive of adhesion.  Patient was started on TPN.  Consultants: General surgery.  Procedures: Exploratory laparotomy    Subjective/Interval History: Patient mentioned that he is feeling better.  Shortness of breath is resolved.  Abdominal pain has resolved.  Passing gas but still no bowel movement.  Has been ambulating.      Assessment/Plan:  Small bowel obstruction S/p ex lap, LOA by Dr. Brantley Stage 9/5. Biopsy reassuring with benign dense fibrovascular stroma suggestive of adhesion.  Surgery is managing.  NG tube was pulled out on 9/14.  Tolerated clear liquid diet.  Advanced to full liquids.   Looks like he did not get his TPN yesterday due to some technical issues.  Will defer to general surgery.   CT scan of the abdomen was repeated yesterday and did not show any abscess.  WBC remains persistently elevated.  He is afebrile however.   T2DM newly diagnosed presenting with DKA, hyperglycemia A1c 11.7%. C-peptide low at 0.6, consider recheck/stim testing outside scope of DKA/acute illness.  Was on long-acting insulin.  However due to small bowel obstruction and n.p.o. status he has just been on SSI.  And he has been getting insulin through TPN. Need to transition back to subcutaneous long-acting insulin once he is off of TPN. CBGs are reasonably  well controlled.   Acute hypoxic respiratory failure/aspiration pneumonia/sepsis present on admission Initially related to aspiration pneumonia and sepsis. ?if now also due to atelectasis.  TTE with normal LVEF and G1 DD. Weight down overall, continues negative balance with stable creatinine.  Patient was diuresed.  Renal function is stable.   CT scan did not show any PE.  There were opacities suggestive of infiltrate versus edema. Incentive spirometry. Remains on Lasix once daily.   Hyperlipidemia LDL 151.  Resume statin at discharge.   Acute kidney injury In the setting of DKA.  Resolved. Avoid nephrotoxins. BUN:Cr continues widening with diuresis, will continue monitoring.   Acute metabolic encephalopathy/strokelike symptoms CVA work-up unrevealing.  EEG unremarkable. Resolved.   Abnormal LFTs Mildly elevated AST and ALT noted. Recheck tomorrow.     Acute blood loss anemia Drop in hemoglobin is likely dilutional.  No evidence of overt blood loss.   Sinus tachycardia Improved. No fevers, no PE on CTA. IV metoprolol as needed HR > 120.   GERD: - Continue PPI   Mild hypernatremia/hypercalcemia Likely from dehydration.  Resolved.   Hypokalemia Resolved. Will continue monitoring with IV diuresis   Tobacco use disorder - Encourage cessation - Continue nicotine patch   Generalized weakness/physical deconditioning - PT/OT recommended return to SNF for further therapy   Obesity Estimated body mass index is 31.09 kg/m as calculated from the following:   Height as of this encounter: _0  (1.803 m).   Weight as of this encounter: 101.1 kg.  DVT Prophylaxis: Lovenox Code Status: Full code Family Communication: Discussed with patient.  No  family at bedside Disposition Plan: Back to SNF when medically improved  Status is: Inpatient Remains inpatient appropriate because: Small bowel obstruction      Medications: Scheduled:  bisacodyl  10 mg Rectal Daily    Chlorhexidine Gluconate Cloth  6 each Topical Daily   enoxaparin (LOVENOX) injection  50 mg Subcutaneous Q24H   feeding supplement  237 mL Oral BID BM   furosemide  40 mg Intravenous Daily   insulin aspart  0-20 Units Subcutaneous Q6H   insulin starter kit- pen needles  1 kit Other Once   pantoprazole (PROTONIX) IV  40 mg Intravenous Q24H   sodium chloride flush  10-40 mL Intracatheter Q12H   Continuous:  sodium chloride 10 mL/hr at 04/27/22 1159   KTG:YBWLSL chloride, acetaminophen, alum & mag hydroxide-simeth, dextrose, morphine injection, nicotine, ondansetron (ZOFRAN) IV, mouth rinse, oxyCODONE, phenol, sodium chloride flush  Antibiotics: Anti-infectives (From admission, onward)    Start     Dose/Rate Route Frequency Ordered Stop   04/17/22 1100  ceFAZolin (ANCEF) IVPB 2g/100 mL premix        2 g 200 mL/hr over 30 Minutes Intravenous On call to O.R. 04/17/22 1045 04/17/22 1150   04/09/22 1100  Ampicillin-Sulbactam (UNASYN) 3 g in sodium chloride 0.9 % 100 mL IVPB  Status:  Discontinued        3 g 200 mL/hr over 30 Minutes Intravenous Every 8 hours 04/09/22 1045 04/14/22 1419       Objective:  Vital Signs  Vitals:   04/27/22 1404 04/27/22 2014 04/28/22 0500 04/28/22 0509  BP: 111/87 121/84  115/76  Pulse: (!) 110 (!) 107  97  Resp: 16 18    Temp: 98.2 F (36.8 C) 98.4 F (36.9 C)  98.8 F (37.1 C)  TempSrc: Oral Oral  Oral  SpO2: 93% 94%  94%  Weight:   101.1 kg   Height:        Intake/Output Summary (Last 24 hours) at 04/28/2022 0952 Last data filed at 04/28/2022 0500 Gross per 24 hour  Intake 718.33 ml  Output 1350 ml  Net -631.67 ml    Filed Weights   04/26/22 0405 04/27/22 0330 04/28/22 0500  Weight: 101.1 kg 98.6 kg 101.1 kg    General appearance: Awake alert.  In no distress Resp: Clear to auscultation bilaterally.  Normal effort Cardio: S1-S2 is normal regular.  No S3-S4.  No rubs murmurs or bruit GI: Abdomen is soft.  Incision site noted.   Abdomen covered with binder.  Bowel sounds present but sluggish. Extremities: No edema.  Full range of motion of lower extremities. Neurologic: Alert and oriented x3.  No focal neurological deficits.     Lab Results:  Data Reviewed: I have personally reviewed following labs and reports of the imaging studies  CBC: Recent Labs  Lab 04/22/22 0515 04/23/22 0401 04/24/22 0415 04/26/22 0531 04/27/22 1315  WBC 16.0* 16.1* 16.4* 16.4* 15.3*  HGB 8.8* 9.2* 9.3* 10.2* 10.5*  HCT 26.5* 28.7* 28.1* 30.4* 31.5*  MCV 104.3* 106.3* 103.7* 105.2* 104.3*  PLT 312 316 295 313 334     Basic Metabolic Panel: Recent Labs  Lab 04/22/22 0515 04/23/22 0401 04/24/22 0415 04/25/22 0500 04/26/22 0531 04/27/22 0344 04/28/22 0235  NA 140 139 139 141 145 141 138  K 4.0 4.2 4.2 3.9 4.1 3.9 4.0  CL 105 105 106 109 106 106 103  CO2 _0 GLUCOSE 169* 200* 184* 166* 179* 164* 121*  BUN  _0 25* 27* 27* 23  CREATININE 0.83 0.84 0.83 0.83 0.78 0.82 0.74  CALCIUM 8.8* 9.1 9.2 9.2 9.5 9.2 9.4  MG 2.4 2.4  --   --  2.5* 2.4 2.1  PHOS 3.6 3.7  --   --  4.1 3.6 3.4     GFR: Estimated Creatinine Clearance: 115.9 mL/min (by C-G formula based on SCr of 0.74 mg/dL).  Liver Function Tests: Recent Labs  Lab 04/23/22 0401 04/26/22 0531  AST 51* 81*  ALT 105* 164*  ALKPHOS 106 130*  BILITOT 0.8 0.8  PROT 6.5 7.3  ALBUMIN 2.7* 3.1*      CBG: Recent Labs  Lab 04/27/22 1143 04/27/22 1807 04/28/22 0020 04/28/22 0521 04/28/22 0749  GLUCAP 128* 154* 119* 123* 99     Lipid Profile: Recent Labs    04/26/22 0531 04/27/22 0344  TRIG 328* 337*      Radiology Studies: CT ABDOMEN PELVIS W CONTRAST  Result Date: 04/27/2022 CLINICAL DATA:  Abdominal pain and leukocytosis status post lysis of adhesions for small-bowel obstruction 10 days ago. EXAM: CT ABDOMEN AND PELVIS WITH CONTRAST TECHNIQUE: Multidetector CT imaging of the abdomen and pelvis was performed using the  standard protocol following bolus administration of intravenous contrast. RADIATION DOSE REDUCTION: This exam was performed according to the departmental dose-optimization program which includes automated exposure control, adjustment of the mA and/or kV according to patient size and/or use of iterative reconstruction technique. CONTRAST:  65m OMNIPAQUE IOHEXOL 350 MG/ML SOLN COMPARISON:  CT abdomen pelvis dated April 14, 2022. FINDINGS: Lower chest: Bibasilar atelectasis. Hepatobiliary: No focal liver abnormality is seen. No gallstones, gallbladder wall thickening, or biliary dilatation. Pancreas: Unremarkable. No pancreatic ductal dilatation or surrounding inflammatory changes. Spleen: Normal in size without focal abnormality. Adrenals/Urinary Tract: Adrenal glands are unremarkable. Kidneys are normal, without renal calculi, focal lesion, or hydronephrosis. Bladder is unremarkable. Stomach/Bowel: The stomach is within normal limits. Mildly improved but continued mildly dilated loops of small bowel, with gradual transition to nondilated ileum. The distal and terminal ileum remain sharply decompressed. No bowel wall thickening or surrounding inflammatory changes. The colon is unremarkable. Normal appendix. Vascular/Lymphatic: Aortic atherosclerosis. No enlarged abdominal or pelvic lymph nodes. Reproductive: Prostate is unremarkable. Other: Unchanged small fat containing bilateral inguinal hernias. No free fluid or pneumoperitoneum. Musculoskeletal: No acute or significant osseous findings. IMPRESSION: 1. No intra-abdominal fluid collection. 2. Mildly improved but continued multiple mildly dilated loops of small bowel without discrete transition point identified. The distal and terminal ileum remain sharply decompressed. Findings could reflect ileus or ongoing partial small bowel obstruction. 3. Aortic Atherosclerosis (ICD10-I70.0). Electronically Signed   By: WTitus DubinM.D.   On: 04/27/2022 15:50        LOS: 19 days   Lashondra Vaquerano [employer] [email or web address]  04/28/2022, 9:52 AM

## 2022-04-28 NOTE — Progress Notes (Signed)
Patient ID: Cody [name], male   DOB: Sep 06, 1960, 61 y.o.   MRN: [id number] [facility] Surgery Progress Note:   11 Days Post-Op   THE PLAN  He is started on full liquid diet and will assess adequacy of intake before discontinuing TNA.  CT suggests that he may have a distal partial SBO or ileus.    Subjective: Mental status is clear-watching TV.  Complaints hungry. Objective: Vital signs in last 24 hours: Temp:  [98.2 F (36.8 C)-98.8 F (37.1 C)] 98.8 F (37.1 C) (09/16 0509) Pulse Rate:  [97-110] 97 (09/16 0509) Resp:  [16-18] 18 (09/15 2014) BP: (111-121)/(76-87) 115/76 (09/16 0509) SpO2:  [93 %-94 %] 94 % (09/16 0509) Weight:  [101.1 kg] 101.1 kg (09/16 0500)  Intake/Output from previous day: 09/15 0701 - 09/16 0700 In: 958.3 [P.O.:240; I.V.:718.3] Out: 1350 [Urine:1350] Intake/Output this shift: No intake/output data recorded.  Physical Exam: Work of breathing is normal.  Abdomen is nontender  Lab Results:  Results for orders placed or performed during the hospital encounter of 04/09/22 (from the past 48 hour(s))  Glucose, capillary     Status: Abnormal   Collection Time: 04/26/22 12:15 PM  Result Value Ref Range   Glucose-Capillary 191 (H) 70 - 99 mg/dL    Comment: Glucose reference range applies only to samples taken after fasting for at least 8 hours.  Glucose, capillary     Status: Abnormal   Collection Time: 04/26/22  6:20 PM  Result Value Ref Range   Glucose-Capillary 187 (H) 70 - 99 mg/dL    Comment: Glucose reference range applies only to samples taken after fasting for at least 8 hours.  Glucose, capillary     Status: Abnormal   Collection Time: 04/26/22  9:50 PM  Result Value Ref Range   Glucose-Capillary 178 (H) 70 - 99 mg/dL    Comment: Glucose reference range applies only to samples taken after fasting for at least 8 hours.  Glucose, capillary     Status: Abnormal   Collection Time: 04/27/22 12:27 AM  Result Value Ref Range   Glucose-Capillary  190 (H) 70 - 99 mg/dL    Comment: Glucose reference range applies only to samples taken after fasting for at least 8 hours.  Triglycerides     Status: Abnormal   Collection Time: 04/27/22  3:44 AM  Result Value Ref Range   Triglycerides 337 (H) <150 mg/dL    Comment: Performed at [facility] [address], [name], [place] [address]  Basic metabolic panel     Status: Abnormal   Collection Time: 04/27/22  3:44 AM  Result Value Ref Range   Sodium 141 135 - 145 mmol/L   Potassium 3.9 3.5 - 5.1 mmol/L   Chloride 106 98 - 111 mmol/L   CO2 25 22 - 32 mmol/L   Glucose, Bld 164 (H) 70 - 99 mg/dL    Comment: Glucose reference range applies only to samples taken after fasting for at least 8 hours.   BUN 27 (H) 8 - 23 mg/dL   Creatinine, Ser 0.82 0.61 - 1.24 mg/dL   Calcium 9.2 8.9 - 10.3 mg/dL   GFR, Estimated >60 >60 mL/min    Comment: (NOTE) Calculated using the CKD-EPI Creatinine Equation (2021)    Anion gap 10 5 - 15    Comment: Performed at [facility] [address] [place], [place] [address]  Magnesium     Status: None   Collection Time: 04/27/22  3:44  AM  Result Value Ref Range   Magnesium 2.4 1.7 - 2.4 mg/dL    Comment: Performed at [facility], [address], [place], [place] [address]  Phosphorus     Status: None   Collection Time: 04/27/22  3:44 AM  Result Value Ref Range   Phosphorus 3.6 2.5 - 4.6 mg/dL    Comment: Performed at [facility], [address], [place], [place] [address]  Glucose, capillary     Status: Abnormal   Collection Time: 04/27/22  6:07 AM  Result Value Ref Range   Glucose-Capillary 155 (H) 70 - 99 mg/dL    Comment: Glucose reference range applies only to samples taken after fasting for at least 8 hours.  Glucose, capillary     Status: Abnormal   Collection Time: 04/27/22 11:43 AM  Result Value Ref Range   Glucose-Capillary 128 (H) 70 - 99 mg/dL    Comment: Glucose reference range applies only to samples taken after  fasting for at least 8 hours.  CBC     Status: Abnormal   Collection Time: 04/27/22  1:15 PM  Result Value Ref Range   WBC 15.3 (H) 4.0 - 10.5 K/uL   RBC 3.02 (L) 4.22 - 5.81 MIL/uL   Hemoglobin 10.5 (L) 13.0 - 17.0 g/dL   HCT 93.7 (L) 16.9 - 67.8 %   MCV 104.3 (H) 80.0 - 100.0 fL   MCH 34.8 (H) 26.0 - 34.0 pg   MCHC 33.3 30.0 - 36.0 g/dL   RDW 93.8 10.1 - 75.1 %   Platelets 334 150 - 400 K/uL   nRBC 0.5 (H) 0.0 - 0.2 %    Comment: Performed at [facility], [address], [place], [place] [address]  Glucose, capillary     Status: Abnormal   Collection Time: 04/27/22  6:07 PM  Result Value Ref Range   Glucose-Capillary 154 (H) 70 - 99 mg/dL    Comment: Glucose reference range applies only to samples taken after fasting for at least 8 hours.  Glucose, capillary     Status: Abnormal   Collection Time: 04/28/22 12:20 AM  Result Value Ref Range   Glucose-Capillary 119 (H) 70 - 99 mg/dL    Comment: Glucose reference range applies only to samples taken after fasting for at least 8 hours.   Comment 1 Notify RN    Comment 2 Document in Chart   Basic metabolic panel     Status: Abnormal   Collection Time: 04/28/22  2:35 AM  Result Value Ref Range   Sodium 138 135 - 145 mmol/L   Potassium 4.0 3.5 - 5.1 mmol/L   Chloride 103 98 - 111 mmol/L   CO2 26 22 - 32 mmol/L   Glucose, Bld 121 (H) 70 - 99 mg/dL    Comment: Glucose reference range applies only to samples taken after fasting for at least 8 hours.   BUN 23 8 - 23 mg/dL   Creatinine, Ser 2.77 0.61 - 1.24 mg/dL   Calcium 9.4 8.9 - 82.4 mg/dL   GFR, Estimated >23 >53 mL/min    Comment: (NOTE) Calculated using the CKD-EPI Creatinine Equation (2021)    Anion gap 9 5 - 15    Comment: Performed at [facility], [address], [place], [place] [address]  Magnesium     Status: None   Collection Time: 04/28/22  2:35 AM  Result Value Ref Range   Magnesium 2.1 1.7 - 2.4 mg/dL  Comment: Performed at [facility]  Lab, [address], [place], [place] [address]  Phosphorus     Status: None   Collection Time: 04/28/22  2:35 AM  Result Value Ref Range   Phosphorus 3.4 2.5 - 4.6 mg/dL    Comment: Performed at [facility], [address] [place], [place] [address]  Glucose, capillary     Status: Abnormal   Collection Time: 04/28/22  5:21 AM  Result Value Ref Range   Glucose-Capillary 123 (H) 70 - 99 mg/dL    Comment: Glucose reference range applies only to samples taken after fasting for at least 8 hours.  Glucose, capillary     Status: None   Collection Time: 04/28/22  7:49 AM  Result Value Ref Range   Glucose-Capillary 99 70 - 99 mg/dL    Comment: Glucose reference range applies only to samples taken after fasting for at least 8 hours.    Radiology/Results: CT ABDOMEN PELVIS W CONTRAST  Result Date: 04/27/2022 CLINICAL DATA:  Abdominal pain and leukocytosis status post lysis of adhesions for small-bowel obstruction 10 days ago. EXAM: CT ABDOMEN AND PELVIS WITH CONTRAST TECHNIQUE: Multidetector CT imaging of the abdomen and pelvis was performed using the standard protocol following bolus administration of intravenous contrast. RADIATION DOSE REDUCTION: This exam was performed according to the departmental dose-optimization program which includes automated exposure control, adjustment of the mA and/or kV according to patient size and/or use of iterative reconstruction technique. CONTRAST:  52mL OMNIPAQUE IOHEXOL 350 MG/ML SOLN COMPARISON:  CT abdomen pelvis dated April 14, 2022. FINDINGS: Lower chest: Bibasilar atelectasis. Hepatobiliary: No focal liver abnormality is seen. No gallstones, gallbladder wall thickening, or biliary dilatation. Pancreas: Unremarkable. No pancreatic ductal dilatation or surrounding inflammatory changes. Spleen: Normal in size without focal abnormality. Adrenals/Urinary Tract: Adrenal glands are unremarkable. Kidneys are normal, without renal calculi, focal lesion, or  hydronephrosis. Bladder is unremarkable. Stomach/Bowel: The stomach is within normal limits. Mildly improved but continued mildly dilated loops of small bowel, with gradual transition to nondilated ileum. The distal and terminal ileum remain sharply decompressed. No bowel wall thickening or surrounding inflammatory changes. The colon is unremarkable. Normal appendix. Vascular/Lymphatic: Aortic atherosclerosis. No enlarged abdominal or pelvic lymph nodes. Reproductive: Prostate is unremarkable. Other: Unchanged small fat containing bilateral inguinal hernias. No free fluid or pneumoperitoneum. Musculoskeletal: No acute or significant osseous findings. IMPRESSION: 1. No intra-abdominal fluid collection. 2. Mildly improved but continued multiple mildly dilated loops of small bowel without discrete transition point identified. The distal and terminal ileum remain sharply decompressed. Findings could reflect ileus or ongoing partial small bowel obstruction. 3. Aortic Atherosclerosis (ICD10-I70.0). Electronically Signed   By: Obie Dredge M.D.   On: 04/27/2022 15:50    Anti-infectives: Anti-infectives (From admission, onward)    Start     Dose/Rate Route Frequency Ordered Stop   04/17/22 1100  ceFAZolin (ANCEF) IVPB 2g/100 mL premix        2 g 200 mL/hr over 30 Minutes Intravenous On call to O.R. 04/17/22 1045 04/17/22 1150   04/09/22 1100  Ampicillin-Sulbactam (UNASYN) 3 g in sodium chloride 0.9 % 100 mL IVPB  Status:  Discontinued        3 g 200 mL/hr over 30 Minutes Intravenous Every 8 hours 04/09/22 1045 04/14/22 1419       Assessment/Plan: Problem List: Patient Active Problem List   Diagnosis Date Noted   Acute blood loss anemia 04/19/2022   Ileus (HCC) 04/13/2022   DKA (diabetic ketoacidosis) (HCC) 04/09/2022  AKI (acute kidney injury) (HCC) 04/09/2022   Dehydration 04/09/2022   SIRS (systemic inflammatory response syndrome) (HCC) 04/09/2022   Allergic rhinitis 04/09/2022   Cocaine  abuse (HCC) 01/22/2018   Acute metabolic encephalopathy 01/18/2018   Community acquired pneumonia 01/18/2018   Bilateral pulmonary infiltrates on chest x-ray 01/18/2018   Suspected carbon monoxide poisoning 01/18/2018   Macrocytic anemia 01/18/2018   Severe sepsis (HCC) 01/18/2018   Renal insufficiency 11/15/2017   Post-traumatic osteoarthritis of both hips 11/15/2017   Chronic midline low back pain without sciatica 11/15/2017   Weight loss, unintentional 10/07/2017   Immunization due 06/06/2017   Tobacco use 06/06/2017   Posterior tibial tendinitis, left leg 04/25/2017   Acute seasonal allergic rhinitis due to pollen 11/22/2016   Poor dentition 11/22/2016   Cervical radicular pain 09/27/2016   Uncomplicated alcohol dependence (HCC) 09/27/2016   Tibia/fibula fracture, left, open type III, with routine healing, subsequent encounter 09/10/2016   S/P ORIF (open reduction internal fixation) fracture 10/19/2015   Chronic pain of right knee 12/03/2014   GERD (gastroesophageal reflux disease) 12/03/2014   Smoking 12/03/2014    Monitor PO intake.  Observation .  CT no evidence of abscess or cause of leukocytosis 11 Days Post-Op    LOS: 19 days   Matt B. Daphine DeutscherMartin, MD, [facility] HospitalFACS  [facility] [phone] to reach the surgeon on call.    04/28/2022 10:16 AM

## 2022-04-29 ENCOUNTER — Inpatient Hospital Stay (HOSPITAL_COMMUNITY): Payer: Medicaid Other

## 2022-04-29 DIAGNOSIS — R7989 Other specified abnormal findings of blood chemistry: Secondary | ICD-10-CM | POA: Diagnosis not present

## 2022-04-29 DIAGNOSIS — E1165 Type 2 diabetes mellitus with hyperglycemia: Secondary | ICD-10-CM | POA: Diagnosis not present

## 2022-04-29 DIAGNOSIS — K56609 Unspecified intestinal obstruction, unspecified as to partial versus complete obstruction: Secondary | ICD-10-CM | POA: Diagnosis not present

## 2022-04-29 LAB — GLUCOSE, CAPILLARY
Glucose-Capillary: 164 mg/dL — ABNORMAL HIGH (ref 70–99)
Glucose-Capillary: 137 mg/dL — ABNORMAL HIGH (ref 70–99)
Glucose-Capillary: 121 mg/dL — ABNORMAL HIGH (ref 70–99)
Glucose-Capillary: 204 mg/dL — ABNORMAL HIGH (ref 70–99)

## 2022-04-29 MED ORDER — TRAVASOL 10 % IV SOLN
INTRAVENOUS | Status: AC
  Filled 2022-04-29: qty 1296

## 2022-04-29 MED ORDER — PANTOPRAZOLE SODIUM 40 MG PO TBEC: 40.0000 mg | DELAYED_RELEASE_TABLET | Freq: Every day | ORAL | Status: DC

## 2022-04-29 MED ORDER — ADULT MULTIVITAMIN LIQUID CH
15.0000 mL | Freq: Every day | ORAL | Status: DC
  Administered 2022-04-29: 15 mL via ORAL
  Filled 2022-04-29 (×3): qty 15

## 2022-04-29 MED ORDER — PANTOPRAZOLE 2 MG/ML SUSPENSION
40.0000 mg | Freq: Every evening | ORAL | Status: DC
  Administered 2022-04-29: 40 mg via ORAL
  Filled 2022-04-29 (×2): qty 20

## 2022-04-29 NOTE — Significant Event (Signed)
Patient was having 2 episodes of large-volume vomitus.  On exam at bedside abdomen appears distended bowel sounds not appreciated.  Ordered x-ray of the abdomen stat.  Shows worsening of dilated small bowels concerning for developing obstruction versus ileus.  Discussed with Dr. Kae Heller on-call general surgeon who advised placing patient on NG tube suction.  Cody [name]

## 2022-04-29 NOTE — Progress Notes (Signed)
Patient ID: Cody [name], male   DOB: [birth year], 61 y.o.   MRN: [id number] [facility] Surgery Progress Note:   12 Days Post-Op   THE PLAN Will obtain xrays of abdomen today.  K ok.  Will check abdominal xrays.   Continued observation; awaiting diet advancement on passage of BM  Subjective: Mental status is OK.  Sleeping and didn't want to be too cooperative.  Complaints none expressed. Objective: Vital signs in last 24 hours: Temp:  [98.2 F (36.8 C)-98.7 F (37.1 C)] 98.3 F (36.8 C) (09/17 0942) Pulse Rate:  [94-101] 94 (09/17 0942) Resp:  [16-18] 18 (09/17 0942) BP: (101-117)/(69-74) 105/69 (09/17 0942) SpO2:  [92 %-100 %] 94 % (09/17 0942) Weight:  [100.8 kg] 100.8 kg (09/17 0601)  Intake/Output from previous day: 09/16 0701 - 09/17 0700 In: 1689.5 [P.O.:300; I.V.:1389.5] Out: 1300 [Urine:1300] Intake/Output this shift: No intake/output data recorded.  Physical Exam: Work of breathing is not labored but restricted by the abdominal binder that the patient was wearing.  Abdomen is somewhat protuberant but nontender to touch  Lab Results:  Results for orders placed or performed during the hospital encounter of 04/09/22 (from the past 48 hour(s))  Glucose, capillary     Status: Abnormal   Collection Time: 04/27/22 11:43 AM  Result Value Ref Range   Glucose-Capillary 128 (H) 70 - 99 mg/dL    Comment: Glucose reference range applies only to samples taken after fasting for at least 8 hours.  CBC     Status: Abnormal   Collection Time: 04/27/22  1:15 PM  Result Value Ref Range   WBC 15.3 (H) 4.0 - 10.5 K/uL   RBC 3.02 (L) 4.22 - 5.81 MIL/uL   Hemoglobin 10.5 (L) 13.0 - 17.0 g/dL   HCT 97.5 (L) 88.3 - 25.4 %   MCV 104.3 (H) 80.0 - 100.0 fL   MCH 34.8 (H) 26.0 - 34.0 pg   MCHC 33.3 30.0 - 36.0 g/dL   RDW 98.2 64.1 - 58.3 %   Platelets 334 150 - 400 K/uL   nRBC 0.5 (H) 0.0 - 0.2 %    Comment: Performed at [facility], [address] [place], [place] [address]   Glucose, capillary     Status: Abnormal   Collection Time: 04/27/22  6:07 PM  Result Value Ref Range   Glucose-Capillary 154 (H) 70 - 99 mg/dL    Comment: Glucose reference range applies only to samples taken after fasting for at least 8 hours.  Glucose, capillary     Status: Abnormal   Collection Time: 04/28/22 12:20 AM  Result Value Ref Range   Glucose-Capillary 119 (H) 70 - 99 mg/dL    Comment: Glucose reference range applies only to samples taken after fasting for at least 8 hours.   Comment 1 Notify RN    Comment 2 Document in Chart   Basic metabolic panel     Status: Abnormal   Collection Time: 04/28/22  2:35 AM  Result Value Ref Range   Sodium 138 135 - 145 mmol/L   Potassium 4.0 3.5 - 5.1 mmol/L   Chloride 103 98 - 111 mmol/L   CO2 26 22 - 32 mmol/L   Glucose, Bld 121 (H) 70 - 99 mg/dL    Comment: Glucose reference range applies only to samples taken after fasting for at least 8 hours.   BUN 23 8 - 23 mg/dL   Creatinine, Ser 6.80 0.61 - 1.24 mg/dL   Calcium 9.4 8.9 -  10.3 mg/dL   GFR, Estimated >60 >60 mL/min    Comment: (NOTE) Calculated using the CKD-EPI Creatinine Equation (2021)    Anion gap 9 5 - 15    Comment: Performed at [facility], [place] [address] [place], [place] [address]  Magnesium     Status: None   Collection Time: 04/28/22  2:35 AM  Result Value Ref Range   Magnesium 2.1 1.7 - 2.4 mg/dL    Comment: Performed at [facility] [address] [place], [place] [address]  Phosphorus     Status: None   Collection Time: 04/28/22  2:35 AM  Result Value Ref Range   Phosphorus 3.4 2.5 - 4.6 mg/dL    Comment: Performed at [facility] [address] [place], [place] [address]  Glucose, capillary     Status: Abnormal   Collection Time: 04/28/22  5:21 AM  Result Value Ref Range   Glucose-Capillary 123 (H) 70 - 99 mg/dL    Comment: Glucose reference range applies only to samples taken after fasting for at least 8 hours.  Glucose,  capillary     Status: None   Collection Time: 04/28/22  7:49 AM  Result Value Ref Range   Glucose-Capillary 99 70 - 99 mg/dL    Comment: Glucose reference range applies only to samples taken after fasting for at least 8 hours.  Glucose, capillary     Status: Abnormal   Collection Time: 04/28/22 12:21 PM  Result Value Ref Range   Glucose-Capillary 122 (H) 70 - 99 mg/dL    Comment: Glucose reference range applies only to samples taken after fasting for at least 8 hours.  Glucose, capillary     Status: Abnormal   Collection Time: 04/28/22  4:26 PM  Result Value Ref Range   Glucose-Capillary 123 (H) 70 - 99 mg/dL    Comment: Glucose reference range applies only to samples taken after fasting for at least 8 hours.  Glucose, capillary     Status: Abnormal   Collection Time: 04/29/22  8:03 AM  Result Value Ref Range   Glucose-Capillary 164 (H) 70 - 99 mg/dL    Comment: Glucose reference range applies only to samples taken after fasting for at least 8 hours.    Radiology/Results: CT ABDOMEN PELVIS W CONTRAST  Result Date: 04/27/2022 CLINICAL DATA:  Abdominal pain and leukocytosis status post lysis of adhesions for small-bowel obstruction 10 days ago. EXAM: CT ABDOMEN AND PELVIS WITH CONTRAST TECHNIQUE: Multidetector CT imaging of the abdomen and pelvis was performed using the standard protocol following bolus administration of intravenous contrast. RADIATION DOSE REDUCTION: This exam was performed according to the departmental dose-optimization program which includes automated exposure control, adjustment of the mA and/or kV according to patient size and/or use of iterative reconstruction technique. CONTRAST:  6mL OMNIPAQUE IOHEXOL 350 MG/ML SOLN COMPARISON:  CT abdomen pelvis dated April 14, 2022. FINDINGS: Lower chest: Bibasilar atelectasis. Hepatobiliary: No focal liver abnormality is seen. No gallstones, gallbladder wall thickening, or biliary dilatation. Pancreas: Unremarkable. No  pancreatic ductal dilatation or surrounding inflammatory changes. Spleen: Normal in size without focal abnormality. Adrenals/Urinary Tract: Adrenal glands are unremarkable. Kidneys are normal, without renal calculi, focal lesion, or hydronephrosis. Bladder is unremarkable. Stomach/Bowel: The stomach is within normal limits. Mildly improved but continued mildly dilated loops of small bowel, with gradual transition to nondilated ileum. The distal and terminal ileum remain sharply decompressed. No bowel wall thickening or surrounding inflammatory changes. The colon is unremarkable. Normal appendix.  Vascular/Lymphatic: Aortic atherosclerosis. No enlarged abdominal or pelvic lymph nodes. Reproductive: Prostate is unremarkable. Other: Unchanged small fat containing bilateral inguinal hernias. No free fluid or pneumoperitoneum. Musculoskeletal: No acute or significant osseous findings. IMPRESSION: 1. No intra-abdominal fluid collection. 2. Mildly improved but continued multiple mildly dilated loops of small bowel without discrete transition point identified. The distal and terminal ileum remain sharply decompressed. Findings could reflect ileus or ongoing partial small bowel obstruction. 3. Aortic Atherosclerosis (ICD10-I70.0). Electronically Signed   By: Obie Dredge M.D.   On: 04/27/2022 15:50    Anti-infectives: Anti-infectives (From admission, onward)    Start     Dose/Rate Route Frequency Ordered Stop   04/17/22 1100  ceFAZolin (ANCEF) IVPB 2g/100 mL premix        2 g 200 mL/hr over 30 Minutes Intravenous On call to O.R. 04/17/22 1045 04/17/22 1150   04/09/22 1100  Ampicillin-Sulbactam (UNASYN) 3 g in sodium chloride 0.9 % 100 mL IVPB  Status:  Discontinued        3 g 200 mL/hr over 30 Minutes Intravenous Every 8 hours 04/09/22 1045 04/14/22 1419       Assessment/Plan: Problem List: Patient Active Problem List   Diagnosis Date Noted   Acute blood loss anemia 04/19/2022   Ileus (HCC) 04/13/2022    DKA (diabetic ketoacidosis) (HCC) 04/09/2022   AKI (acute kidney injury) (HCC) 04/09/2022   Dehydration 04/09/2022   SIRS (systemic inflammatory response syndrome) (HCC) 04/09/2022   Allergic rhinitis 04/09/2022   Cocaine abuse (HCC) 01/22/2018   Acute metabolic encephalopathy 01/18/2018   Community acquired pneumonia 01/18/2018   Bilateral pulmonary infiltrates on chest x-ray 01/18/2018   Suspected carbon monoxide poisoning 01/18/2018   Macrocytic anemia 01/18/2018   Severe sepsis (HCC) 01/18/2018   Renal insufficiency 11/15/2017   Post-traumatic osteoarthritis of both hips 11/15/2017   Chronic midline low back pain without sciatica 11/15/2017   Weight loss, unintentional 10/07/2017   Immunization due 06/06/2017   Tobacco use 06/06/2017   Posterior tibial tendinitis, left leg 04/25/2017   Acute seasonal allergic rhinitis due to pollen 11/22/2016   Poor dentition 11/22/2016   Cervical radicular pain 09/27/2016   Uncomplicated alcohol dependence (HCC) 09/27/2016   Tibia/fibula fracture, left, open type III, with routine healing, subsequent encounter 09/10/2016   S/P ORIF (open reduction internal fixation) fracture 10/19/2015   Chronic pain of right knee 12/03/2014   GERD (gastroesophageal reflux disease) 12/03/2014   Smoking 12/03/2014    Prolonged ileus after exploratory laparotomy--K 4 today.  He vomited last evening.  Will get flat and upright abdomen xrays today.   12 Days Post-Op    LOS: 20 days   Matt B. Daphine Deutscher, MD, [facility] [phone] to reach the surgeon on call.    04/29/2022 9:55 AM

## 2022-04-29 NOTE — Progress Notes (Addendum)
PHARMACY - TOTAL PARENTERAL NUTRITION CONSULT NOTE   Indication: Small bowel obstruction/prolonged ileus  Patient Measurements: Height: 5\' 11"  (180.3 cm) Weight: 100.8 kg (222 lb 3.6 oz) IBW/kg (Calculated) : 75.3 TPN AdjBW (KG): 82.3 Body mass index is 30.99 kg/m. Usual Weight: 100-103 kg  Assessment:  61 yo M with newly diagnosed type 2 diabetes. Patient developed ileus 9/1 which then evolved into SBO now s/p ex lap 04/17/22. Pt was eating ~50% of dysphagia/carb diet from 8/28-9/1. NPO since 04/14/22. Anticipate prolonged ileus - pharmacy consulted for TPN.   Glucose / Insulin: A1c 11.7%, BG 99-164; used 7 u SSI in 24h  Electrolytes: (last labs /16) CoCa 10.1, others WNL Renal: Scr 0.72 ; BUN wnl Hepatic: AST/ALT 81/164 (increased); Trigs 337 (stable); Albumin 3.1 Intake / Output; MIVF: UOP 0.5 mL/kg/hr (Lasix 40 IV daily), emesis x1, LBM 9/16  GI Imaging: 9/3 CT AP: c/f partial or developing SBO  9/4 Ab Xray: c/f distal obstruction or ileus 9/8 Ab Xray: multiple dilated SB compatible with post-op ileus 9/12 Ab Xray: Mild ileus unchanged 9/15 CT: mildly improved ileus vs ongoing partial SBO; No intra-abdominal fluid collection GI Surgeries / Procedures:  9/5 Ex lap, lysis of adhesions (no abscess, no diverticulitis)  9/5 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 9/14 NGT removed   Central access: PICC 04/17/22 TPN start date: 04/18/22  Nutritional Goals: Goal TPN rate is 90 mL/hr (provides 129 g of protein, 73g lipid, 296g dextrose, and 2257 kcals per day)  RD Assessment: Estimated Needs Total Energy Estimated Needs: 2200-2400 Total Protein Estimated Needs: 110-125 grams Total Fluid Estimated Needs: >/= 2 L  Current Nutrition:  TPN 9/14 CLD 9/15 FLD- ate some soup, no ensure; No TPN given d/t compounder broken down  9/16 FLD- had 1 ensure, 1 soup, 1 pudding; had emesis x1 9/17 FLD- working on ensure  Plan:  Cycle goal TPN  over 12 hours (98-196 mL/hr, 2.2-4.5 mg/kg/min), provides 130 g protein, 2202 kcal, providing 100% of needs Electrolytes in TPN: Continue Na 75 mEq/L,  K 20 mEq/L, Ca 0 mEq/L, Mg 4 mEq/L, Phos 5 mmol/L; Cl:Ac 1:1 Change standard MVI to PO, removed from TPN  Continue moderate SSI TID AC and adjust as needed Decrease regular insulin 60 units in TPN  Monitor TPN labs Mon/Thurs, and as indicated F/u PO intake, wean TPN as able   Thank you for involving pharmacy in this patient's care. Benetta Spar, PharmD, BCPS, BCCP Clinical Pharmacist  Please check AMION for all [facility] phone numbers After 10:00 PM, call [place] [phone]

## 2022-04-29 NOTE — Progress Notes (Signed)
[facility] PROGRESS NOTE   Cody [name] JOI:[id number] DOB: 03/01/1961 DOA: 04/09/2022  PCP: Ladell Pier, MD  Brief History/Interval Summary:  61 y.o. male with a history of GERD, allergic rhinitis who presented 8/28 from [facility] with confusion found to be in DKA (new diagnosis of diabetes). Neuroimaging showed no stroke. the patient was also hypoxic, diagnosed with aspiration pneumonia and improved on antibiotics. DKA, AKI, encephalopathy all resolved.    He developed abdominal pain and distention, vomiting with SBO shown on CT. Surgery was consulted, performed ex lap with LOA by Dr. Brantley Stage on 9/5 with biopsy suggestive of adhesion.  Patient was started on TPN.  Consultants: General surgery.  Procedures: Exploratory laparotomy    Subjective/Interval History: Overnight events noted.  Patient had an episode of emesis yesterday evening.  He also experience more abdominal pain.  He mentioned that he has been passing gas but has not had any bowel movement.  Somewhat less talkative today.  Denies any shortness of breath.    Assessment/Plan:  Small bowel obstruction/postoperative ileus S/p ex lap, LOA by Dr. Brantley Stage 9/5. Biopsy reassuring with benign dense fibrovascular stroma suggestive of adhesion.  Surgery is managing.  NG tube was pulled out on 9/14.  Tolerated clear liquid diet.  Advanced to full liquids.   Experienced more abdominal pain yesterday with episode of vomiting.  Looks like general surgery is ordering abdominal x-rays.  Defer management to them.  CT scan of the abdomen done on 9/15 did not show any abscess.  WBC remains persistently elevated.  He is afebrile however. Check labs tomorrow.   T2DM newly diagnosed presenting with DKA, hyperglycemia A1c 11.7%. C-peptide low at 0.6, consider recheck/stim testing outside scope of DKA/acute illness.  Was on long-acting insulin.  However due to small bowel obstruction and n.p.o. status this was  discontinued.  He is getting insulin through his TPN.  He is also on SSI. Need to transition back to subcutaneous long-acting insulin once he is off of TPN. CBGs are reasonably well controlled.   Acute hypoxic respiratory failure/aspiration pneumonia/sepsis present on admission Initially related to aspiration pneumonia and sepsis. ?if now also due to atelectasis.  TTE with normal LVEF and G1 DD. Patient was diuresed.  Renal function is stable.   CT scan did not show any PE.  There were opacities suggestive of infiltrate versus edema. Incentive spirometry. Remains on Lasix once daily. Respiratory status is stable.  Titrating normal on room air.   Hyperlipidemia LDL 151.  Resume statin at discharge.   Acute kidney injury In the setting of DKA.  Resolved.   Acute metabolic encephalopathy/strokelike symptoms CVA work-up unrevealing.  EEG unremarkable. Resolved.   Abnormal LFTs Mildly elevated AST and ALT noted.  No abnormality in the hepatobiliary system on CT scan. We will check labs tomorrow.  Will check hepatitis panel.   Acute blood loss anemia Drop in hemoglobin is likely dilutional.  No evidence of overt blood loss.   Sinus tachycardia Improved. No fevers, no PE on CTA. IV metoprolol as needed HR > 120.   GERD: - Continue PPI   Mild hypernatremia/hypercalcemia Likely from dehydration.  Resolved.   Hypokalemia Resolved. Will continue monitoring with IV diuresis   Tobacco use disorder - Encourage cessation - Continue nicotine patch   Generalized weakness/physical deconditioning - PT/OT recommended return to SNF for further therapy   Obesity Estimated body mass index is 30.99 kg/m as calculated from the following:   Height as of this encounter: 5'  11" (1.803 m).   Weight as of this encounter: 100.8 kg.  DVT Prophylaxis: Lovenox Code Status: Full code Family Communication: Discussed with patient.  No family at bedside Disposition Plan: Back to SNF when medically  improved  Status is: Inpatient Remains inpatient appropriate because: Small bowel obstruction      Medications: Scheduled:  bisacodyl  10 mg Rectal Daily   Chlorhexidine Gluconate Cloth  6 each Topical Daily   enoxaparin (LOVENOX) injection  50 mg Subcutaneous Q24H   feeding supplement  237 mL Oral BID BM   furosemide  40 mg Intravenous Daily   insulin aspart  0-15 Units Subcutaneous TID WC   insulin starter kit- pen needles  1 kit Other Once   multivitamin  15 mL Oral Daily   pantoprazole  40 mg Oral QPM   sodium chloride flush  10-40 mL Intracatheter Q12H   Continuous:  sodium chloride 10 mL/hr at 89/21/19 4174   TPN CYCLIC-ADULT (ION) 081 mL/hr at 44/81/85 6314   TPN CYCLIC-ADULT (ION)     HFW:YOVZCH chloride, acetaminophen, alum & mag hydroxide-simeth, dextrose, morphine injection, nicotine, ondansetron (ZOFRAN) IV, mouth rinse, oxyCODONE, phenol, sodium chloride flush  Antibiotics: Anti-infectives (From admission, onward)    Start     Dose/Rate Route Frequency Ordered Stop   04/17/22 1100  ceFAZolin (ANCEF) IVPB 2g/100 mL premix        2 g 200 mL/hr over 30 Minutes Intravenous On call to O.R. 04/17/22 1045 04/17/22 1150   04/09/22 1100  Ampicillin-Sulbactam (UNASYN) 3 g in sodium chloride 0.9 % 100 mL IVPB  Status:  Discontinued        3 g 200 mL/hr over 30 Minutes Intravenous Every 8 hours 04/09/22 1045 04/14/22 1419       Objective:  Vital Signs  Vitals:   04/28/22 1658 04/28/22 2102 04/29/22 0601 04/29/22 0942  BP: 117/74 101/74 108/70 105/69  Pulse: 95 97 (!) 101 94  Resp: '18 16 18 18  ' Temp:  98.2 F (36.8 C) 98.4 F (36.9 C) 98.3 F (36.8 C)  TempSrc:  Oral Oral Oral  SpO2: 100% 95% 92% 94%  Weight:   100.8 kg   Height:        Intake/Output Summary (Last 24 hours) at 04/29/2022 1018 Last data filed at 04/29/2022 0501 Gross per 24 hour  Intake 1689.48 ml  Output 1300 ml  Net 389.48 ml    Filed Weights   04/27/22 0330 04/28/22 0500 04/29/22  0601  Weight: 98.6 kg 101.1 kg 100.8 kg    General appearance: Awake alert.  In no distress Resp: Clear to auscultation bilaterally.  Normal effort Cardio: S1-S2 is normal regular.  No S3-S4.  No rubs murmurs or bruit GI: Abdomen appears to be distended today.  Bowel sounds more sluggish compared to yesterday.  Nontender for the most part. Extremities: No edema.  Full range of motion of lower extremities. Neurologic: No focal neurological deficits.    Lab Results:  Data Reviewed: I have personally reviewed following labs and reports of the imaging studies  CBC: Recent Labs  Lab 04/23/22 0401 04/24/22 0415 04/26/22 0531 04/27/22 1315  WBC 16.1* 16.4* 16.4* 15.3*  HGB 9.2* 9.3* 10.2* 10.5*  HCT 28.7* 28.1* 30.4* 31.5*  MCV 106.3* 103.7* 105.2* 104.3*  PLT 316 295 313 334     Basic Metabolic Panel: Recent Labs  Lab 04/23/22 0401 04/24/22 0415 04/25/22 0500 04/26/22 0531 04/27/22 0344 04/28/22 0235  NA 139 139 141 145 141 138  K  4.2 4.2 3.9 4.1 3.9 4.0  CL 105 106 109 106 106 103  CO2 '25 24 26 26 25 26  ' GLUCOSE 200* 184* 166* 179* 164* 121*  BUN 19 23 25* 27* 27* 23  CREATININE 0.84 0.83 0.83 0.78 0.82 0.74  CALCIUM 9.1 9.2 9.2 9.5 9.2 9.4  MG 2.4  --   --  2.5* 2.4 2.1  PHOS 3.7  --   --  4.1 3.6 3.4     GFR: Estimated Creatinine Clearance: 115.8 mL/min (by C-G formula based on SCr of 0.74 mg/dL).  Liver Function Tests: Recent Labs  Lab 04/23/22 0401 04/26/22 0531  AST 51* 81*  ALT 105* 164*  ALKPHOS 106 130*  BILITOT 0.8 0.8  PROT 6.5 7.3  ALBUMIN 2.7* 3.1*      CBG: Recent Labs  Lab 04/28/22 0521 04/28/22 0749 04/28/22 1221 04/28/22 1626 04/29/22 0803  GLUCAP 123* 99 122* 123* 164*     Lipid Profile: Recent Labs    04/27/22 0344  TRIG 337*      Radiology Studies: CT ABDOMEN PELVIS W CONTRAST  Result Date: 04/27/2022 CLINICAL DATA:  Abdominal pain and leukocytosis status post lysis of adhesions for small-bowel obstruction 10  days ago. EXAM: CT ABDOMEN AND PELVIS WITH CONTRAST TECHNIQUE: Multidetector CT imaging of the abdomen and pelvis was performed using the standard protocol following bolus administration of intravenous contrast. RADIATION DOSE REDUCTION: This exam was performed according to the departmental dose-optimization program which includes automated exposure control, adjustment of the mA and/or kV according to patient size and/or use of iterative reconstruction technique. CONTRAST:  68m OMNIPAQUE IOHEXOL 350 MG/ML SOLN COMPARISON:  CT abdomen pelvis dated April 14, 2022. FINDINGS: Lower chest: Bibasilar atelectasis. Hepatobiliary: No focal liver abnormality is seen. No gallstones, gallbladder wall thickening, or biliary dilatation. Pancreas: Unremarkable. No pancreatic ductal dilatation or surrounding inflammatory changes. Spleen: Normal in size without focal abnormality. Adrenals/Urinary Tract: Adrenal glands are unremarkable. Kidneys are normal, without renal calculi, focal lesion, or hydronephrosis. Bladder is unremarkable. Stomach/Bowel: The stomach is within normal limits. Mildly improved but continued mildly dilated loops of small bowel, with gradual transition to nondilated ileum. The distal and terminal ileum remain sharply decompressed. No bowel wall thickening or surrounding inflammatory changes. The colon is unremarkable. Normal appendix. Vascular/Lymphatic: Aortic atherosclerosis. No enlarged abdominal or pelvic lymph nodes. Reproductive: Prostate is unremarkable. Other: Unchanged small fat containing bilateral inguinal hernias. No free fluid or pneumoperitoneum. Musculoskeletal: No acute or significant osseous findings. IMPRESSION: 1. No intra-abdominal fluid collection. 2. Mildly improved but continued multiple mildly dilated loops of small bowel without discrete transition point identified. The distal and terminal ileum remain sharply decompressed. Findings could reflect ileus or ongoing partial small  bowel obstruction. 3. Aortic Atherosclerosis (ICD10-I70.0). Electronically Signed   By: WTitus DubinM.D.   On: 04/27/2022 15:50       LOS: 20 days   [facility] Pager on [email or web address]  04/29/2022, 10:18 AM

## 2022-04-29 NOTE — Consult Note (Signed)
WOC Nurse Consult Note: Reason for Consult:Dry, thick, brown/tan peeling skin on bilateral heels Wound type:N/A Pressure Injury POA: N/A Measurement:Measured and recorded on Nursing flow sheet by Bedside RN, B. Graves: Right: 3.5cm x 3cm, Left: 2cm x 3cm  Wound bed:N/A Drainage (amount, consistency, odor) None Periwound: intact Dressing procedure/placement/frequency: I have provided Nursing with guidance for care of this tissue using a soap and water cleanse once daily followed by a thorough rinse and pat dry. Topical care will be to moisturize with Sween 24, our house moisturizer and when absorbed, to apply clean, non-skid socks.  [place] nursing team will not follow, but will remain available to this patient, the nursing and medical teams.  Please re-consult if needed.  Thank you for inviting [place] to participate in this patient's Plan of Care.  Maudie Flakes, MSN, RN, CNS, [place], Serita Grammes, [employer], [employer] phone:  [phone]

## 2022-04-30 ENCOUNTER — Inpatient Hospital Stay (HOSPITAL_COMMUNITY): Payer: Medicaid Other

## 2022-04-30 DIAGNOSIS — R7989 Other specified abnormal findings of blood chemistry: Secondary | ICD-10-CM | POA: Diagnosis not present

## 2022-04-30 DIAGNOSIS — K56609 Unspecified intestinal obstruction, unspecified as to partial versus complete obstruction: Secondary | ICD-10-CM | POA: Diagnosis not present

## 2022-04-30 DIAGNOSIS — E1165 Type 2 diabetes mellitus with hyperglycemia: Secondary | ICD-10-CM | POA: Diagnosis not present

## 2022-04-30 LAB — GLUCOSE, CAPILLARY
Glucose-Capillary: 212 mg/dL — ABNORMAL HIGH (ref 70–99)
Glucose-Capillary: 126 mg/dL — ABNORMAL HIGH (ref 70–99)
Glucose-Capillary: 148 mg/dL — ABNORMAL HIGH (ref 70–99)
Glucose-Capillary: 113 mg/dL — ABNORMAL HIGH (ref 70–99)

## 2022-04-30 LAB — COMPREHENSIVE METABOLIC PANEL
ALT: 188 U/L — ABNORMAL HIGH (ref 0–44)
AST: 65 U/L — ABNORMAL HIGH (ref 15–41)
Albumin: 2.9 g/dL — ABNORMAL LOW (ref 3.5–5.0)
Alkaline Phosphatase: 130 U/L — ABNORMAL HIGH (ref 38–126)
Anion gap: 8 (ref 5–15)
BUN: 30 mg/dL — ABNORMAL HIGH (ref 8–23)
CO2: 23 mmol/L (ref 22–32)
Calcium: 8.9 mg/dL (ref 8.9–10.3)
Chloride: 103 mmol/L (ref 98–111)
Creatinine, Ser: 0.87 mg/dL (ref 0.61–1.24)
GFR, Estimated: >60 mL/min (ref >60)
Glucose, Bld: 204 mg/dL — ABNORMAL HIGH (ref 70–99)
Potassium: 4 mmol/L (ref 3.5–5.1)
Sodium: 134 mmol/L — ABNORMAL LOW (ref 135–145)
Total Bilirubin: 0.3 mg/dL (ref 0.3–1.2)
Total Protein: 6.9 g/dL (ref 6.5–8.1)

## 2022-04-30 LAB — CBC
HCT: 29.4 % — ABNORMAL LOW (ref 39.0–52.0)
Hemoglobin: 9.6 g/dL — ABNORMAL LOW (ref 13.0–17.0)
MCH: 34.3 pg — ABNORMAL HIGH (ref 26.0–34.0)
MCHC: 32.7 g/dL (ref 30.0–36.0)
MCV: 105 fL — ABNORMAL HIGH (ref 80.0–100.0)
Platelets: 331 10*3/uL (ref 150–400)
RBC: 2.8 MIL/uL — ABNORMAL LOW (ref 4.22–5.81)
RDW: 14.2 % (ref 11.5–15.5)
WBC: 12.5 10*3/uL — ABNORMAL HIGH (ref 4.0–10.5)
nRBC: 0.5 % — ABNORMAL HIGH (ref 0.0–0.2)

## 2022-04-30 LAB — MAGNESIUM: Magnesium: 2.1 mg/dL (ref 1.7–2.4)

## 2022-04-30 LAB — PHOSPHORUS: Phosphorus: 2.5 mg/dL (ref 2.5–4.6)

## 2022-04-30 LAB — TRIGLYCERIDES: Triglycerides: 312 mg/dL — ABNORMAL HIGH (ref <150)

## 2022-04-30 MED ORDER — PANTOPRAZOLE SODIUM 40 MG IV SOLR
40.0000 mg | INTRAVENOUS | Status: DC
  Administered 2022-04-30 – 2022-05-04 (×5): 40 mg via INTRAVENOUS
  Filled 2022-04-30 (×5): qty 10

## 2022-04-30 MED ORDER — INSULIN ASPART 100 UNIT/ML IJ SOLN: 0.0000 [IU] | Freq: Four times a day (QID) | INTRAMUSCULAR | Status: DC

## 2022-04-30 MED ORDER — INSULIN ASPART 100 UNIT/ML IJ SOLN
0.0000 [IU] | Freq: Four times a day (QID) | INTRAMUSCULAR | Status: DC
  Administered 2022-05-01: 5 [IU] via SUBCUTANEOUS
  Administered 2022-05-01: 2 [IU] via SUBCUTANEOUS
  Administered 2022-05-01: 5 [IU] via SUBCUTANEOUS
  Administered 2022-05-01 – 2022-05-02 (×2): 2 [IU] via SUBCUTANEOUS
  Administered 2022-05-02 (×2): 3 [IU] via SUBCUTANEOUS
  Administered 2022-05-02: 5 [IU] via SUBCUTANEOUS
  Administered 2022-05-03 (×2): 3 [IU] via SUBCUTANEOUS
  Administered 2022-05-03: 2 [IU] via SUBCUTANEOUS
  Administered 2022-05-03: 3 [IU] via SUBCUTANEOUS
  Administered 2022-05-04: 2 [IU] via SUBCUTANEOUS
  Administered 2022-05-04: 5 [IU] via SUBCUTANEOUS
  Administered 2022-05-04 (×2): 3 [IU] via SUBCUTANEOUS
  Administered 2022-05-05: 2 [IU] via SUBCUTANEOUS
  Administered 2022-05-05: 3 [IU] via SUBCUTANEOUS
  Administered 2022-05-06: 2 [IU] via SUBCUTANEOUS

## 2022-04-30 MED ORDER — TRAVASOL 10 % IV SOLN
INTRAVENOUS | Status: DC
  Filled 2022-04-30: qty 1296

## 2022-04-30 NOTE — Progress Notes (Signed)
NG Tube placed per order. Placement position checked at bedside by nurse. Position is pending confirmation by KUB. Patient is watching TV. No complaints of nausea or vomiting. Will continue to monitor

## 2022-04-30 NOTE — Progress Notes (Signed)
13 Days Post-Op   Subjective/Chief Complaint: Vomited 2 large amounts yesterday.  NGT replaced.  No BM for 3 days.   Objective: Vital signs in last 24 hours: Temp:  [97.8 F (36.6 C)-98.4 F (36.9 C)] 97.8 F (36.6 C) (09/18 0457) Pulse Rate:  [105-108] 108 (09/18 0457) Resp:  [16-17] 16 (09/18 0457) BP: (119-123)/(77-83) 122/77 (09/18 0457) SpO2:  [93 %] 93 % (09/17 2112) Weight:  [102 kg] 102 kg (09/18 0457) Last BM Date : 04/29/22  Intake/Output from previous day: 09/17 0701 - 09/18 0700 In: 3246.7 [P.O.:610; I.V.:2636.7] Out: 1050 [Urine:1050] Intake/Output this shift: Total I/O In: -  Out: 800 [Urine:800]  PE: GI: Distended, non-tender to palpation. Incisions with staples and small amount of dried serous discharge distal wound. Abd binder in place.  NGT in place with some bilious output.   Lab Results:  Recent Labs    04/27/22 1315 04/30/22 0518  WBC 15.3* 12.5*  HGB 10.5* 9.6*  HCT 31.5* 29.4*  PLT 334 331   BMET Recent Labs    04/28/22 0235 04/30/22 0518  NA 138 134*  K 4.0 4.0  CL 103 103  CO2 26 23  GLUCOSE 121* 204*  BUN 23 30*  CREATININE 0.74 0.87  CALCIUM 9.4 8.9   PT/INR No results for input(s): "LABPROT", "INR" in the last 72 hours. ABG No results for input(s): "PHART", "HCO3" in the last 72 hours.  Invalid input(s): "PCO2", "PO2"  Studies/Results: DG Abd 1 View  Result Date: 04/30/2022 CLINICAL DATA:  Nasogastric tube placement.  Vomiting. EXAM: ABDOMEN - 1 VIEW COMPARISON:  Radiographs 04/29/2022 and 04/24/2022.  CT 04/27/2022. FINDINGS: Tip of the enteric tube projects over the left subphrenic region consistent with position in the proximal stomach. Persistent diffuse bowel distension which involves the small bowel more than the colon, but most likely represents a postoperative ileus. Midline skin staples are noted. IMPRESSION: Nasogastric tube tip projects over the proximal stomach. Persistent bowel distension favoring a  postoperative ileus over a partial small bowel obstruction. Electronically Signed   By: Richardean Sale M.D.   On: 04/30/2022 08:32   DG ABD ACUTE 2+V W 1V CHEST  Result Date: 04/29/2022 CLINICAL DATA:  Nausea and vomiting. EXAM: DG ABDOMEN ACUTE WITH 1 VIEW CHEST COMPARISON:  Chest x-ray 04/09/2022. CT abdomen and pelvis 04/27/2022. FINDINGS: Dilated small bowel loops with air-fluid levels are again seen throughout the mid and upper abdomen. Small bowel loops measure up to 5.8 cm in diameter and have increased in size. Air seen throughout nondilated colon to the level the rectum. Midline skin staples are again noted. Lungs are clear. No pleural effusion or pneumothorax. Cardiomediastinal silhouette is within normal limits. Right PICC line tip projects over the cavoatrial junction. No acute fractures are seen. IMPRESSION: 1. Dilated small bowel loops have increased in size worrisome for worsening small-bowel obstruction versus ileus. 2. No acute cardiopulmonary process. Electronically Signed   By: Ronney Asters M.D.   On: 04/29/2022 22:27   DG Abd 2 Views  Result Date: 04/29/2022 CLINICAL DATA:  Postoperative ileus. EXAM: ABDOMEN - 2 VIEW COMPARISON:  CT abdomen pelvis, 04/27/2022. Abdominal radiographs, 04/24/2022. FINDINGS: Gas-filled loops of prominent small bowel are notably less dilated than on the CT from 04/27/2022. Residual contrast is noted in a normal caliber colon. Midline skin staples are stable. No free air or bowel air-fluid levels. IMPRESSION: 1. Significant improvement. Partly air-filled loops of prominent small bowel without overt small bowel dilation, significantly decreased in caliber compared to the  CT from 2 days ago. Electronically Signed   By: Amie Portland M.D.   On: 04/29/2022 10:55    Anti-infectives: Anti-infectives (From admission, onward)    Start     Dose/Rate Route Frequency Ordered Stop   04/17/22 1100  ceFAZolin (ANCEF) IVPB 2g/100 mL premix        2 g 200 mL/hr over  30 Minutes Intravenous On call to O.R. 04/17/22 1045 04/17/22 1150   04/09/22 1100  Ampicillin-Sulbactam (UNASYN) 3 g in sodium chloride 0.9 % 100 mL IVPB  Status:  Discontinued        3 g 200 mL/hr over 30 Minutes Intravenous Every 8 hours 04/09/22 1045 04/14/22 1419       Assessment/Plan: POD 13 s/p Exploratory laparotomy, lysis of adhesions for SBO by Dr. Luisa Hart, 04/17/22 - NGT replaced yesterday due to vomiting, despite films that aren't overwhelming for significant distention.  Suspect a component of gastric ileus given he is still having some bowel function from below. - continue abdominal binder - afebrile.  WBC down to 12.5.  CT scan on Friday with no intra-abdominal process or infection - Cont TPN  - Bx Mesentery of SB reassuring with benign dense fibrovascular stroma compatible with an adhesion - Mobilize for bowel function, PT. Currently recommending SNF. Plan is to return to [facility] at d/c.  - Pulm toilet  FEN - NPO/NGT, TPN, IVF per [facility]. VTE - SCDs; Lovenox (hgb stable) ID - None currently. Afebrile. WBC downtrending   - Per [facility] -  New dx DM (DKA on presentation) AKI - resolved PNA - completed abx  LOS: 21 days   Letha Cape, [facility] 04/30/2022, 11:19 AM Please see Amion for pager number during day hours 7:00am-4:30pm

## 2022-04-30 NOTE — Progress Notes (Signed)
Mobility Specialist - Progress Note   04/30/22 1506  Mobility  Activity Refused mobility   Pt was asleep and hard to arouse despite multiple efforts. Will follow up if time permits.    Larey Seat

## 2022-04-30 NOTE — TOC Progression Note (Signed)
Transition of Care [facility]) - Progression Note    Patient Details  Name: DAYMEN HASSEBROCK MRN: [id number] Date of Birth: 03-10-1961  Transition of Care [facility]) CM/SW [place], [place] Phone Number: 04/30/2022, 2:20 PM  Clinical Narrative:     Plan to return to [facility] when medically ready for dc. CSW will continue to follow and assist with patients dc planning needs  Expected Discharge Plan: [place] Barriers to Discharge: Continued Medical Work up  Expected Discharge Plan and Services Expected Discharge Plan: [place] In-house Referral: Clinical Social Work     Living arrangements for the past 2 months: [place]                                       Social Determinants of Health (SDOH) Interventions    Readmission Risk Interventions     No data to display

## 2022-04-30 NOTE — Inpatient Diabetes Management (Signed)
Inpatient Diabetes Program Recommendations  AACE/ADA: New Consensus Statement on Inpatient Glycemic Control  Target Ranges:  Prepandial:   less than 140 mg/dL      Peak postprandial:   less than 180 mg/dL (1-2 hours)      Critically ill patients:  140 - 180 mg/dL    Latest Reference Range & Units 04/30/22 02:41 04/30/22 08:33 04/30/22 12:14  Glucose-Capillary 70 - 99 mg/dL 212 (H) 126 (H) 148 (H)    Latest Reference Range & Units 04/29/22 08:03 04/29/22 11:35 04/29/22 16:47 04/29/22 21:10  Glucose-Capillary 70 - 99 mg/dL 164 (H) 137 (H) 121 (H) 204 (H)   Review of Glycemic Control  Current orders for Inpatient glycemic control:Novolog 0-15 units TID with meals; TPN @ 98-196 ml/hr with 65 units of insulin  Inpatient Diabetes Program Recommendations:    Insulin: Please consider changing Novolog to 0-15 units Q4H since patient is still NPO and ordered tube feedings.  Thanks, Barnie Alderman, RN, MSN, [place] Diabetes Coordinator Inpatient Diabetes Program [phone] (Team Pager from 8am to [place])

## 2022-04-30 NOTE — Progress Notes (Signed)
[facility] PROGRESS NOTE   Cody [name] LZJ:[id number] DOB: [birth year] DOA: 04/09/2022  PCP: Ladell Pier, MD  Brief History/Interval Summary:  61 y.o. male with a history of GERD, allergic rhinitis who presented 8/28 from [facility] with confusion found to be in DKA (new diagnosis of diabetes). Neuroimaging showed no stroke. the patient was also hypoxic, diagnosed with aspiration pneumonia and improved on antibiotics. DKA, AKI, encephalopathy all resolved.    He developed abdominal pain and distention, vomiting with SBO shown on CT. Surgery was consulted, performed ex lap with LOA by Dr. Brantley Stage on 9/5 with biopsy suggestive of adhesion.  Patient was started on TPN.  Consultants: General surgery.  Procedures: Exploratory laparotomy    Subjective/Interval History: Overnight events noted.  Patient had episodes of vomiting overnight.  X-ray suggested worsening in his small bowel obstruction versus ileus.  After discussions with general surgery NG tube was placed overnight.  Patient denies any complaints this morning.  Not very talkative.  Appears to be comfortable.      Assessment/Plan:  Small bowel obstruction/postoperative ileus S/p ex lap, LOA by Dr. Brantley Stage 9/5. Biopsy reassuring with benign dense fibrovascular stroma suggestive of adhesion.  Surgery is managing.  NG tube was pulled out on 9/14.  Started on clear liquids and was advanced to full liquids. Over the past 24 hours his abdominal distention has worsened.  He has had episodes of vomiting.  NG tube had to be placed again last night.  General surgery continues to follow. CT scan of the abdomen done on 9/15 did not show any abscess.  WBC has been high though appears to be better this morning.  He is afebrile.     T2DM newly diagnosed presenting with DKA, hyperglycemia A1c 11.7%. C-peptide low at 0.6, consider recheck/stim testing outside scope of DKA/acute illness.  Was on long-acting insulin.  However  due to small bowel obstruction and n.p.o. status this was discontinued.  He is getting insulin through his TPN.  He is also on SSI. Need to transition back to subcutaneous long-acting insulin once he is off of TPN. CBGs are reasonably well controlled.   Acute hypoxic respiratory failure/aspiration pneumonia/sepsis present on admission Initially related to aspiration pneumonia and sepsis. ?if now also due to atelectasis.  TTE with normal LVEF and G1 DD. Patient was diuresed.  Renal function is stable.   CT scan did not show any PE.  There were opacities suggestive of infiltrate versus edema. Incentive spirometry. Remains on Lasix once daily. Respiratory status is stable.  Saturating normal on room air.   Hyperlipidemia LDL 151.  Resume statin at discharge.   Acute kidney injury In the setting of DKA.  Resolved.   Acute metabolic encephalopathy/strokelike symptoms CVA work-up unrevealing.  EEG unremarkable. Resolved.   Abnormal LFTs Mildly elevated AST and ALT noted.  No abnormality in the hepatobiliary system on CT scan.  LFTs are stable for the most part.  Hepatitis panel is pending.   Acute blood loss anemia Drop in hemoglobin is likely dilutional.  No evidence of overt blood loss.   Sinus tachycardia Improved. No fevers, no PE on CTA. IV metoprolol as needed HR > 120.   GERD: Continue PPI   Mild hypernatremia/hypercalcemia Likely from dehydration.  Resolved.   Hypokalemia Resolved. Will continue monitoring with IV diuresis   Tobacco use disorder Encourage cessation. Continue nicotine patch   Generalized weakness/physical deconditioning PT/OT recommended return to SNF for further therapy   Obesity Estimated body mass index  is 31.36 kg/m as calculated from the following:   Height as of this encounter: '5\' 11"'  (1.803 m).   Weight as of this encounter: 102 kg.  DVT Prophylaxis: Lovenox Code Status: Full code Family Communication: Discussed with patient.  No family at  bedside Disposition Plan: Back to SNF when medically improved  Status is: Inpatient Remains inpatient appropriate because: Small bowel obstruction      Medications: Scheduled:  bisacodyl  10 mg Rectal Daily   Chlorhexidine Gluconate Cloth  6 each Topical Daily   enoxaparin (LOVENOX) injection  50 mg Subcutaneous Q24H   feeding supplement  237 mL Oral BID BM   furosemide  40 mg Intravenous Daily   insulin aspart  0-15 Units Subcutaneous TID WC   insulin starter kit- pen needles  1 kit Other Once   multivitamin  15 mL Oral Daily   pantoprazole  40 mg Oral QPM   sodium chloride flush  10-40 mL Intracatheter Q12H   Continuous:  sodium chloride 10 mL/hr at 04/30/22 0400   HFS:FSELTR chloride, acetaminophen, alum & mag hydroxide-simeth, dextrose, morphine injection, nicotine, ondansetron (ZOFRAN) IV, mouth rinse, oxyCODONE, phenol, sodium chloride flush  Antibiotics: Anti-infectives (From admission, onward)    Start     Dose/Rate Route Frequency Ordered Stop   04/17/22 1100  ceFAZolin (ANCEF) IVPB 2g/100 mL premix        2 g 200 mL/hr over 30 Minutes Intravenous On call to O.R. 04/17/22 1045 04/17/22 1150   04/09/22 1100  Ampicillin-Sulbactam (UNASYN) 3 g in sodium chloride 0.9 % 100 mL IVPB  Status:  Discontinued        3 g 200 mL/hr over 30 Minutes Intravenous Every 8 hours 04/09/22 1045 04/14/22 1419       Objective:  Vital Signs  Vitals:   04/29/22 0942 04/29/22 1721 04/29/22 2112 04/30/22 0457  BP: 105/69 123/83 119/82 122/77  Pulse: 94 (!) 105 (!) 106 (!) 108  Resp: '18 17 16 16  ' Temp: 98.3 F (36.8 C) 98.4 F (36.9 C) 98.3 F (36.8 C) 97.8 F (36.6 C)  TempSrc: Oral Oral Oral Oral  SpO2: 94%  93%   Weight:    102 kg  Height:        Intake/Output Summary (Last 24 hours) at 04/30/2022 0939 Last data filed at 04/30/2022 0840 Gross per 24 hour  Intake 3246.73 ml  Output 1850 ml  Net 1396.73 ml    Filed Weights   04/28/22 0500 04/29/22 0601 04/30/22 0457   Weight: 101.1 kg 100.8 kg 102 kg    General appearance: Awake alert.  In no distress Resp: Clear to auscultation bilaterally.  Normal effort Cardio: S1-S2 is normal regular.  No S3-S4.  No rubs murmurs or bruit GI: Abdomen noted to be distended.  Not very tender.  Bowel sounds high-pitched. Extremities: No edema.  Full range of motion of lower extremities. Neurologic: No focal neurological deficits.     Lab Results:  Data Reviewed: I have personally reviewed following labs and reports of the imaging studies  CBC: Recent Labs  Lab 04/24/22 0415 04/26/22 0531 04/27/22 1315 04/30/22 0518  WBC 16.4* 16.4* 15.3* 12.5*  HGB 9.3* 10.2* 10.5* 9.6*  HCT 28.1* 30.4* 31.5* 29.4*  MCV 103.7* 105.2* 104.3* 105.0*  PLT 295 313 334 331     Basic Metabolic Panel: Recent Labs  Lab 04/25/22 0500 04/26/22 0531 04/27/22 0344 04/28/22 0235 04/30/22 0518  NA 141 145 141 138 134*  K 3.9 4.1 3.9 4.0 4.0  CL 109 106 106 103 103  CO2 '26 26 25 26 23  ' GLUCOSE 166* 179* 164* 121* 204*  BUN 25* 27* 27* 23 30*  CREATININE 0.83 0.78 0.82 0.74 0.87  CALCIUM 9.2 9.5 9.2 9.4 8.9  MG  --  2.5* 2.4 2.1 2.1  PHOS  --  4.1 3.6 3.4 2.5     GFR: Estimated Creatinine Clearance: 107.1 mL/min (by C-G formula based on SCr of 0.87 mg/dL).  Liver Function Tests: Recent Labs  Lab 04/26/22 0531 04/30/22 0518  AST 81* 65*  ALT 164* 188*  ALKPHOS 130* 130*  BILITOT 0.8 0.3  PROT 7.3 6.9  ALBUMIN 3.1* 2.9*      CBG: Recent Labs  Lab 04/29/22 1135 04/29/22 1647 04/29/22 2110 04/30/22 0241 04/30/22 0833  GLUCAP 137* 121* 204* 212* 126*     Lipid Profile: Recent Labs    04/30/22 0518  TRIG 312*      Radiology Studies: DG Abd 1 View  Result Date: 04/30/2022 CLINICAL DATA:  Nasogastric tube placement.  Vomiting. EXAM: ABDOMEN - 1 VIEW COMPARISON:  Radiographs 04/29/2022 and 04/24/2022.  CT 04/27/2022. FINDINGS: Tip of the enteric tube projects over the left subphrenic region  consistent with position in the proximal stomach. Persistent diffuse bowel distension which involves the small bowel more than the colon, but most likely represents a postoperative ileus. Midline skin staples are noted. IMPRESSION: Nasogastric tube tip projects over the proximal stomach. Persistent bowel distension favoring a postoperative ileus over a partial small bowel obstruction. Electronically Signed   By: Richardean Sale M.D.   On: 04/30/2022 08:32   DG ABD ACUTE 2+V W 1V CHEST  Result Date: 04/29/2022 CLINICAL DATA:  Nausea and vomiting. EXAM: DG ABDOMEN ACUTE WITH 1 VIEW CHEST COMPARISON:  Chest x-ray 04/09/2022. CT abdomen and pelvis 04/27/2022. FINDINGS: Dilated small bowel loops with air-fluid levels are again seen throughout the mid and upper abdomen. Small bowel loops measure up to 5.8 cm in diameter and have increased in size. Air seen throughout nondilated colon to the level the rectum. Midline skin staples are again noted. Lungs are clear. No pleural effusion or pneumothorax. Cardiomediastinal silhouette is within normal limits. Right PICC line tip projects over the cavoatrial junction. No acute fractures are seen. IMPRESSION: 1. Dilated small bowel loops have increased in size worrisome for worsening small-bowel obstruction versus ileus. 2. No acute cardiopulmonary process. Electronically Signed   By: Ronney Asters M.D.   On: 04/29/2022 22:27   DG Abd 2 Views  Result Date: 04/29/2022 CLINICAL DATA:  Postoperative ileus. EXAM: ABDOMEN - 2 VIEW COMPARISON:  CT abdomen pelvis, 04/27/2022. Abdominal radiographs, 04/24/2022. FINDINGS: Gas-filled loops of prominent small bowel are notably less dilated than on the CT from 04/27/2022. Residual contrast is noted in a normal caliber colon. Midline skin staples are stable. No free air or bowel air-fluid levels. IMPRESSION: 1. Significant improvement. Partly air-filled loops of prominent small bowel without overt small bowel dilation, significantly  decreased in caliber compared to the CT from 2 days ago. Electronically Signed   By: Lajean Manes M.D.   On: 04/29/2022 10:55       LOS: 21 days   [place] [employer] on [email or web address]  04/30/2022, 9:39 AM

## 2022-04-30 NOTE — Progress Notes (Signed)
PHARMACY - TOTAL PARENTERAL NUTRITION CONSULT NOTE   Indication: Small bowel obstruction/prolonged ileus  Patient Measurements: Height: 5\' 11"  (180.3 cm) Weight: 102 kg (224 lb 13.9 oz) IBW/kg (Calculated) : 75.3 TPN AdjBW (KG): 82.3 Body mass index is 31.36 kg/m. Usual Weight: 100-103 kg  Assessment:  61 yo M with newly diagnosed type 2 diabetes. Patient developed ileus 9/1 which then evolved into SBO now s/p ex lap 04/17/22. Pt was eating ~50% of dysphagia/carb diet from 8/28-9/1. NPO since 04/14/22. Anticipate prolonged ileus - pharmacy consulted for TPN.   Overnight 9/17, patient reported to have had 2 episodes of large-volume emesis. Stat XR of abd showed worsening of dilated small bowels concerning for developing obstruction versus ileus. Patient placed back on NPO with NG tube on suction.   Glucose / Insulin: A1c 11.7%, BG 126-212; used 5u SSI in 24h (PM dose skipped) Electrolytes: (last labs 9/18) CoCa 9.8, others WNL Renal: Scr 0.87 ; BUN wnl Hepatic: AST/ALT 65/188 (decreased); TGs 337 > 312, albumin 2.9 Intake / Output; MIVF: UOP 0.6 mL/kg/hr (Lasix 40 IV daily), emesis x2, LBM 9/17  GI Imaging: 9/3 CT AP: c/f partial or developing SBO  9/4 Ab Xray: c/f distal obstruction or ileus 9/8 Ab Xray: multiple dilated SB compatible with post-op ileus 9/12 Ab Xray: Mild ileus unchanged 9/15 CT: mildly improved ileus vs ongoing partial SBO; No intra-abdominal fluid collection GI Surgeries / Procedures:  9/5 Ex lap, lysis of adhesions (no abscess, no diverticulitis)  9/5 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 9/14 NGT removed   Central access: PICC 04/17/22 TPN start date: 04/18/22  Nutritional Goals: Goal TPN rate is 90 mL/hr (provides 129 g of protein, 73g lipid, 296g dextrose, and 2257 kcals per day)  RD Assessment: Estimated Needs Total Energy Estimated Needs: 2200-2400 Total Protein Estimated Needs: 110-125 grams Total Fluid  Estimated Needs: >/= 2 L  Current Nutrition:  TPN 9/14 CLD 9/15 FLD- ate some soup, no ensure; No TPN given d/t compounder broken down  9/16 FLD- had 1 ensure, 1 soup, 1 pudding; had emesis x1 9/17 FLD- working on ensure 9/18 back to NPO following large emesis episode overnight   Plan:  Cycle goal TPN over 12 hours (98-196 mL/hr, 2.2-4.5 mg/kg/min), provides 130 g protein, 2202 kcal, providing 100% of needs Electrolytes in TPN: Continue Na 75 mEq/L,  K 20 mEq/L, Ca 0 mEq/L, Mg 4 mEq/L, Phos 5 mmol/L; Cl:Ac 1:1 Change standard MVI to PO, removed from TPN  Change mSSI to Q6h while NPO and adjust as needed Increase back to regular insulin 65 units in TPN  Monitor TPN labs Mon/Thurs, and as indicated F/u PO intake, wean TPN as able    Thank you for allowing pharmacy to be a part of this patient's care.  Ardyth Harps, PharmD Clinical Pharmacist

## 2022-05-01 DIAGNOSIS — K56609 Unspecified intestinal obstruction, unspecified as to partial versus complete obstruction: Secondary | ICD-10-CM | POA: Diagnosis not present

## 2022-05-01 DIAGNOSIS — E1165 Type 2 diabetes mellitus with hyperglycemia: Secondary | ICD-10-CM | POA: Diagnosis not present

## 2022-05-01 DIAGNOSIS — R7989 Other specified abnormal findings of blood chemistry: Secondary | ICD-10-CM | POA: Diagnosis not present

## 2022-05-01 LAB — CBC
HCT: 30.9 % — ABNORMAL LOW (ref 39.0–52.0)
Hemoglobin: 9.9 g/dL — ABNORMAL LOW (ref 13.0–17.0)
MCH: 34.1 pg — ABNORMAL HIGH (ref 26.0–34.0)
MCHC: 32 g/dL (ref 30.0–36.0)
MCV: 106.6 fL — ABNORMAL HIGH (ref 80.0–100.0)
Platelets: 335 10*3/uL (ref 150–400)
RBC: 2.9 MIL/uL — ABNORMAL LOW (ref 4.22–5.81)
RDW: 14.7 % (ref 11.5–15.5)
WBC: 12 10*3/uL — ABNORMAL HIGH (ref 4.0–10.5)
nRBC: 0.6 % — ABNORMAL HIGH (ref 0.0–0.2)

## 2022-05-01 LAB — GLUCOSE, CAPILLARY
Glucose-Capillary: 119 mg/dL — ABNORMAL HIGH (ref 70–99)
Glucose-Capillary: 125 mg/dL — ABNORMAL HIGH (ref 70–99)
Glucose-Capillary: 140 mg/dL — ABNORMAL HIGH (ref 70–99)
Glucose-Capillary: 217 mg/dL — ABNORMAL HIGH (ref 70–99)
Glucose-Capillary: 223 mg/dL — ABNORMAL HIGH (ref 70–99)

## 2022-05-01 LAB — BASIC METABOLIC PANEL
Anion gap: 8 (ref 5–15)
BUN: 27 mg/dL — ABNORMAL HIGH (ref 8–23)
CO2: 23 mmol/L (ref 22–32)
Calcium: 8.6 mg/dL — ABNORMAL LOW (ref 8.9–10.3)
Chloride: 106 mmol/L (ref 98–111)
Creatinine, Ser: 0.83 mg/dL (ref 0.61–1.24)
GFR, Estimated: >60 mL/min (ref >60)
Glucose, Bld: 292 mg/dL — ABNORMAL HIGH (ref 70–99)
Potassium: 3.8 mmol/L (ref 3.5–5.1)
Sodium: 137 mmol/L (ref 135–145)

## 2022-05-01 LAB — HEPATITIS PANEL, ACUTE
HCV Ab: NONREACTIVE
Hep A IgM: NONREACTIVE
Hep B C IgM: NONREACTIVE
Hepatitis B Surface Ag: NONREACTIVE

## 2022-05-01 MED ORDER — ADULT MULTIVITAMIN W/MINERALS CH
1.0000 | ORAL_TABLET | Freq: Every day | ORAL | Status: DC
  Administered 2022-05-03 – 2022-05-06 (×4): 1 via ORAL
  Filled 2022-05-01 (×4): qty 1

## 2022-05-01 MED ORDER — METOCLOPRAMIDE HCL 5 MG/ML IJ SOLN
5.0000 mg | Freq: Three times a day (TID) | INTRAMUSCULAR | Status: AC
  Administered 2022-05-01 – 2022-05-04 (×9): 5 mg via INTRAVENOUS
  Filled 2022-05-01 (×9): qty 2

## 2022-05-01 MED ORDER — BOOST / RESOURCE BREEZE PO LIQD CUSTOM
1.0000 | Freq: Three times a day (TID) | ORAL | Status: DC
  Administered 2022-05-02 – 2022-05-04 (×5): 1 via ORAL

## 2022-05-01 MED ORDER — TRAVASOL 10 % IV SOLN
INTRAVENOUS | Status: AC
  Filled 2022-05-01: qty 1296

## 2022-05-01 NOTE — Progress Notes (Signed)
Nutrition Follow-up  DOCUMENTATION CODES:   Not applicable  INTERVENTION:   Discontinue Ensure Enlive, replace with Boost Breeze po TID, each supplement provides 250 kcal and 9 grams of protein Continue TPN, management per pharmacy Advance diet as medically appropriate Multivitamin w/ minerals daily  NUTRITION DIAGNOSIS:   Increased nutrient needs related to post-op healing as evidenced by estimated needs. - Ongoing  GOAL:   Patient will meet greater than or equal to 90% of their needs - Being addressed via TPN  MONITOR:   PO intake, Supplement acceptance, Diet advancement, Labs, I & O's  REASON FOR ASSESSMENT:   Consult New TPN/TNA  ASSESSMENT:   61 y.o. male presented to the ED with AMS from assisted living facility. PMH includes GERD. Pt admitted with DKA (new diagnosed DM) and AKI. Later developed an ileus vs SBO, requiring surgery.   9/03 - NGT placed to LIWS 9/05 - Ex lap s/p lysis of adhesions; NGT replaced to LIWS 9/06 - TPN started  9/14 - diet advanced to clear liquids; NGT removed 9/15 - diet advanced to full liquids  9/18 - NPO; NGT placed to LIWS 9/19 - diet advanced to clear liquids  Pt resting in bed. Reports that he was currently having a bowel movement, denies any nausea or vomiting. States that he has tried clears thus far today and had no symptoms. Discussed changing supplement to match current diet order.   Pt had two episodes of vomiting that resulted in replacement of NGT and diet downgrade.  Medications reviewed and include: Doculax, Lasix, NovoLog, Reglan, MVI, Protonix Labs reviewed: BUN 27, 24 hr CBG 113-223  NGT output: 1000 mL x 24 hours UOP: 3100 mL x 24 hours  Diet Order:   Diet Order             Diet clear liquid Room service appropriate? Yes; Fluid consistency: Thin  Diet effective now                   EDUCATION NEEDS:   No education needs have been identified at this time  Skin:  Skin Assessment: Skin Integrity  Issues: Skin Integrity Issues:: Incisions Incisions: Abdomen  Last BM:  9/18 - Type 6  Height:   Ht Readings from Last 1 Encounters:  04/09/22 5\' 11"  (1.803 m)    Weight:   Wt Readings from Last 1 Encounters:  05/01/22 98.3 kg   BMI:  Body mass index is 30.23 kg/m.  Estimated Nutritional Needs:  Kcal:  2200-2400 Protein:  110-125 grams Fluid:  >/= 2 L   Hermina Barters RD, LDN Clinical Dietitian See [facility] for contact information.

## 2022-05-01 NOTE — Progress Notes (Signed)
14 Days Post-Op   Subjective/Chief Complaint: + BM yesterday.  Minimal NGT output.  No complaints otherwise.  Sitting up in his chair   Objective: Vital signs in last 24 hours: Temp:  [98 F (36.7 C)-98.7 F (37.1 C)] 98.5 F (36.9 C) (09/19 0745) Pulse Rate:  [99-104] 104 (09/19 0745) Resp:  [16-17] 16 (09/19 0745) BP: (117-132)/(77-86) 119/77 (09/19 0745) SpO2:  [92 %-95 %] 95 % (09/19 0745) Weight:  [98.3 kg] 98.3 kg (09/19 0344) Last BM Date : 04/30/22  Intake/Output from previous day: 09/18 0701 - 09/19 0700 In: 2233.7 [P.O.:120; I.V.:2113.7] Out: 3100 [Urine:3100] Intake/Output this shift: No intake/output data recorded.  PE: GI: softer today.  NGT with essentially no output, midline incisions with staples, c/d/i   Lab Results:  Recent Labs    04/30/22 0518 05/01/22 0549  WBC 12.5* 12.0*  HGB 9.6* 9.9*  HCT 29.4* 30.9*  PLT 331 335   BMET Recent Labs    04/30/22 0518 05/01/22 0549  NA 134* 137  K 4.0 3.8  CL 103 106  CO2 23 23  GLUCOSE 204* 292*  BUN 30* 27*  CREATININE 0.87 0.83  CALCIUM 8.9 8.6*   PT/INR No results for input(s): "LABPROT", "INR" in the last 72 hours. ABG No results for input(s): "PHART", "HCO3" in the last 72 hours.  Invalid input(s): "PCO2", "PO2"  Studies/Results: DG Abd 1 View  Result Date: 04/30/2022 CLINICAL DATA:  Nasogastric tube placement.  Vomiting. EXAM: ABDOMEN - 1 VIEW COMPARISON:  Radiographs 04/29/2022 and 04/24/2022.  CT 04/27/2022. FINDINGS: Tip of the enteric tube projects over the left subphrenic region consistent with position in the proximal stomach. Persistent diffuse bowel distension which involves the small bowel more than the colon, but most likely represents a postoperative ileus. Midline skin staples are noted. IMPRESSION: Nasogastric tube tip projects over the proximal stomach. Persistent bowel distension favoring a postoperative ileus over a partial small bowel obstruction. Electronically Signed   By:  Richardean Sale M.D.   On: 04/30/2022 08:32   DG ABD ACUTE 2+V W 1V CHEST  Result Date: 04/29/2022 CLINICAL DATA:  Nausea and vomiting. EXAM: DG ABDOMEN ACUTE WITH 1 VIEW CHEST COMPARISON:  Chest x-ray 04/09/2022. CT abdomen and pelvis 04/27/2022. FINDINGS: Dilated small bowel loops with air-fluid levels are again seen throughout the mid and upper abdomen. Small bowel loops measure up to 5.8 cm in diameter and have increased in size. Air seen throughout nondilated colon to the level the rectum. Midline skin staples are again noted. Lungs are clear. No pleural effusion or pneumothorax. Cardiomediastinal silhouette is within normal limits. Right PICC line tip projects over the cavoatrial junction. No acute fractures are seen. IMPRESSION: 1. Dilated small bowel loops have increased in size worrisome for worsening small-bowel obstruction versus ileus. 2. No acute cardiopulmonary process. Electronically Signed   By: Ronney Asters M.D.   On: 04/29/2022 22:27   DG Abd 2 Views  Result Date: 04/29/2022 CLINICAL DATA:  Postoperative ileus. EXAM: ABDOMEN - 2 VIEW COMPARISON:  CT abdomen pelvis, 04/27/2022. Abdominal radiographs, 04/24/2022. FINDINGS: Gas-filled loops of prominent small bowel are notably less dilated than on the CT from 04/27/2022. Residual contrast is noted in a normal caliber colon. Midline skin staples are stable. No free air or bowel air-fluid levels. IMPRESSION: 1. Significant improvement. Partly air-filled loops of prominent small bowel without overt small bowel dilation, significantly decreased in caliber compared to the CT from 2 days ago. Electronically Signed   By: Dedra Skeens.D.  On: 04/29/2022 10:55    Anti-infectives: Anti-infectives (From admission, onward)    Start     Dose/Rate Route Frequency Ordered Stop   04/17/22 1100  ceFAZolin (ANCEF) IVPB 2g/100 mL premix        2 g 200 mL/hr over 30 Minutes Intravenous On call to O.R. 04/17/22 1045 04/17/22 1150   04/09/22 1100   Ampicillin-Sulbactam (UNASYN) 3 g in sodium chloride 0.9 % 100 mL IVPB  Status:  Discontinued        3 g 200 mL/hr over 30 Minutes Intravenous Every 8 hours 04/09/22 1045 04/14/22 1419       Assessment/Plan: POD 14 s/p Exploratory laparotomy, lysis of adhesions for SBO by Dr. Luisa Hart, 04/17/22 - minimal NGT output, + bowel function. -DC NGT and start low dose reglan 5mg  q 8 hrs -CLD - continue abdominal binder - afebrile.  WBC down to 12.0.  CT scan on Friday with no intra-abdominal process or infection - Cont TPN  - Bx Mesentery of SB reassuring with benign dense fibrovascular stroma compatible with an adhesion - Mobilize for bowel function, PT. Currently recommending SNF. Plan is to return to [facility] at d/c.  - Pulm toilet  FEN - CLD, TPN, IVF per TR[facility]. VTE - SCDs; Lovenox (hgb stable) ID - None currently. Afebrile. WBC downtrending   - Per [facility] -  New dx DM (DKA on presentation) AKI - resolved PNA - completed abx  LOS: 22 days   [facility], [facility] 05/01/2022, 9:51 AM Please see Amion for pager number during day hours 7:00am-4:30pm

## 2022-05-01 NOTE — Progress Notes (Addendum)
PHARMACY - TOTAL PARENTERAL NUTRITION CONSULT NOTE   Indication: Small bowel obstruction/prolonged ileus  Patient Measurements: Height: 5\' 11"  (180.3 cm) Weight: 98.3 kg (216 lb 11.4 oz) IBW/kg (Calculated) : 75.3 TPN AdjBW (KG): 82.3 Body mass index is 30.23 kg/m. Usual Weight: 100-103 kg  Assessment:  61 yo M with newly diagnosed type 2 diabetes. Patient developed ileus 9/1 which then evolved into SBO now s/p ex lap 04/17/22. Pt was eating ~50% of dysphagia/carb diet from 8/28-9/1. NPO since 04/14/22. Anticipate prolonged ileus - pharmacy consulted for TPN.   Overnight 9/17, patient reported to have had 2 episodes of large-volume emesis. Stat XR of abd showed worsening of dilated small bowels concerning for developing obstruction versus ileus. Patient placed back on NPO with NG tube on suction.    Glucose / Insulin: A1c 11.7%, BG 119-223; used 14u SSI in 24h Electrolytes: (last labs 9/18) CoCa 9.8, others WNL Renal: Scr 0.87 ; BUN wnl Hepatic: AST/ALT 65/188 (decreased); TGs 337 > 312, albumin 2.9 Intake / Output; MIVF: UOP 1.06 mL/kg/hr (Lasix 40 IV daily), large BM 9/18  GI Imaging: 9/03 CT AP: c/f partial or developing SBO  9/04 Ab Xray: c/f distal obstruction or ileus 9/08 Ab Xray: multiple dilated SB compatible with post-op ileus 9/12 Ab Xray: Mild ileus unchanged 9/15 CT: mildly improved ileus vs ongoing partial SBO; No intra-abdominal fluid collection GI Surgeries / Procedures:  9/05 Ex lap, lysis of adhesions (no abscess, no diverticulitis)  9/05 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 9/14 NGT removed   Central access: PICC 04/17/22 TPN start date: 04/18/22  Nutritional Goals: Goal TPN rate is 90 mL/hr (provides 129 g of protein, 73g lipid, 296g dextrose, and 2257 kcals per day)  RD Assessment: Estimated Needs Total Energy Estimated Needs: 2200-2400 Total Protein Estimated Needs: 110-125 grams Total Fluid Estimated Needs:  >/= 2 L  Current Nutrition:  TPN 9/14 CLD 9/15 FLD- ate some soup, no ensure; No TPN given d/t compounder broken down  9/16 FLD- had 1 ensure, 1 soup, 1 pudding; had emesis x1 9/17 FLD- working on ensure 9/18 back to NPO following large emesis episode overnight 9/19 CLD    Plan:  Change TPN to cycle over 14 hours (83 - 166 mL/hr, GIR 1.94-3.88 mg/kg/min), provides 130 g protein, 2202 kcal, providing 100% of needs Electrolytes in TPN: Continue Na 75 mEq/L,  K 20 mEq/L, Ca 0 mEq/L, Mg 4 mEq/L, Phos 5 mmol/L; Cl:Ac 1:1 Continue standard MVI to PO, removed from TPN  Continue mSSI to Q6h while NPO and adjust as needed Continue regular insulin to 65 units in TPN  Monitor TPN labs Mon/Thurs, and as indicated F/u PO intake, wean TPN as able   Thank you for allowing pharmacy to be a part of this patient's care.  Ardyth Harps, PharmD Clinical Pharmacist

## 2022-05-01 NOTE — TOC Progression Note (Signed)
Transition of Care [facility]) - Progression Note    Patient Details  Name: Cody [name] MRN: [id number] Date of Birth: Jun 19, 1961  Transition of Care [facility]) CM/SW [place], [place] Phone Number: 05/01/2022, 2:19 PM  Clinical Narrative:     Patient has SNF bed at [facility] when medically ready. Patient currently on TPN and has NG tube. CSW will continue to follow and assist with patients dc planning needs.  Expected Discharge Plan: [place] Barriers to Discharge: Continued Medical Work up  Expected Discharge Plan and Services Expected Discharge Plan: [place] Referral: Clinical Social Work     Living arrangements for the past 2 months: [place]                                       Social Determinants of Health (SDOH) Interventions    Readmission Risk Interventions     No data to display

## 2022-05-01 NOTE — Progress Notes (Signed)
Physical Therapy Treatment Patient Details Name: Cody [name] MRN: [id number] DOB: August 20, 1960 Today's Date: 05/01/2022   History of Present Illness 61 y.o. male presents to [facility] hospital on 04/09/2022 with AMS, found to be in DKA with newly diagnosed type 1 diabetes. Pt developed severe sepsis 2/2 with acute hypoxic respiratory failure 2/2 aspiration PNA, as well as ileus during admission. Pt underwent ex-lap with lysis of adhesions on 04/17/2022. PMH includes GERD, OA.    PT Comments    Pt making slow, steady progress. Encourage pt to amb at every opportunity (pt refused mobility specialist yesterday) to improve mobility and GI function. Continue to recommend return to SNF at DC.    Recommendations for follow up therapy are one component of a multi-disciplinary discharge planning process, led by the attending physician.  Recommendations may be updated based on patient status, additional functional criteria and insurance authorization.  Follow Up Recommendations  Skilled nursing-short term rehab (<3 hours/day) Can patient physically be transported by private vehicle: No   Assistance Recommended at Discharge Frequent or constant Supervision/Assistance  Patient can return home with the following A lot of help with bathing/dressing/bathroom;Help with stairs or ramp for entrance;Assist for transportation;Assistance with cooking/housework;A little help with walking and/or transfers   Equipment Recommendations  None recommended by PT    Recommendations for Other Services       Precautions / Restrictions Precautions Precautions: Fall Restrictions Weight Bearing Restrictions: No     Mobility  Bed Mobility Overal bed mobility: Needs Assistance Bed Mobility: Sit to Supine       Sit to supine: Min assist   General bed mobility comments: Assist to bring legs up into the bed    Transfers Overall transfer level: Needs assistance Equipment used: Rolling walker (2 wheels) Transfers: Sit  to/from Stand Sit to Stand: Min assist           General transfer comment: Assist to bring hips up    Ambulation/Gait Ambulation/Gait assistance: Min guard Gait Distance (Feet): 200 Feet Assistive device: Rolling walker (2 wheels) Gait Pattern/deviations: Decreased step length - right, Decreased step length - left, Trunk flexed, Step-through pattern, Decreased stride length Gait velocity: decr Gait velocity interpretation: 1.31 - 2.62 ft/sec, indicative of limited community ambulator   General Gait Details: Assist for safety and lines. Verbal cues to stay closer to walker and stand more erect   Stairs             Wheelchair Mobility    Modified Rankin (Stroke Patients Only)       Balance Overall balance assessment: Needs assistance Sitting-balance support: No upper extremity supported, Feet supported Sitting balance-Leahy Scale: Fair     Standing balance support: Bilateral upper extremity supported, Reliant on assistive device for balance Standing balance-Leahy Scale: Poor Standing balance comment: walker and min guard for static standing                            Cognition Arousal/Alertness: Awake/alert Behavior During Therapy: Flat affect Overall Cognitive Status: Impaired/Different from baseline Area of Impairment: Problem solving, Memory                     Memory: Decreased short-term memory       Problem Solving: Slow processing, Requires verbal cues          Exercises      General Comments General comments (skin integrity, edema, etc.): VSS on RA  Pertinent Vitals/Pain Pain Assessment Pain Assessment: Faces Faces Pain Scale: Hurts little more Pain Location: rt foot Pain Descriptors / Indicators: Grimacing, Sore Pain Intervention(s): Limited activity within patient's tolerance, Monitored during session    Home Living                          Prior Function            PT Goals (current goals  can now be found in the care plan section) Acute Rehab PT Goals Patient Stated Goal: not stated PT Goal Formulation: With patient Time For Goal Achievement: 05/15/22 Potential to Achieve Goals: Good Progress towards PT goals: Goals met and updated - see care plan    Frequency    Min 2X/week      PT Plan Current plan remains appropriate    Co-evaluation              AM-PAC PT "6 Clicks" Mobility   Outcome Measure  Help needed turning from your back to your side while in a flat bed without using bedrails?: A Little Help needed moving from lying on your back to sitting on the side of a flat bed without using bedrails?: A Little Help needed moving to and from a bed to a chair (including a wheelchair)?: A Little Help needed standing up from a chair using your arms (e.g., wheelchair or bedside chair)?: A Little Help needed to walk in hospital room?: A Little Help needed climbing 3-5 steps with a railing? : Total 6 Click Score: 16    End of Session Equipment Utilized During Treatment: Gait belt Activity Tolerance: Patient tolerated treatment well Patient left: with call bell/phone within reach;in bed;with bed alarm set Nurse Communication: Mobility status PT Visit Diagnosis: Unsteadiness on feet (R26.81);Muscle weakness (generalized) (M62.81)     Time: 0063-4949 PT Time Calculation (min) (ACUTE ONLY): 21 min  Charges:  $Gait Training: 8-22 mins                     [place] [facility] [place] 05/01/2022, 1:42 PM

## 2022-05-01 NOTE — Progress Notes (Signed)
 [facility] PROGRESS NOTE   Cody [name] MRN:[id number] DOB: 07/30/1961 DOA: 04/09/2022  PCP: Johnson, Deborah B, MD  Brief History/Interval Summary:  61 y.o. male with a history of GERD, allergic rhinitis who presented 8/28 from [facility] with confusion found to be in DKA (new diagnosis of diabetes). Neuroimaging showed no stroke. the patient was also hypoxic, diagnosed with aspiration pneumonia and improved on antibiotics. DKA, AKI, encephalopathy all resolved.    He developed abdominal pain and distention, vomiting with SBO shown on CT. Surgery was consulted, performed ex lap with LOA by Dr. Cornett on 9/5 with biopsy suggestive of adhesion.  Patient was started on TPN.  Consultants: General surgery.  Procedures: Exploratory laparotomy    Subjective/Interval History: Patient denies any nausea vomiting.  Had a large bowel movement yesterday.  No abdominal pain this morning.  Did not walk yesterday.  Discussed with nursing staff.    Assessment/Plan:  Small bowel obstruction/postoperative ileus S/p ex lap, LOA by Dr. Cornett 9/5. Biopsy reassuring with benign dense fibrovascular stroma suggestive of adhesion.  Surgery is managing.  NG tube was pulled out on 9/14.  Started on clear liquids and was advanced to full liquids. Patient had worsening abdominal distention over the last 48 hours and he had episodes of vomiting.  NG tube had to be inserted. It appears that he has had a bowel movement.  Could be signs of recovery.  Continue to monitor. CT scan of the abdomen done on 9/15 did not show any abscess.    T2DM newly diagnosed presenting with DKA, hyperglycemia A1c 11.7%. C-peptide low at 0.6, consider recheck/stim testing outside scope of DKA/acute illness.  Was on long-acting insulin.  However due to small bowel obstruction and n.p.o. status this was discontinued.  He is getting insulin through his TPN.  He is also on SSI. Need to transition back to subcutaneous  long-acting insulin once he is off of TPN. CBGs are reasonably well controlled.   Acute hypoxic respiratory failure/aspiration pneumonia/sepsis present on admission Initially related to aspiration pneumonia and sepsis. ?if now also due to atelectasis.  TTE with normal LVEF and G1 DD. Patient was diuresed.  Renal function is stable.   CT scan did not show any PE.  There were opacities suggestive of infiltrate versus edema. Incentive spirometry. Remains on Lasix once daily. Respiratory status is stable.  Saturating normal on room air.   Hyperlipidemia LDL 151.  Resume statin at discharge.   Acute kidney injury In the setting of DKA.  Resolved.   Acute metabolic encephalopathy/strokelike symptoms CVA work-up unrevealing.  EEG unremarkable. Resolved.   Abnormal LFTs Mildly elevated AST and ALT noted.  No abnormality in the hepatobiliary system on CT scan.  LFTs are stable for the most part.  Hepatitis panel is pending.   Acute blood loss anemia Drop in hemoglobin is likely dilutional.  No evidence of overt blood loss.   Sinus tachycardia Improved. No fevers, no PE on CTA. IV metoprolol as needed HR > 120.   GERD: Continue PPI   Mild hypernatremia/hypercalcemia Likely from dehydration.  Resolved.   Hypokalemia Resolved. Will continue monitoring with IV diuresis   Tobacco use disorder Encourage cessation. Continue nicotine patch   Generalized weakness/physical deconditioning PT/OT recommended return to SNF for further therapy.  Continue to mobilize.  Out of bed to chair.   Obesity Estimated body mass index is 30.23 kg/m as calculated from the following:   Height as of this encounter: 5' 11" (1.803 m).     Weight as of this encounter: 98.3 kg.   DVT Prophylaxis: Lovenox Code Status: Full code Family Communication: Discussed with patient.  No family at bedside Disposition Plan: Back to SNF when medically improved  Status is: Inpatient Remains inpatient appropriate  because: Small bowel obstruction      Medications: Scheduled:  bisacodyl  10 mg Rectal Daily   Chlorhexidine Gluconate Cloth  6 each Topical Daily   enoxaparin (LOVENOX) injection  50 mg Subcutaneous Q24H   feeding supplement  237 mL Oral BID BM   furosemide  40 mg Intravenous Daily   insulin aspart  0-15 Units Subcutaneous Q6H   insulin starter kit- pen needles  1 kit Other Once   multivitamin  15 mL Oral Daily   pantoprazole (PROTONIX) IV  40 mg Intravenous Q24H   sodium chloride flush  10-40 mL Intracatheter Q12H   Continuous:  sodium chloride 10 mL/hr at 05/01/22 0522   NOB:SJGGEZ chloride, acetaminophen, alum & mag hydroxide-simeth, dextrose, morphine injection, nicotine, ondansetron (ZOFRAN) IV, mouth rinse, oxyCODONE, phenol, sodium chloride flush  Antibiotics: Anti-infectives (From admission, onward)    Start     Dose/Rate Route Frequency Ordered Stop   04/17/22 1100  ceFAZolin (ANCEF) IVPB 2g/100 mL premix        2 g 200 mL/hr over 30 Minutes Intravenous On call to O.R. 04/17/22 1045 04/17/22 1150   04/09/22 1100  Ampicillin-Sulbactam (UNASYN) 3 g in sodium chloride 0.9 % 100 mL IVPB  Status:  Discontinued        3 g 200 mL/hr over 30 Minutes Intravenous Every 8 hours 04/09/22 1045 04/14/22 1419       Objective:  Vital Signs  Vitals:   04/30/22 1702 04/30/22 2102 05/01/22 0344 05/01/22 0745  BP: 117/78 122/77 132/86 119/77  Pulse: (!) 104 99 99 (!) 104  Resp: _0 Temp: 98 F (36.7 C) 98.7 F (37.1 C) 98.5 F (36.9 C) 98.5 F (36.9 C)  TempSrc: Oral Oral Oral Oral  SpO2: 94% 92% 95% 95%  Weight:   98.3 kg   Height:        Intake/Output Summary (Last 24 hours) at 05/01/2022 0936 Last data filed at 05/01/2022 0522 Gross per 24 hour  Intake 2233.65 ml  Output 2300 ml  Net -66.35 ml    Filed Weights   04/29/22 0601 04/30/22 0457 05/01/22 0344  Weight: 100.8 kg 102 kg 98.3 kg    General appearance: Awake alert.  In no distress Resp:  Clear to auscultation bilaterally.  Normal effort Cardio: S1-S2 is normal regular.  No S3-S4.  No rubs murmurs or bruit GI: Abdomen noted to be less distended today compared to yesterday.  Bowel sounds are sluggish however.  No masses organomegaly.  Nontender. Extremities: No edema.  Full range of motion of lower extremities. Neurologic:  No focal neurological deficits.      Lab Results:  Data Reviewed: I have personally reviewed following labs and reports of the imaging studies  CBC: Recent Labs  Lab 04/26/22 0531 04/27/22 1315 04/30/22 0518 05/01/22 0549  WBC 16.4* 15.3* 12.5* 12.0*  HGB 10.2* 10.5* 9.6* 9.9*  HCT 30.4* 31.5* 29.4* 30.9*  MCV 105.2* 104.3* 105.0* 106.6*  PLT 313 334 331 335     Basic Metabolic Panel: Recent Labs  Lab 04/26/22 0531 04/27/22 0344 04/28/22 0235 04/30/22 0518 05/01/22 0549  NA 145 141 138 134* 137  K 4.1 3.9 4.0 4.0 3.8  CL 106 106 103 103 106  CO2  26 25 26 23 23  GLUCOSE 179* 164* 121* 204* 292*  BUN 27* 27* 23 30* 27*  CREATININE 0.78 0.82 0.74 0.87 0.83  CALCIUM 9.5 9.2 9.4 8.9 8.6*  MG 2.5* 2.4 2.1 2.1  --   PHOS 4.1 3.6 3.4 2.5  --      GFR: Estimated Creatinine Clearance: 110.3 mL/min (by C-G formula based on SCr of 0.83 mg/dL).  Liver Function Tests: Recent Labs  Lab 04/26/22 0531 04/30/22 0518  AST 81* 65*  ALT 164* 188*  ALKPHOS 130* 130*  BILITOT 0.8 0.3  PROT 7.3 6.9  ALBUMIN 3.1* 2.9*      CBG: Recent Labs  Lab 04/30/22 1214 04/30/22 1706 05/01/22 0026 05/01/22 0528 05/01/22 0833  GLUCAP 148* 113* 223* 217* 119*     Lipid Profile: Recent Labs    04/30/22 0518  TRIG 312*      Radiology Studies: DG Abd 1 View  Result Date: 04/30/2022 CLINICAL DATA:  Nasogastric tube placement.  Vomiting. EXAM: ABDOMEN - 1 VIEW COMPARISON:  Radiographs 04/29/2022 and 04/24/2022.  CT 04/27/2022. FINDINGS: Tip of the enteric tube projects over the left subphrenic region consistent with position in the  proximal stomach. Persistent diffuse bowel distension which involves the small bowel more than the colon, but most likely represents a postoperative ileus. Midline skin staples are noted. IMPRESSION: Nasogastric tube tip projects over the proximal stomach. Persistent bowel distension favoring a postoperative ileus over a partial small bowel obstruction. Electronically Signed   By: William  Veazey M.D.   On: 04/30/2022 08:32   DG ABD ACUTE 2+V W 1V CHEST  Result Date: 04/29/2022 CLINICAL DATA:  Nausea and vomiting. EXAM: DG ABDOMEN ACUTE WITH 1 VIEW CHEST COMPARISON:  Chest x-ray 04/09/2022. CT abdomen and pelvis 04/27/2022. FINDINGS: Dilated small bowel loops with air-fluid levels are again seen throughout the mid and upper abdomen. Small bowel loops measure up to 5.8 cm in diameter and have increased in size. Air seen throughout nondilated colon to the level the rectum. Midline skin staples are again noted. Lungs are clear. No pleural effusion or pneumothorax. Cardiomediastinal silhouette is within normal limits. Right PICC line tip projects over the cavoatrial junction. No acute fractures are seen. IMPRESSION: 1. Dilated small bowel loops have increased in size worrisome for worsening small-bowel obstruction versus ileus. 2. No acute cardiopulmonary process. Electronically Signed   By: Amy  Guttmann M.D.   On: 04/29/2022 22:27   DG Abd 2 Views  Result Date: 04/29/2022 CLINICAL DATA:  Postoperative ileus. EXAM: ABDOMEN - 2 VIEW COMPARISON:  CT abdomen pelvis, 04/27/2022. Abdominal radiographs, 04/24/2022. FINDINGS: Gas-filled loops of prominent small bowel are notably less dilated than on the CT from 04/27/2022. Residual contrast is noted in a normal caliber colon. Midline skin staples are stable. No free air or bowel air-fluid levels. IMPRESSION: 1. Significant improvement. Partly air-filled loops of prominent small bowel without overt small bowel dilation, significantly decreased in caliber compared to  the CT from 2 days ago. Electronically Signed   By: David  Ormond M.D.   On: 04/29/2022 10:55       LOS: 22 days   Gokul Krishnan  [facility] Pager on [email or web address]  05/01/2022, 9:36 AM   

## 2022-05-02 DIAGNOSIS — R7989 Other specified abnormal findings of blood chemistry: Secondary | ICD-10-CM | POA: Diagnosis not present

## 2022-05-02 DIAGNOSIS — K56609 Unspecified intestinal obstruction, unspecified as to partial versus complete obstruction: Secondary | ICD-10-CM | POA: Diagnosis not present

## 2022-05-02 DIAGNOSIS — E1165 Type 2 diabetes mellitus with hyperglycemia: Secondary | ICD-10-CM | POA: Diagnosis not present

## 2022-05-02 LAB — GLUCOSE, CAPILLARY
Glucose-Capillary: 136 mg/dL — ABNORMAL HIGH (ref 70–99)
Glucose-Capillary: 157 mg/dL — ABNORMAL HIGH (ref 70–99)
Glucose-Capillary: 181 mg/dL — ABNORMAL HIGH (ref 70–99)
Glucose-Capillary: 211 mg/dL — ABNORMAL HIGH (ref 70–99)

## 2022-05-02 MED ORDER — TRAVASOL 10 % IV SOLN
INTRAVENOUS | Status: AC
  Filled 2022-05-02: qty 1296

## 2022-05-02 NOTE — Progress Notes (Signed)
Staples removed from abdominal wound without problem.  Steristrips placed.

## 2022-05-02 NOTE — TOC Progression Note (Signed)
Transition of Care [facility]) - Progression Note    Patient Details  Name: Cody [name] MRN: [id number] Date of Birth: 07-Feb-1961  Transition of Care [facility]) CM/SW [place], [place] Phone Number: 05/02/2022, 5:05 PM  Clinical Narrative:     Patient has SNF bed at [facility] when medically ready. Patient currently on TPN and has NG tube. CSW will continue to follow and assist with patients dc planning needs.  Expected Discharge Plan: [place] Barriers to Discharge: Continued Medical Work up  Expected Discharge Plan and Services Expected Discharge Plan: [place] In-house Referral: Clinical Social Work     Living arrangements for the past 2 months: [place]                                       Social Determinants of Health (SDOH) Interventions    Readmission Risk Interventions     No data to display

## 2022-05-02 NOTE — Progress Notes (Signed)
PHARMACY - TOTAL PARENTERAL NUTRITION CONSULT NOTE   Indication: Small bowel obstruction/prolonged ileus  Patient Measurements: Height: 5\' 11"  (180.3 cm) Weight: 95.8 kg (211 lb 3.2 oz) IBW/kg (Calculated) : 75.3 TPN AdjBW (KG): 82.3 Body mass index is 29.46 kg/m. Usual Weight: 100-103 kg  Assessment:  61 yo M with newly diagnosed type 2 diabetes. Patient developed ileus 9/1 which then evolved into SBO now s/p ex lap 04/17/22. Pt was eating ~50% of dysphagia/carb diet from 8/28-9/1. NPO since 04/14/22. Anticipate prolonged ileus - pharmacy consulted for TPN.   Overnight 9/17, patient reported to have had 2 episodes of large-volume emesis. Stat XR of abd showed worsening of dilated small bowels concerning for developing obstruction versus ileus. Patient placed back on NPO with NG tube on suction.    Glucose / Insulin: A1c 11.7%, BG ; used 14u SSI in 24h Electrolytes: (last labs 9/18) CoCa 9.8, others WNL Renal: Scr 0.87 ; BUN wnl Hepatic: AST/ALT 65/188 (decreased); TGs 337 > 312, albumin 2.9 Intake / Output; MIVF: UOP 1.06 mL/kg/hr (Lasix 40 IV daily), NGT 1257ml (up); LBM 9/19  GI Imaging: 9/03 CT AP: c/f partial or developing SBO  9/04 Ab Xray: c/f distal obstruction or ileus 9/08 Ab Xray: multiple dilated SB compatible with post-op ileus 9/12 Ab Xray: Mild ileus unchanged 9/15 CT: mildly improved ileus vs ongoing partial SBO; No intra-abdominal fluid collection GI Surgeries / Procedures:  9/05 Ex lap, lysis of adhesions (no abscess, no diverticulitis)  9/05 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 9/14 NGT removed   Central access: PICC 04/17/22 TPN start date: 04/18/22  Nutritional Goals: Goal TPN rate is 90 mL/hr (provides 129 g of protein, 73g lipid, 296g dextrose, and 2257 kcals per day)  RD Assessment: Estimated Needs Total Energy Estimated Needs: 2200-2400 Total Protein Estimated Needs: 110-125 grams Total Fluid Estimated  Needs: >/= 2 L  Current Nutrition:  TPN 9/14 CLD 9/15 FLD- ate some soup, no ensure; No TPN given d/t compounder broken down  9/16 FLD- had 1 ensure, 1 soup, 1 pudding; had emesis x1 9/17 FLD- working on ensure 9/18 back to NPO following large emesis episode overnight 9/19 CLD   Plan:  Continue TPN to cycle over 14 hours (83 - 166 mL/hr, GIR 1.94-3.88 mg/kg/min), provides 130 g protein, 2202 kcal, providing 100% of needs Electrolytes in TPN: Continue Na 75 mEq/L,  K 20 mEq/L, Ca 0 mEq/L, Mg 4 mEq/L, Phos 5 mmol/L; Cl:Ac 1:1 Continue standard MVI to PO, removed from TPN  Continue mSSI to Q6h while NPO and adjust as needed Continue regular insulin to 65 units in TPN  Monitor TPN labs Mon/Thurs, and as indicated F/u PO intake, wean TPN as able   Thank you for allowing pharmacy to be a part of this patient's care.  Ardyth Harps, PharmD Clinical Pharmacist

## 2022-05-02 NOTE — Progress Notes (Addendum)
[facility] PROGRESS NOTE   Cody [name] MKL:[id number] DOB: Jan 26, 1961 DOA: 04/09/2022  PCP: Ladell Pier, MD  Brief History/Interval Summary:  61 y.o. male with a history of GERD, allergic rhinitis who presented 8/28 from [facility] SN[facility] with confusion found to be in DKA (new diagnosis of diabetes). Neuroimaging showed no stroke. the patient was also hypoxic, diagnosed with aspiration pneumonia and improved on antibiotics. DKA, AKI, encephalopathy all resolved.    He developed abdominal pain and distention, vomiting with SBO shown on CT. Surgery was consulted, performed ex lap with LOA by Dr. Brantley Stage on 9/5 with biopsy suggestive of adhesion.  Patient was started on TPN.  Consultants: General surgery.  Procedures: Exploratory laparotomy    Subjective/Interval History: Patient denies any complaints.  Denies any abdominal pain nausea or vomiting.  No difficulty breathing.  Denies any cough.   Assessment/Plan:  Small bowel obstruction/postoperative ileus S/p ex lap, LOA by Dr. Brantley Stage 9/5. Biopsy reassuring with benign dense fibrovascular stroma suggestive of adhesion.  NG tube was pulled out on 9/14.  Started on clear liquids and was advanced to full liquids. Subsequently patient had worsening abdominal distention and he had episodes of vomiting.  NG tube had to be inserted. CT scan of the abdomen done on 9/15 did not show any abscess.  He did have a bowel movement. General surgery is managing.   T2DM newly diagnosed presenting with DKA, hyperglycemia A1c 11.7%. C-peptide low at 0.6, consider recheck/stim testing outside scope of DKA/acute illness.  Was on long-acting insulin.  However due to small bowel obstruction and n.p.o. status this was discontinued.  Getting insulin through TPN.  Once he is off of TPN will likely need to reinitiate his long-acting insulin depending on his oral intake as well as CBG readings.   Acute hypoxic respiratory failure/aspiration  pneumonia/sepsis present on admission Initially related to aspiration pneumonia and sepsis.  TTE with normal LVEF and G1 DD. Patient was diuresed.  Renal function is stable.   CT scan did not show any PE.  There were opacities suggestive of infiltrate versus edema. Incentive spirometry. Remains on Lasix once daily. Respiratory status is stable.  Saturating normal on room air.  Nutrition Patient was placed on TPN.  Continue until he is able to tolerate diet.   Hyperlipidemia LDL 151.  Resume statin at discharge.   Acute kidney injury In the setting of DKA.  Resolved.   Acute metabolic encephalopathy/strokelike symptoms CVA work-up unrevealing.  EEG unremarkable. Resolved.   Abnormal LFTs Mildly elevated AST and ALT noted.  No abnormality in the hepatobiliary system on CT scan.  LFTs are stable for the most part.  Hepatitis panel is unremarkable.   Acute blood loss anemia Drop in hemoglobin is likely dilutional.  No evidence of overt blood loss.   Sinus tachycardia Improved. No fevers, no PE on CTA. IV metoprolol as needed HR > 120.   GERD: Continue PPI   Mild hypernatremia/hypercalcemia Likely from dehydration.  Resolved.   Hypokalemia Resolved. Will continue monitoring with IV diuresis.  Check labs tomorrow.   Tobacco use disorder Encourage cessation. Continue nicotine patch   Generalized weakness/physical deconditioning PT/OT recommended return to SNF for further therapy.  Continue to mobilize.  Out of bed to chair.   Obesity Estimated body mass index is 29.46 kg/m as calculated from the following:   Height as of this encounter: 5' 11" (1.803 m).   Weight as of this encounter: 95.8 kg.   DVT Prophylaxis: Lovenox Code Status:  Full code Family Communication: Discussed with patient.  No family at bedside Disposition Plan: Back to SNF when medically improved.  Continue to mobilize.  Status is: Inpatient Remains inpatient appropriate because: Small bowel  obstruction      Medications: Scheduled:  bisacodyl  10 mg Rectal Daily   Chlorhexidine Gluconate Cloth  6 each Topical Daily   enoxaparin (LOVENOX) injection  50 mg Subcutaneous Q24H   feeding supplement  1 Container Oral TID BM   furosemide  40 mg Intravenous Daily   insulin aspart  0-15 Units Subcutaneous Q6H   insulin starter kit- pen needles  1 kit Other Once   metoCLOPramide (REGLAN) injection  5 mg Intravenous Q8H   multivitamin with minerals  1 tablet Oral Daily   pantoprazole (PROTONIX) IV  40 mg Intravenous Q24H   sodium chloride flush  10-40 mL Intracatheter Q12H   Continuous:  sodium chloride 10 mL/hr at 05/02/22 0725   AST:MHDQQI chloride, acetaminophen, alum & mag hydroxide-simeth, dextrose, morphine injection, nicotine, ondansetron (ZOFRAN) IV, mouth rinse, oxyCODONE, phenol, sodium chloride flush  Antibiotics: Anti-infectives (From admission, onward)    Start     Dose/Rate Route Frequency Ordered Stop   04/17/22 1100  ceFAZolin (ANCEF) IVPB 2g/100 mL premix        2 g 200 mL/hr over 30 Minutes Intravenous On call to O.R. 04/17/22 1045 04/17/22 1150   04/09/22 1100  Ampicillin-Sulbactam (UNASYN) 3 g in sodium chloride 0.9 % 100 mL IVPB  Status:  Discontinued        3 g 200 mL/hr over 30 Minutes Intravenous Every 8 hours 04/09/22 1045 04/14/22 1419       Objective:  Vital Signs  Vitals:   05/01/22 2005 05/02/22 0015 05/02/22 0401 05/02/22 0903  BP: 103/75 116/78 120/77 117/86  Pulse: (!) 106 (!) 103 (!) 109 (!) 107  Resp: _0 Temp: (!) 97.2 F (36.2 C)  97.7 F (36.5 C) 98.3 F (36.8 C)  TempSrc: Oral  Oral Oral  SpO2: 94% 93% 91% 94%  Weight:   95.8 kg   Height:        Intake/Output Summary (Last 24 hours) at 05/02/2022 1015 Last data filed at 05/02/2022 0904 Gross per 24 hour  Intake 2439.38 ml  Output 2725 ml  Net -285.62 ml    Filed Weights   04/30/22 0457 05/01/22 0344 05/02/22 0401  Weight: 102 kg 98.3 kg 95.8 kg     General appearance: Awake alert.  In no distress Resp: Normal effort.  Diminished air entry at the bases.  No wheezing or rhonchi Cardio: S1-S2 is normal regular.  No S3-S4.  No rubs murmurs or bruit GI: Abdomen is soft.  Nontender nondistended.   No masses organomegaly.  Bowel sounds present but sluggish Extremities: No edema.  Full range of motion of lower extremities.       Lab Results:  Data Reviewed: I have personally reviewed following labs and reports of the imaging studies  CBC: Recent Labs  Lab 04/26/22 0531 04/27/22 1315 04/30/22 0518 05/01/22 0549  WBC 16.4* 15.3* 12.5* 12.0*  HGB 10.2* 10.5* 9.6* 9.9*  HCT 30.4* 31.5* 29.4* 30.9*  MCV 105.2* 104.3* 105.0* 106.6*  PLT 313 334 331 335     Basic Metabolic Panel: Recent Labs  Lab 04/26/22 0531 04/27/22 0344 04/28/22 0235 04/30/22 0518 05/01/22 0549  NA 145 141 138 134* 137  K 4.1 3.9 4.0 4.0 3.8  CL 106 106 103 103 106  CO2 26  _0 GLUCOSE 179* 164* 121* 204* 292*  BUN 27* 27* 23 30* 27*  CREATININE 0.78 0.82 0.74 0.87 0.83  CALCIUM 9.5 9.2 9.4 8.9 8.6*  MG 2.5* 2.4 2.1 2.1  --   PHOS 4.1 3.6 3.4 2.5  --      GFR: Estimated Creatinine Clearance: 109 mL/min (by C-G formula based on SCr of 0.83 mg/dL).  Liver Function Tests: Recent Labs  Lab 04/26/22 0531 04/30/22 0518  AST 81* 65*  ALT 164* 188*  ALKPHOS 130* 130*  BILITOT 0.8 0.3  PROT 7.3 6.9  ALBUMIN 3.1* 2.9*      CBG: Recent Labs  Lab 05/01/22 0833 05/01/22 1203 05/01/22 1631 05/02/22 0010 05/02/22 0602  GLUCAP 119* 125* 140* 181* 211*     Lipid Profile: Recent Labs    04/30/22 0518  TRIG 312*      Radiology Studies: No results found.     LOS: 23 days   Amun Stemm [employer] on [email or web address]  05/02/2022, 10:15 AM

## 2022-05-02 NOTE — Progress Notes (Signed)
15 Days Post-Op   Subjective/Chief Complaint: Notified after seeing him yesterday that NGT output was around 1L and was not recorded.  NGT left to LIWS.  Today with only about 300cc out in last 24 hrs.  hungry   Objective: Vital signs in last 24 hours: Temp:  [97.2 F (36.2 C)-98.3 F (36.8 C)] 98.3 F (36.8 C) (09/20 0903) Pulse Rate:  [103-109] 107 (09/20 0903) Resp:  [16-20] 20 (09/20 0903) BP: (103-120)/(75-86) 117/86 (09/20 0903) SpO2:  [91 %-94 %] 94 % (09/20 0903) Weight:  [95.8 kg] 95.8 kg (09/20 0401) Last BM Date : 05/01/22  Intake/Output from previous day: 09/19 0701 - 09/20 0700 In: 1884.2 [P.O.:360; I.V.:1524.2] Out: 2425 [Urine:1825; Emesis/NG output:600] Intake/Output this shift: Total I/O In: 555.2 [I.V.:555.2] Out: 300 [Emesis/NG output:300]  PE: GI: softer today.  NGT with essentially no output, midline incisions with staples, c/d/i   Lab Results:  Recent Labs    04/30/22 0518 05/01/22 0549  WBC 12.5* 12.0*  HGB 9.6* 9.9*  HCT 29.4* 30.9*  PLT 331 335   BMET Recent Labs    04/30/22 0518 05/01/22 0549  NA 134* 137  K 4.0 3.8  CL 103 106  CO2 23 23  GLUCOSE 204* 292*  BUN 30* 27*  CREATININE 0.87 0.83  CALCIUM 8.9 8.6*   PT/INR No results for input(s): "LABPROT", "INR" in the last 72 hours. ABG No results for input(s): "PHART", "HCO3" in the last 72 hours.  Invalid input(s): "PCO2", "PO2"  Studies/Results: No results found.  Anti-infectives: Anti-infectives (From admission, onward)    Start     Dose/Rate Route Frequency Ordered Stop   04/17/22 1100  ceFAZolin (ANCEF) IVPB 2g/100 mL premix        2 g 200 mL/hr over 30 Minutes Intravenous On call to O.R. 04/17/22 1045 04/17/22 1150   04/09/22 1100  Ampicillin-Sulbactam (UNASYN) 3 g in sodium chloride 0.9 % 100 mL IVPB  Status:  Discontinued        3 g 200 mL/hr over 30 Minutes Intravenous Every 8 hours 04/09/22 1045 04/14/22 1419       Assessment/Plan: POD 15, s/p  Exploratory laparotomy, lysis of adhesions for SBO by Dr. Brantley Stage, 04/17/22 - minimal NGT output, + bowel function. -clamp NGT and give clear liquids.  If tolerates, will DC NGT -cont reglan -DC staples - continue abdominal binder - afebrile.  WBC down to 12.0.  CT scan on Friday with no intra-abdominal process or infection - Cont TPN  - Bx Mesentery of SB reassuring with benign dense fibrovascular stroma compatible with an adhesion - Mobilize for bowel function, PT. Currently recommending SNF. Plan is to return to [facility] at d/c.  - Pulm toilet  FEN - CLD/ NGT clamped, TPN, IVF per TRH. VTE - SCDs; Lovenox (hgb stable) ID - None currently. Afebrile. WBC downtrending   - Per TRH -  New dx DM (DKA on presentation) AKI - resolved PNA - completed abx  LOS: 23 days   Cody [name], [facility] Surgery 05/02/2022, 10:15 AM Please see Amion for pager number during day hours 7:00am-4:30pm

## 2022-05-02 NOTE — Progress Notes (Signed)
NGT removed without problems.

## 2022-05-02 NOTE — Progress Notes (Signed)
Mobility Specialist - Progress Note   05/02/22 1434  Mobility  Activity Ambulated with assistance in hallway  Level of Assistance +2 (takes two people) (Chair follow)  Assistive Device Front wheel walker  Distance Ambulated (ft) 220 ft  Activity Response Tolerated fair  $Mobility charge 1 Mobility   Pt received in bed and agreeable to mobility. Pt was a +2 due to chair follow. ModA to stand. Pt required x2 seated rest breaks and was wheeled back to room due to general weakness. Pt was left in chair with all needs met and RN present.   Larey Seat

## 2022-05-03 DIAGNOSIS — R7989 Other specified abnormal findings of blood chemistry: Secondary | ICD-10-CM | POA: Diagnosis not present

## 2022-05-03 DIAGNOSIS — R000 Tachycardia, unspecified: Secondary | ICD-10-CM | POA: Diagnosis not present

## 2022-05-03 DIAGNOSIS — K56609 Unspecified intestinal obstruction, unspecified as to partial versus complete obstruction: Secondary | ICD-10-CM | POA: Diagnosis not present

## 2022-05-03 DIAGNOSIS — E1165 Type 2 diabetes mellitus with hyperglycemia: Secondary | ICD-10-CM | POA: Diagnosis not present

## 2022-05-03 LAB — CBC
HCT: 31.1 % — ABNORMAL LOW (ref 39.0–52.0)
Hemoglobin: 10.3 g/dL — ABNORMAL LOW (ref 13.0–17.0)
MCH: 35 pg — ABNORMAL HIGH (ref 26.0–34.0)
MCHC: 33.1 g/dL (ref 30.0–36.0)
MCV: 105.8 fL — ABNORMAL HIGH (ref 80.0–100.0)
Platelets: 347 10*3/uL (ref 150–400)
RBC: 2.94 MIL/uL — ABNORMAL LOW (ref 4.22–5.81)
RDW: 15 % (ref 11.5–15.5)
WBC: 11.4 10*3/uL — ABNORMAL HIGH (ref 4.0–10.5)
nRBC: 0.3 % — ABNORMAL HIGH (ref 0.0–0.2)

## 2022-05-03 LAB — COMPREHENSIVE METABOLIC PANEL
ALT: 86 U/L — ABNORMAL HIGH (ref 0–44)
AST: 21 U/L (ref 15–41)
Albumin: 3 g/dL — ABNORMAL LOW (ref 3.5–5.0)
Alkaline Phosphatase: 108 U/L (ref 38–126)
Anion gap: 9 (ref 5–15)
BUN: 28 mg/dL — ABNORMAL HIGH (ref 8–23)
CO2: 22 mmol/L (ref 22–32)
Calcium: 9.1 mg/dL (ref 8.9–10.3)
Chloride: 106 mmol/L (ref 98–111)
Creatinine, Ser: 0.79 mg/dL (ref 0.61–1.24)
GFR, Estimated: >60 mL/min (ref >60)
Glucose, Bld: 184 mg/dL — ABNORMAL HIGH (ref 70–99)
Potassium: 3.2 mmol/L — ABNORMAL LOW (ref 3.5–5.1)
Sodium: 137 mmol/L (ref 135–145)
Total Bilirubin: 0.3 mg/dL (ref 0.3–1.2)
Total Protein: 6.8 g/dL (ref 6.5–8.1)

## 2022-05-03 LAB — GLUCOSE, CAPILLARY
Glucose-Capillary: 149 mg/dL — ABNORMAL HIGH (ref 70–99)
Glucose-Capillary: 153 mg/dL — ABNORMAL HIGH (ref 70–99)
Glucose-Capillary: 185 mg/dL — ABNORMAL HIGH (ref 70–99)
Glucose-Capillary: 199 mg/dL — ABNORMAL HIGH (ref 70–99)

## 2022-05-03 LAB — TRIGLYCERIDES: Triglycerides: 327 mg/dL — ABNORMAL HIGH (ref <150)

## 2022-05-03 LAB — MAGNESIUM: Magnesium: 2.2 mg/dL (ref 1.7–2.4)

## 2022-05-03 LAB — PHOSPHORUS: Phosphorus: 3 mg/dL (ref 2.5–4.6)

## 2022-05-03 MED ORDER — POTASSIUM CHLORIDE 10 MEQ/100ML IV SOLN
10.0000 meq | INTRAVENOUS | Status: AC
  Administered 2022-05-03 (×4): 10 meq via INTRAVENOUS
  Filled 2022-05-03 (×4): qty 100

## 2022-05-03 MED ORDER — TRAVASOL 10 % IV SOLN
INTRAVENOUS | Status: AC
  Filled 2022-05-03: qty 1296

## 2022-05-03 NOTE — Progress Notes (Signed)
16 Days Post-Op   Subjective/Chief Complaint: NGT out yesterday afternoon. Tolerating clears without nausea but does feel a little bloated. Had a BM yesterday. Denies abdominal pain  Objective: Vital signs in last 24 hours: Temp:  [97.8 F (36.6 C)-98.7 F (37.1 C)] 98.2 F (36.8 C) (09/21 0430) Pulse Rate:  [101-115] 101 (09/21 0430) Resp:  [19-22] 20 (09/21 0430) BP: (110-126)/(73-87) 120/73 (09/21 0430) SpO2:  [94 %] 94 % (09/21 0430) Weight:  [99 kg] 99 kg (09/21 0430) Last BM Date : 05/02/22  Intake/Output from previous day: 09/20 0701 - 09/21 0700 In: 3214.5 [P.O.:600; I.V.:2614.5] Out: 1100 [Urine:800; Emesis/NG output:300] Intake/Output this shift: No intake/output data recorded.  PE: GI: soft. Active bowel sounds. NT. Abd binder in place - removed for exam. Incision with steri strips. No discharge or erythema   Lab Results:  Recent Labs    05/01/22 0549 05/03/22 0443  WBC 12.0* 11.4*  HGB 9.9* 10.3*  HCT 30.9* 31.1*  PLT 335 347    BMET Recent Labs    05/01/22 0549 05/03/22 0443  NA 137 137  K 3.8 3.2*  CL 106 106  CO2 23 22  GLUCOSE 292* 184*  BUN 27* 28*  CREATININE 0.83 0.79  CALCIUM 8.6* 9.1    PT/INR No results for input(s): "LABPROT", "INR" in the last 72 hours. ABG No results for input(s): "PHART", "HCO3" in the last 72 hours.  Invalid input(s): "PCO2", "PO2"  Studies/Results: No results found.  Anti-infectives: Anti-infectives (From admission, onward)    Start     Dose/Rate Route Frequency Ordered Stop   04/17/22 1100  ceFAZolin (ANCEF) IVPB 2g/100 mL premix        2 g 200 mL/hr over 30 Minutes Intravenous On call to O.R. 04/17/22 1045 04/17/22 1150   04/09/22 1100  Ampicillin-Sulbactam (UNASYN) 3 g in sodium chloride 0.9 % 100 mL IVPB  Status:  Discontinued        3 g 200 mL/hr over 30 Minutes Intravenous Every 8 hours 04/09/22 1045 04/14/22 1419       Assessment/Plan: POD 16, s/p Exploratory laparotomy, lysis of  adhesions for SBO by Dr. Brantley Stage, 04/17/22 - NGT out yesterday. Tolerating clears. Advance to FLD if tolerates breakfast - staples out 9/20 -cont reglan - continue abdominal binder - afebrile.  WBC down to 11.4.  CT scan on Friday with no intra-abdominal process or infection - Cont TPN - can wean once tolerating soft diet - Bx Mesentery of SB reassuring with benign dense fibrovascular stroma compatible with an adhesion - Mobilize for bowel function, PT. Currently recommending SNF. Plan is to return to [facility] at d/c.  - Pulm toilet  FEN - CLD ADAT FLD, TPN, IVF per TRH. VTE - SCDs; Lovenox (hgb stable) ID - None currently. Afebrile. WBC downtrending   - Per TRH -  New dx DM (DKA on presentation) AKI - resolved PNA - completed abx  LOS: 24 days   Winferd Humphrey, [facility] Surgery 05/03/2022, 7:57 AM Please see Amion for pager number during day hours 7:00am-4:30pm

## 2022-05-03 NOTE — Progress Notes (Signed)
PHARMACY - TOTAL PARENTERAL NUTRITION CONSULT NOTE   Indication: Small bowel obstruction/prolonged ileus  Patient Measurements: Height: 5\' 11"  (180.3 cm) Weight: 99 kg (218 lb 4.1 oz) IBW/kg (Calculated) : 75.3 TPN AdjBW (KG): 82.3 Body mass index is 30.44 kg/m. Usual Weight: 100-103 kg  Assessment:  61 yo M with newly diagnosed type 2 diabetes. Patient developed ileus 9/1 which then evolved into SBO now s/p ex lap 04/17/22. Pt was eating ~50% of dysphagia/carb diet from 8/28-9/1. NPO since 04/14/22. Anticipate prolonged ileus - pharmacy consulted for TPN.   Overnight 9/17, patient reported to have had 2 episodes of large-volume emesis. Stat XR of abd showed worsening of dilated small bowels concerning for developing obstruction versus ileus. Patient placed back on NPO with NG tube on suction.    Glucose / Insulin: A1c 11.7%, BG 136-185; used 11u SSI in 24h Electrolytes: (last labs 9/21) K down at 3.2, CoCa 9.9, others WNL Renal: Scr 0.79 ; BUN 28 Hepatic: AST/ALT 21/86 (decreased); TGs 337 > 327, albumin 3.0 Intake / Output; MIVF: UOP down at 675 mL/kg/hr - partial charting (Lasix 40 IV daily), NGT 300 mL (down); LBM 9/20  GI Imaging: 9/03 CT AP: c/f partial or developing SBO  9/04 Ab Xray: c/f distal obstruction or ileus 9/08 Ab Xray: multiple dilated SB compatible with post-op ileus 9/12 Ab Xray: Mild ileus unchanged 9/15 CT: mildly improved ileus vs ongoing partial SBO; No intra-abdominal fluid collection GI Surgeries / Procedures:  9/05 Ex lap, lysis of adhesions (no abscess, no diverticulitis)  9/05 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 9/14 NGT removed   Central access: PICC 04/17/22 TPN start date: 04/18/22  Nutritional Goals: Goal TPN rate is 90 mL/hr (provides 129 g of protein, 73g lipid, 296g dextrose, and 2257 kcals per day)  RD Assessment: Estimated Needs Total Energy Estimated Needs: 2200-2400 Total Protein Estimated  Needs: 110-125 grams Total Fluid Estimated Needs: >/= 2 L  Current Nutrition:  TPN 9/14 CLD 9/15 FLD- ate some soup, no ensure; No TPN given d/t compounder broken down  9/16 FLD- had 1 ensure, 1 soup, 1 pudding; had emesis x1 9/17 FLD- working on ensure 9/18 back to NPO following large emesis episode overnight 9/19 CLD 9/20 CLD- had several juices, jello, broth > all tolerated well 9/21 FLD   Plan:  Continue TPN to cycle over 14 hours (83 - 166 mL/hr, GIR 1.94-3.88 mg/kg/min), provides 130 g protein, 2202 kcal, providing 100% of needs Electrolytes in TPN: Continue Na 75 mEq/L, increase K  to 25 mEq/L (total 54 mEq/day), Ca 0 mEq/L, Mg 4 mEq/L, Phos 5 mmol/L; Cl:Ac 1:1 x4 IV Kcl 10 mEq (40 mEq total) Continue standard MVI to PO, removed from TPN  Continue mSSI to Q6h while NPO and adjust as needed Continue regular insulin to 65 units in TPN  Monitor TPN labs Mon/Thurs, and as indicated F/u PO intake, wean TPN as able   Thank you for allowing pharmacy to be a part of this patient's care.  Ardyth Harps, PharmD Clinical Pharmacist

## 2022-05-03 NOTE — TOC Progression Note (Signed)
Transition of Care [facility]) - Progression Note    Patient Details  Name: Cody [name] MRN: [id number] Date of Birth: [birth year]  [facility] CM/SW [place], [place] Phone Number: 05/03/2022, 2:07 PM  Clinical Narrative:     Patient has SNF bed at [facility] when medically ready. Patient currently on TPN .. CSW will continue to follow and assist with patients dc planning needs.  Expected Discharge Plan: [place] Barriers to Discharge: Continued Medical Work up  Expected Discharge Plan and Services Expected Discharge Plan: [place] In-house Referral: Clinical Social Work     Living arrangements for the past 2 months: [place]                                       Social Determinants of Health (SDOH) Interventions    Readmission Risk Interventions     No data to display

## 2022-05-03 NOTE — Progress Notes (Addendum)
[facility] PROGRESS NOTE   Cody [name] PXT:[id number] DOB: 30-Dec-1960 DOA: 04/09/2022  PCP: Ladell Pier, MD  Brief History/Interval Summary:  61 y.o. male with a history of GERD, allergic rhinitis who presented 8/28 from [facility] SN[facility] with confusion found to be in DKA (new diagnosis of diabetes). Neuroimaging showed no stroke. the patient was also hypoxic, diagnosed with aspiration pneumonia and improved on antibiotics. DKA, AKI, encephalopathy all resolved.    He developed abdominal pain and distention, vomiting with SBO shown on CT. Surgery was consulted, performed ex lap with LOA by Dr. Brantley Stage on 9/5 with biopsy suggestive of adhesion.  Patient was started on TPN.  Consultants: General surgery.  Procedures: Exploratory laparotomy    Subjective/Interval History: Patient denies any complaints.  Specifically denies shortness of breath, abdominal pain, nausea vomiting, chest pain.  Mentions that he is starting to pass gas again.  Did have a bowel movement yesterday.   Assessment/Plan:  Small bowel obstruction/postoperative ileus S/p ex lap, LOA by Dr. Brantley Stage 9/5. Biopsy reassuring with benign dense fibrovascular stroma suggestive of adhesion.  NG tube was pulled out on 9/14.  Started on clear liquids and was advanced to full liquids. Subsequently patient had worsening abdominal distention and he had episodes of vomiting.  NG tube had to be reinserted. CT scan of the abdomen done on 9/15 did not show any abscess.  Bowel function seems to be improving.  NG tube was discontinued yesterday.  General surgery is following.  Plan to advance diet today.   T2DM newly diagnosed presenting with DKA, hyperglycemia A1c 11.7%. C-peptide low at 0.6, consider recheck/stim testing outside scope of DKA/acute illness.  Was on long-acting insulin.  However due to small bowel obstruction and n.p.o. status this was discontinued.  Getting insulin through TPN.  Once he is off of TPN  will likely need to reinitiate his long-acting insulin depending on his oral intake as well as CBG readings.   Acute hypoxic respiratory failure/aspiration pneumonia/sepsis present on admission Initially related to aspiration pneumonia and sepsis.  TTE with normal LVEF and G1 DD. Patient was diuresed. CT scan did not show any PE.  There were opacities suggestive of infiltrate versus edema.  He was maintained on once a day furosemide.  His BUN is noted to be elevated and he is a little bit tachycardic which could be suggestive of hypovolemia.  We will hold his Lasix for now.  Monitor volume status closely. Continue with incentive spirometry.  Saturating normal on room air.  Hypokalemia Noted to have low potassium level this morning.  Will be repleted.  Magnesium noted to be 2.2.  We will recheck labs tomorrow.    Nutrition Patient was placed on TPN.  Continue until he is able to tolerate soft diet.   Hyperlipidemia LDL 151.  Resume statin at discharge.   Acute kidney injury In the setting of DKA.  Resolved.   Acute metabolic encephalopathy/strokelike symptoms CVA work-up unrevealing.  EEG unremarkable. Resolved.   Abnormal LFTs Mildly elevated AST and ALT noted.  No abnormality in the hepatobiliary system on CT scan.  LFTs are stable for the most part.  Hepatitis panel is unremarkable.   Acute blood loss anemia Drop in hemoglobin is likely dilutional.  No evidence of overt blood loss.   Sinus tachycardia Noted to be tachycardic this morning though asymptomatic.  He has been noted to be tachycardic, most of this admission.  TSH was noted to be 0.2 on 8/28 but free T4 was  normal at 0.76.  IV metoprolol as needed is ordered.  Since he is asymptomatic for the most part we will continue to monitor for now.  Tachycardia probably related to his acute illness.  Could also be due to a degree of hypovolemia as he is on daily Lasix.  We will hold his furosemide for now as discussed above.    GERD: Continue PPI   Mild hypernatremia/hypercalcemia Likely from dehydration.  Resolved.   Tobacco use disorder Encourage cessation. Continue nicotine patch   Generalized weakness/physical deconditioning PT/OT recommended return to SNF for further therapy.  Continue to mobilize.  Out of bed to chair.   Obesity Estimated body mass index is 30.44 kg/m as calculated from the following:   Height as of this encounter: '5\' 11"'  (1.803 m).   Weight as of this encounter: 99 kg.   DVT Prophylaxis: Lovenox Code Status: Full code Family Communication: Discussed with patient.  No family at bedside Disposition Plan: Back to SNF when medically improved.  Continue to mobilize.  Status is: Inpatient Remains inpatient appropriate because: Small bowel obstruction      Medications: Scheduled:  bisacodyl  10 mg Rectal Daily   Chlorhexidine Gluconate Cloth  6 each Topical Daily   enoxaparin (LOVENOX) injection  50 mg Subcutaneous Q24H   feeding supplement  1 Container Oral TID BM   furosemide  40 mg Intravenous Daily   insulin aspart  0-15 Units Subcutaneous Q6H   insulin starter kit- pen needles  1 kit Other Once   metoCLOPramide (REGLAN) injection  5 mg Intravenous Q8H   multivitamin with minerals  1 tablet Oral Daily   pantoprazole (PROTONIX) IV  40 mg Intravenous Q24H   sodium chloride flush  10-40 mL Intracatheter Q12H   Continuous:  sodium chloride 10 mL/hr at 05/03/22 0635   potassium chloride 10 mEq (05/03/22 1015)   VOP:FYTWKM chloride, acetaminophen, alum & mag hydroxide-simeth, dextrose, morphine injection, nicotine, ondansetron (ZOFRAN) IV, mouth rinse, oxyCODONE, phenol, sodium chloride flush  Antibiotics: Anti-infectives (From admission, onward)    Start     Dose/Rate Route Frequency Ordered Stop   04/17/22 1100  ceFAZolin (ANCEF) IVPB 2g/100 mL premix        2 g 200 mL/hr over 30 Minutes Intravenous On call to O.R. 04/17/22 1045 04/17/22 1150   04/09/22 1100   Ampicillin-Sulbactam (UNASYN) 3 g in sodium chloride 0.9 % 100 mL IVPB  Status:  Discontinued        3 g 200 mL/hr over 30 Minutes Intravenous Every 8 hours 04/09/22 1045 04/14/22 1419       Objective:  Vital Signs  Vitals:   05/02/22 1354 05/02/22 1531 05/02/22 2031 05/03/22 0430  BP: 114/87 110/84 126/83 120/73  Pulse: (!) 114 (!) 115 (!) 113 (!) 101  Resp: 20 (!) '22 19 20  ' Temp: 98.7 F (37.1 C) 97.8 F (36.6 C) 98.6 F (37 C) 98.2 F (36.8 C)  TempSrc: Oral Oral Oral Oral  SpO2: 94% 94% 94% 94%  Weight:    99 kg  Height:        Intake/Output Summary (Last 24 hours) at 05/03/2022 1051 Last data filed at 05/03/2022 0635 Gross per 24 hour  Intake 2419.37 ml  Output 800 ml  Net 1619.37 ml    Filed Weights   05/01/22 0344 05/02/22 0401 05/03/22 0430  Weight: 98.3 kg 95.8 kg 99 kg    General appearance: Awake alert.  In no distress Resp: Clear to auscultation bilaterally.  Normal effort Cardio:  S1-S2 is tachycardic regular.  No S3-S4.  No rubs murmurs or bruit GI: Abdomen is soft.  Bowel sounds.  Nontender. Extremities: No edema.  Full range of motion of lower extremities.       Lab Results:  Data Reviewed: I have personally reviewed following labs and reports of the imaging studies  CBC: Recent Labs  Lab 04/27/22 1315 04/30/22 0518 05/01/22 0549 05/03/22 0443  WBC 15.3* 12.5* 12.0* 11.4*  HGB 10.5* 9.6* 9.9* 10.3*  HCT 31.5* 29.4* 30.9* 31.1*  MCV 104.3* 105.0* 106.6* 105.8*  PLT 334 331 335 347     Basic Metabolic Panel: Recent Labs  Lab 04/27/22 0344 04/28/22 0235 04/30/22 0518 05/01/22 0549 05/03/22 0443  NA 141 138 134* 137 137  K 3.9 4.0 4.0 3.8 3.2*  CL 106 103 103 106 106  CO2 '25 26 23 23 22  ' GLUCOSE 164* 121* 204* 292* 184*  BUN 27* 23 30* 27* 28*  CREATININE 0.82 0.74 0.87 0.83 0.79  CALCIUM 9.2 9.4 8.9 8.6* 9.1  MG 2.4 2.1 2.1  --  2.2  PHOS 3.6 3.4 2.5  --  3.0     GFR: Estimated Creatinine Clearance: 114.8 mL/min (by  C-G formula based on SCr of 0.79 mg/dL).  Liver Function Tests: Recent Labs  Lab 04/30/22 0518 05/03/22 0443  AST 65* 21  ALT 188* 86*  ALKPHOS 130* 108  BILITOT 0.3 0.3  PROT 6.9 6.8  ALBUMIN 2.9* 3.0*      CBG: Recent Labs  Lab 05/02/22 0602 05/02/22 1154 05/02/22 1816 05/03/22 0010 05/03/22 0620  GLUCAP 211* 157* 136* 185* 199*     Lipid Profile: Recent Labs    05/03/22 0443  TRIG 327*      Radiology Studies: No results found.     LOS: 24 days   Chamari Cutbirth [employer] on [email or web address]  05/03/2022, 10:51 AM

## 2022-05-03 NOTE — Progress Notes (Signed)
Mobility Specialist - Progress Note   05/03/22 1135  Mobility  Activity Ambulated with assistance in hallway  Level of Assistance Minimal assist, patient does 75% or more  Assistive Device Front wheel walker  Distance Ambulated (ft) 200 ft  Activity Response Tolerated fair  $Mobility charge 1 Mobility   Pt was received in bed and agreeable to mobility. Pt c/o nausea throughout ambulation. Pt was returned to bed with all needs met and RN notified.  Larey Seat

## 2022-05-04 DIAGNOSIS — E1310 Other specified diabetes mellitus with ketoacidosis without coma: Secondary | ICD-10-CM | POA: Diagnosis not present

## 2022-05-04 LAB — BASIC METABOLIC PANEL WITH GFR
Anion gap: 6 (ref 5–15)
BUN: 24 mg/dL — ABNORMAL HIGH (ref 8–23)
CO2: 23 mmol/L (ref 22–32)
Calcium: 8.8 mg/dL — ABNORMAL LOW (ref 8.9–10.3)
Chloride: 106 mmol/L (ref 98–111)
Creatinine, Ser: 0.74 mg/dL (ref 0.61–1.24)
GFR, Estimated: >60 mL/min
Glucose, Bld: 183 mg/dL — ABNORMAL HIGH (ref 70–99)
Potassium: 3.7 mmol/L (ref 3.5–5.1)
Sodium: 135 mmol/L (ref 135–145)

## 2022-05-04 LAB — GLUCOSE, CAPILLARY
Glucose-Capillary: 110 mg/dL — ABNORMAL HIGH (ref 70–99)
Glucose-Capillary: 147 mg/dL — ABNORMAL HIGH (ref 70–99)
Glucose-Capillary: 172 mg/dL — ABNORMAL HIGH (ref 70–99)
Glucose-Capillary: 196 mg/dL — ABNORMAL HIGH (ref 70–99)
Glucose-Capillary: 205 mg/dL — ABNORMAL HIGH (ref 70–99)

## 2022-05-04 MED ORDER — ENSURE ENLIVE PO LIQD
237.0000 mL | Freq: Two times a day (BID) | ORAL | Status: DC
  Administered 2022-05-04 – 2022-05-05 (×3): 237 mL via ORAL

## 2022-05-04 MED ORDER — TRAVASOL 10 % IV SOLN
INTRAVENOUS | Status: DC
  Filled 2022-05-04: qty 1231.2

## 2022-05-04 NOTE — Progress Notes (Signed)
Nutrition Follow-up  DOCUMENTATION CODES:   Not applicable  INTERVENTION:   Discontinue Boost Breeze, replace with Ensure Enlive po BID, each supplement provides 350 kcal and 20 grams of protein. Continue TPN, management per pharmacy Continue Multivitamin w/ minerals daily  NUTRITION DIAGNOSIS:   Increased nutrient needs related to post-op healing as evidenced by estimated needs. - Ongoing  GOAL:   Patient will meet greater than or equal to 90% of their needs - Being addressed via TPN  MONITOR:   PO intake, Supplement acceptance, Diet advancement, I & O's, Labs  REASON FOR ASSESSMENT:   Consult New TPN/TNA  ASSESSMENT:   61 y.o. male presented to the ED with AMS from assisted living facility. PMH includes GERD. Pt admitted with DKA (new diagnosed DM) and AKI. Later developed an ileus vs SBO, requiring surgery.   9/03 - NGT placed to LIWS 9/05 - Ex lap s/p lysis of adhesions; NGT replaced to LIWS 9/06 - TPN started  9/14 - diet advanced to clear liquids; NGT removed 9/15 - diet advanced to full liquids  9/18 - NPO; NGT placed to LIWS 9/19 - diet advanced to clear liquids 9/20 - NGT removed  9/21 - diet advanced to full liquids   Pt laying in bed, denies any nausea, vomiting or pain while eating. Reports having a BM yesterday but not yet today; per chart, pt had a  BM after RD left. Pt with no questions at this time.   Medications reviewed and include: Doculax, NovoLog, MVI, Protonix Labs reviewed: BUN 24, 24 hr CBG 153-205  UOP: 1700 mL x 24 hours  Diet Order:   Diet Order             Diet regular Room service appropriate? Yes; Fluid consistency: Thin  Diet effective now                   EDUCATION NEEDS:   No education needs have been identified at this time  Skin:  Skin Assessment: Skin Integrity Issues: Skin Integrity Issues:: Incisions Incisions: Abdomen  Last BM:  9/22- Type 6  Height:   Ht Readings from Last 1 Encounters:  04/09/22 5'  11" (1.803 m)    Weight:   Wt Readings from Last 1 Encounters:  05/04/22 101.4 kg   BMI:  Body mass index is 31.18 kg/m.  Estimated Nutritional Needs:  Kcal:  2200-2400 Protein:  110-125 grams Fluid:  >/= 2 L   Hermina Barters RD, LDN Clinical Dietitian See [facility] for contact information.

## 2022-05-04 NOTE — Progress Notes (Signed)
Mobility Specialist - Progress Note   05/04/22 1605  Mobility  Activity Ambulated with assistance in hallway  Level of Assistance Minimal assist, patient does 75% or more  Assistive Device Front wheel walker  Distance Ambulated (ft) 200 ft  Activity Response Tolerated well  $Mobility charge 1 Mobility   Pt received in bed and agreeable. No complaints throughout. Pt returned to bed with all needs met.  Larey Seat

## 2022-05-04 NOTE — Progress Notes (Signed)
PHARMACY - TOTAL PARENTERAL NUTRITION CONSULT NOTE   Indication: Small bowel obstruction/prolonged ileus  Patient Measurements: Height: 5\' 11"  (180.3 cm) Weight: 101.4 kg (223 lb 8.7 oz) IBW/kg (Calculated) : 75.3 TPN AdjBW (KG): 82.3 Body mass index is 31.18 kg/m. Usual Weight: 100-103 kg  Assessment:  61 yo M with newly diagnosed type 2 diabetes. Patient developed ileus 9/1 which then evolved into SBO now s/p ex lap 04/17/22. Pt was eating ~50% of dysphagia/carb diet from 8/28-9/1. NPO since 04/14/22. Anticipate prolonged ileus - pharmacy consulted for TPN.   Overnight 9/17, patient reported to have had 2 episodes of large-volume emesis. Stat XR of abd showed worsening of dilated small bowels concerning for developing obstruction versus ileus. Patient placed back on NPO with NG tube on suction.    Glucose / Insulin: A1c 11.7%, BG 149-205; used 13u SSI in 24h Electrolytes: Na 135, K 3.7 (s/p Kcl 40 meq), CoCa 9.6. Other electrolytes wnl.  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 9/14 NGT removed   Central access: PICC 04/17/22 TPN start date: 04/18/22  Nutritional Goals: Goal TPN rate is 90 mL/hr (provides 129 g of protein, 73g lipid, 296g dextrose, and 2257 kcals per day)  RD Assessment: Estimated Needs Total Energy Estimated Needs: 2200-2400 Total  Protein Estimated Needs: 110-125 grams Total Fluid Estimated Needs: >/= 2 L  Current Nutrition:  TPN 9/14 CLD 9/15 FLD- ate some soup, no ensure; No TPN given d/t compounder broken down  9/16 FLD- had 1 ensure, 1 soup, 1 pudding; had emesis x1 9/17 FLD- working on ensure 9/18 back to NPO following large emesis episode overnight 9/19 CLD 9/20 CLD- had several juices, jello, broth > all tolerated well 9/21 FLD   Plan:  Continue TPN to cycle over 14 hours (83 - 166 mL/hr, GIR 1.94-3.88 mg/kg/min), provides 123 g protein, 2227 kcal, providing 100% of needs Electrolytes in TPN: Continue Na 75 mEq/L, K  25 mEq/L, Ca 0 mEq/L, Mg 4 mEq/L, Phos 5 mmol/L; Cl:Ac 1:1 Continue standard MVI to PO, removed from TPN  Continue mSSI to Q6h while NPO and adjust as needed Increase regular insulin to 73 units in TPN  Monitor TPN labs Mon/Thurs, and as indicated F/u PO intake, wean TPN as able - plan to wean if tolerating diet advancement today per surgery   Thank you for allowing pharmacy to be a part of this patient's care.  Cristela Felt, PharmD, BCPS Clinical Pharmacist 05/04/2022 9:16 AM

## 2022-05-04 NOTE — Plan of Care (Signed)

## 2022-05-04 NOTE — Progress Notes (Signed)
 [facility] PROGRESS NOTE   Cody [name] MRN:[id number] DOB: 03/09/1961 DOA: 04/09/2022  PCP: Johnson, Deborah B, MD  Brief History/Interval Summary:  61 y.o. male with a history of GERD, allergic rhinitis who presented 8/28 from [facility] with confusion found to be in DKA (new diagnosis of diabetes). Neuroimaging showed no stroke. the patient was also hypoxic, diagnosed with aspiration pneumonia and improved on antibiotics. DKA, AKI, encephalopathy all resolved.    He developed abdominal pain and distention, vomiting with SBO shown on CT. Surgery was consulted, performed ex lap with LOA by Dr. Cornett on 9/5 with biopsy suggestive of adhesion.  Patient was started on TPN.  Consultants: General surgery.  Procedures: Exploratory laparotomy  Subjective/Interval History: No acute issues or events overnight denies nausea vomiting diarrhea constipation headache fevers chills or chest pain.  He is currently requesting advancement of diet he is "tired of being fed through tubes"  Assessment/Plan:  Small bowel obstruction/postoperative ileus S/p ex lap, LOA by Dr. Cornett 9/5. Biopsy reassuring with benign dense fibrovascular stroma suggestive of adhesion.  NG tube was pulled out on 9/14 and subsequently reinserted and has now been removed again given improved p.o. intake Discontinue TPN, surgery in agreement to attempt to advance diet as tolerated and follow clinically. If worsening p.o. intake again would likely need to replace NG tube so we can advance trickle feeds to improve bowel regimen General surgery following, appreciate insight recommendations   T2DM newly diagnosed presenting with DKA, hyperglycemia A1c 11.7%. C-peptide low at 0.6, consider recheck/stim testing outside scope of DKA/acute illness.  Was on long-acting insulin.  However due to small bowel obstruction and n.p.o. status this was discontinued.  Resume insulin pending p.o. intake   Acute hypoxic  respiratory failure/aspiration pneumonia/sepsis present on admission Initially related to aspiration pneumonia and sepsis.  TTE with normal LVEF and G1 DD. Patient was diuresed. CT scan did not show any PE.  There were opacities suggestive of infiltrate versus edema.  He was maintained on once a day furosemide.  His BUN is noted to be elevated and he is a little bit tachycardic which could be suggestive of hypovolemia.  We will hold his Lasix for now.  Monitor volume status closely. Continue with incentive spirometry.  Saturating normal on room air.  Hypokalemia Follow with morning labs  Nutrition Advance diet per patient request today, follow clinically   Hyperlipidemia LDL 151.  Resume statin at discharge.   Acute kidney injury In the setting of DKA.  Resolved.   Acute metabolic encephalopathy/strokelike symptoms CVA work-up unrevealing.  EEG unremarkable. Resolved.   Abnormal LFTs Mildly elevated AST and ALT noted.  No abnormality in the hepatobiliary system on CT scan.  LFTs are stable for the most part.  Hepatitis panel is unremarkable.   Acute blood loss anemia Drop in hemoglobin is likely dilutional.  No evidence of overt blood loss.   Sinus tachycardia Noted to be tachycardic this morning though asymptomatic.  He has been noted to be tachycardic, most of this admission.  TSH was noted to be 0.2 on 8/28 but free T4 was normal at 0.76.  IV metoprolol as needed is ordered.  Since he is asymptomatic for the most part we will continue to monitor for now.  Tachycardia probably related to his acute illness.  Could also be due to a degree of hypovolemia as he is on daily Lasix.  We will hold his furosemide for now as discussed above.   GERD: Continue PPI     Mild hypernatremia/hypercalcemia Likely from dehydration.  Resolved.   Tobacco use disorder Encourage cessation. Continue nicotine patch   Generalized weakness/physical deconditioning PT/OT recommended return to SNF for  further therapy.  Continue to mobilize.  Out of bed to chair.   Obesity Estimated body mass index is 31.18 kg/m as calculated from the following:   Height as of this encounter: 5' 11" (1.803 m).   Weight as of this encounter: 101.4 kg.   DVT Prophylaxis: Lovenox Code Status: Full code Family Communication: Discussed with patient.  No family at bedside Disposition Plan: Back to SNF once medically indicated and p.o. intake improving   Status is: Inpatient Remains inpatient appropriate because: Small bowel obstruction  Medications: Scheduled:  bisacodyl  10 mg Rectal Daily   Chlorhexidine Gluconate Cloth  6 each Topical Daily   enoxaparin (LOVENOX) injection  50 mg Subcutaneous Q24H   feeding supplement  1 Container Oral TID BM   insulin aspart  0-15 Units Subcutaneous Q6H   insulin starter kit- pen needles  1 kit Other Once   multivitamin with minerals  1 tablet Oral Daily   pantoprazole (PROTONIX) IV  40 mg Intravenous Q24H   sodium chloride flush  10-40 mL Intracatheter Q12H   Continuous:  sodium chloride 10 mL/hr at 05/03/22 0635   YYQ:MGNOIB chloride, acetaminophen, alum & mag hydroxide-simeth, dextrose, morphine injection, nicotine, ondansetron (ZOFRAN) IV, mouth rinse, oxyCODONE, phenol, sodium chloride flush  Antibiotics: Anti-infectives (From admission, onward)    Start     Dose/Rate Route Frequency Ordered Stop   04/17/22 1100  ceFAZolin (ANCEF) IVPB 2g/100 mL premix        2 g 200 mL/hr over 30 Minutes Intravenous On call to O.R. 04/17/22 1045 04/17/22 1150   04/09/22 1100  Ampicillin-Sulbactam (UNASYN) 3 g in sodium chloride 0.9 % 100 mL IVPB  Status:  Discontinued        3 g 200 mL/hr over 30 Minutes Intravenous Every 8 hours 04/09/22 1045 04/14/22 1419       Objective:  Vital Signs  Vitals:   05/02/22 2031 05/03/22 0430 05/03/22 2028 05/04/22 0435  BP: 126/83 120/73 114/70 108/79  Pulse: (!) 113 (!) 101 100 91  Resp: _0 Temp: 98.6 F (37  C) 98.2 F (36.8 C) 98.6 F (37 C) 98.7 F (37.1 C)  TempSrc: Oral Oral Oral Oral  SpO2: 94% 94% 91% 90%  Weight:  99 kg  101.4 kg  Height:        Intake/Output Summary (Last 24 hours) at 05/04/2022 0808 Last data filed at 05/04/2022 0438 Gross per 24 hour  Intake 464.88 ml  Output 1700 ml  Net -1235.12 ml    Filed Weights   05/02/22 0401 05/03/22 0430 05/04/22 0435  Weight: 95.8 kg 99 kg 101.4 kg    General appearance: Awake alert.  In no distress Resp: Clear to auscultation bilaterally.  Normal effort Cardio: S1-S2 is tachycardic regular.  No S3-S4.  No rubs murmurs or bruit GI: Abdomen is soft.  Bowel sounds.  Nontender. Extremities: No edema.  Full range of motion of lower extremities.       Lab Results:  Data Reviewed: I have personally reviewed following labs and reports of the imaging studies  CBC: Recent Labs  Lab 04/27/22 1315 04/30/22 0518 05/01/22 0549 05/03/22 0443  WBC 15.3* 12.5* 12.0* 11.4*  HGB 10.5* 9.6* 9.9* 10.3*  HCT 31.5* 29.4* 30.9* 31.1*  MCV 104.3* 105.0* 106.6* 105.8*  PLT 334 331 335  347     Basic Metabolic Panel: Recent Labs  Lab 04/28/22 0235 04/30/22 0518 05/01/22 0549 05/03/22 0443  NA 138 134* 137 137  K 4.0 4.0 3.8 3.2*  CL 103 103 106 106  CO2 _0 GLUCOSE 121* 204* 292* 184*  BUN 23 30* 27* 28*  CREATININE 0.74 0.87 0.83 0.79  CALCIUM 9.4 8.9 8.6* 9.1  MG 2.1 2.1  --  2.2  PHOS 3.4 2.5  --  3.0     GFR: Estimated Creatinine Clearance: 116.1 mL/min (by C-G formula based on SCr of 0.79 mg/dL).  Liver Function Tests: Recent Labs  Lab 04/30/22 0518 05/03/22 0443  AST 65* 21  ALT 188* 86*  ALKPHOS 130* 108  BILITOT 0.3 0.3  PROT 6.9 6.8  ALBUMIN 2.9* 3.0*      CBG: Recent Labs  Lab 05/03/22 0620 05/03/22 1310 05/03/22 1838 05/04/22 0013 05/04/22 0652  GLUCAP 199* 153* 149* 196* 205*     Lipid Profile: Recent Labs    05/03/22 0443  TRIG 327*      Radiology Studies: No  results found.     LOS: 25 days   Little Ishikawa  [facility] Pager on [email or web address]  05/04/2022, 8:08 AM

## 2022-05-04 NOTE — Progress Notes (Signed)
17 Days Post-Op   Subjective/Chief Complaint: Passing flatus and had a bowel movement yesterday. No nausea but hiccups while I am in the room. Had some full liquids yesterday but none so far this am. Denies abdominal pain  Objective: Vital signs in last 24 hours: Temp:  [98.6 F (37 C)-98.7 F (37.1 C)] 98.7 F (37.1 C) (09/22 0435) Pulse Rate:  [91-100] 91 (09/22 0435) Resp:  [18] 18 (09/22 0435) BP: (108-114)/(70-79) 108/79 (09/22 0435) SpO2:  [90 %-91 %] 90 % (09/22 0435) Weight:  [101.4 kg] 101.4 kg (09/22 0435) Last BM Date : 05/03/22  Intake/Output from previous day: 09/21 0701 - 09/22 0700 In: 545.1 [I.V.:145.1; IV Piggyback:400] Out: 1700 [Urine:1700] Intake/Output this shift: No intake/output data recorded.  PE: GI: soft. Mild distension. Active bowel sounds. NT. Abd binder in place - removed for exam. Incision with steri strips. No discharge or erythema   Lab Results:  Recent Labs    05/03/22 0443  WBC 11.4*  HGB 10.3*  HCT 31.1*  PLT 347    BMET Recent Labs    05/03/22 0443  NA 137  K 3.2*  CL 106  CO2 22  GLUCOSE 184*  BUN 28*  CREATININE 0.79  CALCIUM 9.1    PT/INR No results for input(s): "LABPROT", "INR" in the last 72 hours. ABG No results for input(s): "PHART", "HCO3" in the last 72 hours.  Invalid input(s): "PCO2", "PO2"  Studies/Results: No results found.  Anti-infectives: Anti-infectives (From admission, onward)    Start     Dose/Rate Route Frequency Ordered Stop   04/17/22 1100  ceFAZolin (ANCEF) IVPB 2g/100 mL premix        2 g 200 mL/hr over 30 Minutes Intravenous On call to O.R. 04/17/22 1045 04/17/22 1150   04/09/22 1100  Ampicillin-Sulbactam (UNASYN) 3 g in sodium chloride 0.9 % 100 mL IVPB  Status:  Discontinued        3 g 200 mL/hr over 30 Minutes Intravenous Every 8 hours 04/09/22 1045 04/14/22 1419       Assessment/Plan: POD 17, s/p Exploratory laparotomy, lysis of adhesions for SBO by Dr. Brantley Stage, 04/17/22 -  tolerating FLD and having bowel function. Hiccups this am. Continue FLD for breakfast and advance to soft if tolerated - staples out 9/20 - cont reglan - continue abdominal binder - afebrile.  WBC down to 11.4 9/21.  CT scan on Friday with no intra-abdominal process or infection - Cont TPN - can wean once tolerating soft diet - Bx Mesentery of SB reassuring with benign dense fibrovascular stroma compatible with an adhesion - Mobilize for bowel function, PT. Currently recommending SNF. Plan is to return to [facility] at d/c.  - Pulm toilet  FEN - FLD ADAT soft, TPN, IVF per TRH. VTE - SCDs; Lovenox (hgb stable) ID - None currently. Afebrile. WBC downtrending   - Per TRH -  New dx DM (DKA on presentation) AKI - resolved PNA - completed abx   LOS: 25 days   Winferd Humphrey, [facility] 05/04/2022, 8:01 AM Please see Amion for pager number during day hours 7:00am-4:30pm

## 2022-05-04 NOTE — Progress Notes (Signed)
PT Cancellation Note  Patient Details Name: Cody [name] MRN: [id number] DOB: 07-23-1961   Cancelled Treatment:    Reason Eval/Treat Not Completed: Patient declined, no reason specified. Pt declines mobility at this time despite PT encouragement, reporting he "doesn't feel like it". PT will return as time allows.   Zenaida Niece 05/04/2022, 3:25 PM

## 2022-05-05 DIAGNOSIS — E1310 Other specified diabetes mellitus with ketoacidosis without coma: Secondary | ICD-10-CM | POA: Diagnosis not present

## 2022-05-05 LAB — BASIC METABOLIC PANEL
Anion gap: 9 (ref 5–15)
BUN: 17 mg/dL (ref 8–23)
CO2: 23 mmol/L (ref 22–32)
Calcium: 9.6 mg/dL (ref 8.9–10.3)
Chloride: 104 mmol/L (ref 98–111)
Creatinine, Ser: 0.69 mg/dL (ref 0.61–1.24)
GFR, Estimated: >60 mL/min (ref >60)
Glucose, Bld: 114 mg/dL — ABNORMAL HIGH (ref 70–99)
Potassium: 3.7 mmol/L (ref 3.5–5.1)
Sodium: 136 mmol/L (ref 135–145)

## 2022-05-05 LAB — GLUCOSE, CAPILLARY
Glucose-Capillary: 114 mg/dL — ABNORMAL HIGH (ref 70–99)
Glucose-Capillary: 114 mg/dL — ABNORMAL HIGH (ref 70–99)
Glucose-Capillary: 115 mg/dL — ABNORMAL HIGH (ref 70–99)
Glucose-Capillary: 125 mg/dL — ABNORMAL HIGH (ref 70–99)
Glucose-Capillary: 126 mg/dL — ABNORMAL HIGH (ref 70–99)
Glucose-Capillary: 128 mg/dL — ABNORMAL HIGH (ref 70–99)
Glucose-Capillary: 133 mg/dL — ABNORMAL HIGH (ref 70–99)
Glucose-Capillary: 158 mg/dL — ABNORMAL HIGH (ref 70–99)

## 2022-05-05 MED ORDER — PANTOPRAZOLE SODIUM 40 MG PO TBEC
40.0000 mg | DELAYED_RELEASE_TABLET | Freq: Every day | ORAL | Status: DC
  Administered 2022-05-05: 40 mg via ORAL
  Filled 2022-05-05: qty 1

## 2022-05-05 MED ORDER — POLYETHYLENE GLYCOL 3350 17 G PO PACK
17.0000 g | PACK | Freq: Every day | ORAL | Status: DC
  Filled 2022-05-05: qty 1

## 2022-05-05 NOTE — Progress Notes (Signed)
[facility] PROGRESS NOTE   Cody [name] UXN:[id number] DOB: November 15, 1960 DOA: 04/09/2022  PCP: Ladell Pier, MD  Brief History/Interval Summary:  61 y.o. male with a history of GERD, allergic rhinitis who presented 8/28 from [facility] with confusion found to be in DKA (new diagnosis of diabetes). Neuroimaging showed no stroke. the patient was also hypoxic, diagnosed with aspiration pneumonia and improved on antibiotics. DKA, AKI, encephalopathy all resolved.    He developed abdominal pain and distention, vomiting with SBO shown on CT. Surgery was consulted, performed ex lap with LOA by Dr. Brantley Stage on 9/5 with biopsy suggestive of adhesion.  Patient was started on TPN.  Consultants: General surgery.  Procedures: Exploratory laparotomy  Subjective/Interval History: No acute issues or events overnight denies nausea vomiting diarrhea constipation headache fevers chills or chest pain.  He is currently requesting advancement of diet he is "tired of being fed through tubes"  Assessment/Plan:  Small bowel obstruction/postoperative ileus -S/p ex lap, LOA by Dr. Brantley Stage 9/5. Biopsy reassuring with benign dense fibrovascular stroma suggestive of adhesion.  -NG tube was pulled out on 9/14 and subsequently reinserted and has now been removed again given improved p.o. intake -Discontinue TPN, surgery in agreement to attempt to advance diet as tolerated and follow clinically. -Tolerating PO well for now - minimally distended abdomen but non tender 3+ BM yesterday per patient - staff corroborated. -General surgery following, appreciate insight recommendations   T2DM newly diagnosed presenting with DKA, hyperglycemia -A1c 11.7%. C-peptide low at 0.6, consider recheck/stim testing outside scope of DKA/acute illness.  -Was on long-acting insulin.  However due to small bowel obstruction and n.p.o. status this was discontinued.  -Continue insulin pending p.o. intake - currently well  controlled   Acute hypoxic respiratory failure/aspiration pneumonia/sepsis present on admission Initially related to aspiration pneumonia and sepsis.  TTE with normal LVEF and G1 DD. Patient was diuresed. CT scan did not show any PE.  There were opacities suggestive of infiltrate versus edema.  He was maintained on once a day furosemide.  His BUN is noted to be elevated and he is a little bit tachycardic which could be suggestive of hypovolemia.  We will hold his Lasix for now.  Monitor volume status closely. Continue with incentive spirometry.  Saturating normal on room air.  Hypokalemia Follow with morning labs  Nutrition Advance diet per patient request today, follow clinically   Hyperlipidemia LDL 151.  Resume statin at discharge.   Acute kidney injury In the setting of DKA.  Resolved.   Acute metabolic encephalopathy/strokelike symptoms CVA work-up unrevealing.  EEG unremarkable. Resolved.   Abnormal LFTs Mildly elevated AST and ALT noted.  No abnormality in the hepatobiliary system on CT scan.  LFTs are stable for the most part.  Hepatitis panel is unremarkable.   Acute blood loss anemia Drop in hemoglobin is likely dilutional.  No evidence of overt blood loss.   Sinus tachycardia Noted to be tachycardic this morning though asymptomatic.  He has been noted to be tachycardic, most of this admission.  TSH was noted to be 0.2 on 8/28 but free T4 was normal at 0.76.  IV metoprolol as needed is ordered.  Since he is asymptomatic for the most part we will continue to monitor for now.  Tachycardia probably related to his acute illness.  Could also be due to a degree of hypovolemia as he is on daily Lasix.  We will hold his furosemide for now as discussed above.   GERD: Continue  PPI   Mild hypernatremia/hypercalcemia Likely from dehydration.  Resolved.   Tobacco use disorder Encourage cessation. Continue nicotine patch   Generalized weakness/physical deconditioning PT/OT  recommended return to SNF for further therapy.  Continue to mobilize.  Out of bed to chair.   Obesity Estimated body mass index is 30.41 kg/m as calculated from the following:   Height as of this encounter: '5\' 11"'  (1.803 m).   Weight as of this encounter: 98.9 kg.   DVT Prophylaxis: Lovenox Code Status: Full code Family Communication: Discussed with patient.  No family at bedside Disposition Plan: Back to SNF once medically indicated and p.o. intake improving   Status is: Inpatient Remains inpatient appropriate because: Small bowel obstruction  Medications: Scheduled:  bisacodyl  10 mg Rectal Daily   Chlorhexidine Gluconate Cloth  6 each Topical Daily   enoxaparin (LOVENOX) injection  50 mg Subcutaneous Q24H   feeding supplement  237 mL Oral BID BM   insulin aspart  0-15 Units Subcutaneous Q6H   insulin starter kit- pen needles  1 kit Other Once   multivitamin with minerals  1 tablet Oral Daily   pantoprazole (PROTONIX) IV  40 mg Intravenous Q24H   sodium chloride flush  10-40 mL Intracatheter Q12H   Continuous:  sodium chloride 10 mL/hr at 05/03/22 0635   ZOX:WRUEAV chloride, acetaminophen, alum & mag hydroxide-simeth, dextrose, morphine injection, nicotine, ondansetron (ZOFRAN) IV, mouth rinse, oxyCODONE, phenol, sodium chloride flush  Antibiotics: Anti-infectives (From admission, onward)    Start     Dose/Rate Route Frequency Ordered Stop   04/17/22 1100  ceFAZolin (ANCEF) IVPB 2g/100 mL premix        2 g 200 mL/hr over 30 Minutes Intravenous On call to O.R. 04/17/22 1045 04/17/22 1150   04/09/22 1100  Ampicillin-Sulbactam (UNASYN) 3 g in sodium chloride 0.9 % 100 mL IVPB  Status:  Discontinued        3 g 200 mL/hr over 30 Minutes Intravenous Every 8 hours 04/09/22 1045 04/14/22 1419       Objective:  Vital Signs  Vitals:   05/04/22 1025 05/04/22 2024 05/05/22 0358 05/05/22 0637  BP: 108/77 90/65 (P) 107/62   Pulse: 95 96 (P) 91 86  Resp: 18 18 (P) 19    Temp:  98.2 F (36.8 C) (P) 98.7 F (37.1 C)   TempSrc:  Oral (P) Oral   SpO2:  97%  94%  Weight:    98.9 kg  Height:        Intake/Output Summary (Last 24 hours) at 05/05/2022 0815 Last data filed at 05/05/2022 4098 Gross per 24 hour  Intake 480 ml  Output 390 ml  Net 90 ml    Filed Weights   05/03/22 0430 05/04/22 0435 05/05/22 0637  Weight: 99 kg 101.4 kg 98.9 kg    General appearance: Awake alert.  In no distress Resp: Clear to auscultation bilaterally.  Normal effort Cardio: S1-S2 is tachycardic regular.  No S3-S4.  No rubs murmurs or bruit GI: Abdomen is soft.  Bowel sounds.  Nontender. Extremities: No edema.  Full range of motion of lower extremities.   Lab Results:  Data Reviewed: I have personally reviewed following labs and reports of the imaging studies  CBC: Recent Labs  Lab 04/30/22 0518 05/01/22 0549 05/03/22 0443  WBC 12.5* 12.0* 11.4*  HGB 9.6* 9.9* 10.3*  HCT 29.4* 30.9* 31.1*  MCV 105.0* 106.6* 105.8*  PLT 331 335 347     Basic Metabolic Panel: Recent Labs  Lab  04/30/22 0518 05/01/22 0549 05/03/22 0443 05/04/22 0746 05/05/22 0356  NA 134* 137 137 135 136  K 4.0 3.8 3.2* 3.7 3.7  CL 103 106 106 106 104  CO2 '23 23 22 23 23  ' GLUCOSE 204* 292* 184* 183* 114*  BUN 30* 27* 28* 24* 17  CREATININE 0.87 0.83 0.79 0.74 0.69  CALCIUM 8.9 8.6* 9.1 8.8* 9.6  MG 2.1  --  2.2  --   --   PHOS 2.5  --  3.0  --   --      GFR: Estimated Creatinine Clearance: 114.7 mL/min (by C-G formula based on SCr of 0.69 mg/dL).  Liver Function Tests: Recent Labs  Lab 04/30/22 0518 05/03/22 0443  AST 65* 21  ALT 188* 86*  ALKPHOS 130* 108  BILITOT 0.3 0.3  PROT 6.9 6.8  ALBUMIN 2.9* 3.0*      CBG: Recent Labs  Lab 05/04/22 1615 05/04/22 1814 05/05/22 0004 05/05/22 0548 05/05/22 0752  GLUCAP 110* 147* 114* 115* 125*     Lipid Profile: Recent Labs    05/03/22 0443  TRIG 327*      Radiology Studies: No results found.      LOS: 26 days   [facility] Pager on [email or web address]  05/05/2022, 8:15 AM

## 2022-05-05 NOTE — Progress Notes (Cosign Needed Addendum)
18 Days Post-Op  Subjective: CC: Doing well. Reports no abdominal pain, bloating, n/v. Tolerated 2 shakes, all of breakfast, and a little less than half of lunch and dinner yesterday. Oob with mobility tech yesterday. No prn pain or antinausea medication yesterday. Reports he voided this am.   Objective: Vital signs in last 24 hours: Temp:  [98.2 F (36.8 C)-98.7 F (37.1 C)] (P) 98.7 F (37.1 C) (09/23 0358) Pulse Rate:  [86-96] 86 (09/23 0637) Resp:  [18-19] (P) 19 (09/23 0358) BP: (90)/(65) (P) 107/62 (09/23 0358) SpO2:  [94 %-97 %] 94 % (09/23 0637) Weight:  [98.9 kg] 98.9 kg (09/23 0637) Last BM Date : 05/04/22  Intake/Output from previous day: 09/22 0701 - 09/23 0700 In: 480 [P.O.:480] Out: 390 [Urine:390] Intake/Output this shift: No intake/output data recorded.  PE: Gen:  Alert, NAD GI: Soft. Some distension, improved from when I last saw him. Active bowel sounds. NT. Abd binder in place - removed for exam. Incision with steri strips. No discharge or erythema  Lab Results:  Recent Labs    05/03/22 0443  WBC 11.4*  HGB 10.3*  HCT 31.1*  PLT 347   BMET Recent Labs    05/04/22 0746 05/05/22 0356  NA 135 136  K 3.7 3.7  CL 106 104  CO2 23 23  GLUCOSE 183* 114*  BUN 24* 17  CREATININE 0.74 0.69  CALCIUM 8.8* 9.6   PT/INR No results for input(s): "LABPROT", "INR" in the last 72 hours. CMP     Component Value Date/Time   NA 136 05/05/2022 0356   NA 142 11/15/2017 1646   K 3.7 05/05/2022 0356   CL 104 05/05/2022 0356   CO2 23 05/05/2022 0356   GLUCOSE 114 (H) 05/05/2022 0356   BUN 17 05/05/2022 0356   BUN 15 11/15/2017 1646   CREATININE 0.69 05/05/2022 0356   CREATININE 1.14 09/27/2016 1532   CALCIUM 9.6 05/05/2022 0356   PROT 6.8 05/03/2022 0443   PROT 7.3 10/07/2017 0954   ALBUMIN 3.0 (L) 05/03/2022 0443   ALBUMIN 4.4 10/07/2017 0954   AST 21 05/03/2022 0443   ALT 86 (H) 05/03/2022 0443   ALKPHOS 108 05/03/2022 0443   BILITOT 0.3  05/03/2022 0443   BILITOT 0.6 10/07/2017 0954   GFRNONAA >60 05/05/2022 0356   GFRNONAA 71 09/27/2016 1532   GFRAA >60 01/18/2018 0526   GFRAA 82 09/27/2016 1532   Lipase  No results found for: "LIPASE"  Studies/Results: No results found.  Anti-infectives: Anti-infectives (From admission, onward)    Start     Dose/Rate Route Frequency Ordered Stop   04/17/22 1100  ceFAZolin (ANCEF) IVPB 2g/100 mL premix        2 g 200 mL/hr over 30 Minutes Intravenous On call to O.R. 04/17/22 1045 04/17/22 1150   04/09/22 1100  Ampicillin-Sulbactam (UNASYN) 3 g in sodium chloride 0.9 % 100 mL IVPB  Status:  Discontinued        3 g 200 mL/hr over 30 Minutes Intravenous Every 8 hours 04/09/22 1045 04/14/22 1419        Assessment/Plan POD 18 s/p Exploratory laparotomy, lysis of adhesions for SBO by Dr. Luisa Hart, 04/17/22 - Tolerating regular diet +shakes without n/v and having bowel function. Per prior notes, on reglan. Appears TPN is off now.  - Staples out 9/20 - Continue abdominal binder - CT scan on 9/15 with no intra-abdominal process or infection. WBC down on last check. Afebrile. - Bx Mesentery of SB reassuring with  benign dense fibrovascular stroma compatible with an adhesion - Mobilize for bowel function, PT. Currently recommending SNF. Plan is to return to [facility] at d/c. Can start working towards this   [place], TPN off, IVF per TRH. VTE - SCDs; Lovenox  ID - None currently.   - Per TR[facility] -  New dx DM (DKA on presentation) AKI - resolved PNA - completed abx   LOS: 26 days    Cody [name] , [facility] 05/05/2022, 11:15 AM Please see Amion for pager number during day hours 7:00am-4:30pm

## 2022-05-06 DIAGNOSIS — K56609 Unspecified intestinal obstruction, unspecified as to partial versus complete obstruction: Secondary | ICD-10-CM | POA: Diagnosis not present

## 2022-05-06 DIAGNOSIS — E1110 Type 2 diabetes mellitus with ketoacidosis without coma: Secondary | ICD-10-CM | POA: Diagnosis not present

## 2022-05-06 LAB — GLUCOSE, CAPILLARY
Glucose-Capillary: 131 mg/dL — ABNORMAL HIGH (ref 70–99)
Glucose-Capillary: 110 mg/dL — ABNORMAL HIGH (ref 70–99)

## 2022-05-06 MED ORDER — ACETAMINOPHEN 325 MG PO TABS: 650.0000 mg | ORAL_TABLET | Freq: Four times a day (QID) | ORAL | 0 refills | Status: AC | PRN

## 2022-05-06 MED ORDER — INSULIN STARTER KIT- PEN NEEDLES (ENGLISH): 1.0000 | Freq: Once | 0 refills | Status: AC

## 2022-05-06 MED ORDER — POLYETHYLENE GLYCOL 3350 17 G PO PACK: 17.0000 g | PACK | Freq: Every day | ORAL | 0 refills | Status: DC

## 2022-05-06 MED ORDER — ENSURE ENLIVE PO LIQD: 237.0000 mL | Freq: Two times a day (BID) | ORAL | 12 refills | Status: DC

## 2022-05-06 MED ORDER — INSULIN GLARGINE 100 UNIT/ML SOLOSTAR PEN: 10.0000 [IU] | PEN_INJECTOR | Freq: Every day | SUBCUTANEOUS | 0 refills | Status: DC

## 2022-05-06 NOTE — Discharge Summary (Addendum)
Physician Discharge Summary  JASON FRISBEE GRR:[id number] DOB: [birth year] DOA: 04/09/2022  PCP: Marcine Matar, MD  Admit date: 04/09/2022 Discharge date: 05/06/2022  Admitted From: SNF Disposition: SNF  Recommendations for Outpatient Follow-up:  Follow up with PCP in 1-2 weeks Follow-up with surgery as scheduled  Discharge Condition: Stable CODE STATUS: Full Diet recommendation: Diabetic diet as tolerated  Brief/Interim Summary: 61 y.o. male with a history of GERD, allergic rhinitis who presented 8/28 from [facility] SNF with confusion found to be in DKA (new diagnosis of diabetes). Neuroimaging showed no stroke. the patient was also hypoxic, diagnosed with aspiration pneumonia and improved on antibiotics. DKA, AKI, encephalopathy all resolved. He developed abdominal pain and distention, vomiting with SBO shown on CT. Surgery was consulted, performed ex lap with LOA by Dr. Luisa Hart on 9/5 with biopsy suggestive of adhesion.  Patient was started on TPN in the interim due to poor p.o. intake even via NG tube.  At this time patient's diet has been able to slowly advance, now tolerating a more normal diabetic diet, discussed at length need for monitoring carbs sugars sweets etc. with patient, new medications outlined as below giving new diabetes diagnosis with DKA at intake close monitoring of patient's glucose is important.  Will discharge patient on Lantus 10 units daily with outpatient to titrate as appropriate, hoping that he can continue strict diabetic diet and improve his A1c over the next few weeks to months.  Discharge Diagnoses:  Principal Problem:   DKA (diabetic ketoacidosis) (HCC) Active Problems:   GERD (gastroesophageal reflux disease)   Acute metabolic encephalopathy   Severe sepsis (HCC)   AKI (acute kidney injury) (HCC)   Dehydration   SIRS (systemic inflammatory response syndrome) (HCC)   Allergic rhinitis   Ileus (HCC)   Acute blood loss anemia  Small bowel  obstruction/postoperative ileus -S/p ex lap, LOA by Dr. Luisa Hart 9/5. Biopsy reassuring with benign dense fibrovascular stroma suggestive of adhesion.  -NG tube was pulled out on 9/14 and subsequently reinserted and has now been removed again given improved p.o. intake -Discontinue TPN, surgery in agreement to attempt to advance diet as tolerated and follow clinically. -Tolerating PO well for now - minimally distended abdomen but non tender 3+ BM yesterday per patient - staff corroborated. -General surgery to follow up outpatient as scheduled   T2DM newly diagnosed presenting with DKA, hyperglycemia -A1c 11.7%. C-peptide low at 0.6, consider recheck/stim testing outside scope of DKA/acute illness.  -Continue lantus 10u daily - follow diabetic diet as discussed   Acute hypoxic respiratory failure/aspiration pneumonia/sepsis present on admission Initially related to aspiration pneumonia and sepsis.  TTE with normal LVEF and G1 DD. Patient was diuresed. CT scan did not show any PE.  There were opacities suggestive of infiltrate versus edema.  He was maintained on once a day furosemide.  His BUN is noted to be elevated and he is a little bit tachycardic which could be suggestive of hypovolemia.  Resume home medications as below   Hypokalemia, resolving Follow with morning labs   Nutrition Advance diet per patient request today, follow clinically   Hyperlipidemia LDL 151.  Resume statin at discharge.   Acute kidney injury In the setting of DKA.  Resolved.   Acute metabolic encephalopathy/strokelike symptoms CVA work-up unrevealing.  EEG unremarkable. Resolved.   Abnormal LFTs Mildly elevated AST and ALT noted.  No abnormality in the hepatobiliary system on CT scan.  LFTs are stable for the most part.  Hepatitis panel is unremarkable.  Acute blood loss anemia Drop in hemoglobin is likely dilutional.  No evidence of overt blood loss.   Sinus tachycardia Noted to be tachycardic this  morning though asymptomatic.  He has been noted to be tachycardic, most of this admission.  TSH was noted to be 0.2 on 8/28 but free T4 was normal at 0.76.  IV metoprolol as needed is ordered.  Since he is asymptomatic for the most part we will continue to monitor for now.  Tachycardia probably related to his acute illness.  Could also be due to a degree of hypovolemia as he is on daily Lasix.  We will hold his furosemide for now as discussed above.   GERD: Continue PPI   Mild hypernatremia/hypercalcemia Likely from dehydration.  Resolved.   Tobacco use disorder Encourage cessation. Continue nicotine patch   Generalized weakness/physical deconditioning PT/OT recommended return to SNF for further therapy.  Continue to mobilize.  Out of bed to chair.   Obesity Estimated body mass index is 30.41 kg/m as calculated from the following:   Height as of this encounter: $RemoveBeforeD'5\' 11"'[id number]$  (1.803 m).   Weight as of this encounter: 98.9 kg.    Discharge Instructions   Allergies as of 05/06/2022   No Known Allergies      Medication List     STOP taking these medications    benzonatate 100 MG capsule Commonly known as: TESSALON   cetirizine 10 MG tablet Commonly known as: ZYRTEC   diclofenac sodium 1 % Gel Commonly known as: Voltaren   diphenhydrAMINE 25 MG tablet Commonly known as: BENADRYL   famotidine 20 MG tablet Commonly known as: PEPCID   methocarbamol 750 MG tablet Commonly known as: Robaxin   omeprazole 20 MG capsule Commonly known as: PRILOSEC       TAKE these medications    acetaminophen 325 MG tablet Commonly known as: TYLENOL Take 2 tablets (650 mg total) by mouth every 6 (six) hours as needed for mild pain or fever.   Cane Misc 1 each by Does not apply route daily.   ergocalciferol 1.25 MG (50000 UT) capsule Commonly known as: VITAMIN D2 Take 50,000 Units by mouth once a week.   feeding supplement Liqd Take 237 mLs by mouth 2 (two) times daily between  meals.   insulin glargine 100 UNIT/ML Solostar Pen Commonly known as: LANTUS Inject 10 Units into the skin daily.   insulin starter kit- pen needles Misc 1 kit by Other route once for 1 dose.   Multivitamin Adult Tabs Take 1 tablet by mouth daily.   niacin 500 MG tablet Commonly known as: VITAMIN B3 Take 500 mg by mouth 2 (two) times daily with a meal.   polyethylene glycol 17 g packet Commonly known as: MIRALAX / GLYCOLAX Take 17 g by mouth daily. Start taking on: May 07, 2022   thiamine 100 MG tablet Commonly known as: VITAMIN B1 Take 1 tablet (100 mg total) by mouth daily.        Follow-up Information     Cornett, Marcello Moores, MD. Schedule an appointment as soon as possible for a visit in 3 week(s).   Specialty: General Surgery Contact information: [address] [place] [address] [phone]                No Known Allergies  Consultations: Gen Sx   Procedures/Studies: DG Abd 1 View  Result Date: 04/30/2022 CLINICAL DATA:  Nasogastric tube placement.  Vomiting. EXAM: ABDOMEN - 1 VIEW COMPARISON:  Radiographs  04/29/2022 and 04/24/2022.  CT 04/27/2022. FINDINGS: Tip of the enteric tube projects over the left subphrenic region consistent with position in the proximal stomach. Persistent diffuse bowel distension which involves the small bowel more than the colon, but most likely represents a postoperative ileus. Midline skin staples are noted. IMPRESSION: Nasogastric tube tip projects over the proximal stomach. Persistent bowel distension favoring a postoperative ileus over a partial small bowel obstruction. Electronically Signed   By: Richardean Sale M.D.   On: 04/30/2022 08:32   DG ABD ACUTE 2+V W 1V CHEST  Result Date: 04/29/2022 CLINICAL DATA:  Nausea and vomiting. EXAM: DG ABDOMEN ACUTE WITH 1 VIEW CHEST COMPARISON:  Chest x-ray 04/09/2022. CT abdomen and pelvis 04/27/2022. FINDINGS: Dilated small bowel loops with air-fluid levels are  again seen throughout the mid and upper abdomen. Small bowel loops measure up to 5.8 cm in diameter and have increased in size. Air seen throughout nondilated colon to the level the rectum. Midline skin staples are again noted. Lungs are clear. No pleural effusion or pneumothorax. Cardiomediastinal silhouette is within normal limits. Right PICC line tip projects over the cavoatrial junction. No acute fractures are seen. IMPRESSION: 1. Dilated small bowel loops have increased in size worrisome for worsening small-bowel obstruction versus ileus. 2. No acute cardiopulmonary process. Electronically Signed   By: Ronney Asters M.D.   On: 04/29/2022 22:27   DG Abd 2 Views  Result Date: 04/29/2022 CLINICAL DATA:  Postoperative ileus. EXAM: ABDOMEN - 2 VIEW COMPARISON:  CT abdomen pelvis, 04/27/2022. Abdominal radiographs, 04/24/2022. FINDINGS: Gas-filled loops of prominent small bowel are notably less dilated than on the CT from 04/27/2022. Residual contrast is noted in a normal caliber colon. Midline skin staples are stable. No free air or bowel air-fluid levels. IMPRESSION: 1. Significant improvement. Partly air-filled loops of prominent small bowel without overt small bowel dilation, significantly decreased in caliber compared to the CT from 2 days ago. Electronically Signed   By: Lajean Manes M.D.   On: 04/29/2022 10:55   CT ABDOMEN PELVIS W CONTRAST  Result Date: 04/27/2022 CLINICAL DATA:  Abdominal pain and leukocytosis status post lysis of adhesions for small-bowel obstruction 10 days ago. EXAM: CT ABDOMEN AND PELVIS WITH CONTRAST TECHNIQUE: Multidetector CT imaging of the abdomen and pelvis was performed using the standard protocol following bolus administration of intravenous contrast. RADIATION DOSE REDUCTION: This exam was performed according to the departmental dose-optimization program which includes automated exposure control, adjustment of the mA and/or kV according to patient size and/or use of  iterative reconstruction technique. CONTRAST:  63mL OMNIPAQUE IOHEXOL 350 MG/ML SOLN COMPARISON:  CT abdomen pelvis dated April 14, 2022. FINDINGS: Lower chest: Bibasilar atelectasis. Hepatobiliary: No focal liver abnormality is seen. No gallstones, gallbladder wall thickening, or biliary dilatation. Pancreas: Unremarkable. No pancreatic ductal dilatation or surrounding inflammatory changes. Spleen: Normal in size without focal abnormality. Adrenals/Urinary Tract: Adrenal glands are unremarkable. Kidneys are normal, without renal calculi, focal lesion, or hydronephrosis. Bladder is unremarkable. Stomach/Bowel: The stomach is within normal limits. Mildly improved but continued mildly dilated loops of small bowel, with gradual transition to nondilated ileum. The distal and terminal ileum remain sharply decompressed. No bowel wall thickening or surrounding inflammatory changes. The colon is unremarkable. Normal appendix. Vascular/Lymphatic: Aortic atherosclerosis. No enlarged abdominal or pelvic lymph nodes. Reproductive: Prostate is unremarkable. Other: Unchanged small fat containing bilateral inguinal hernias. No free fluid or pneumoperitoneum. Musculoskeletal: No acute or significant osseous findings. IMPRESSION: 1. No intra-abdominal fluid collection. 2. Mildly improved but continued  multiple mildly dilated loops of small bowel without discrete transition point identified. The distal and terminal ileum remain sharply decompressed. Findings could reflect ileus or ongoing partial small bowel obstruction. 3. Aortic Atherosclerosis (ICD10-I70.0). Electronically Signed   By: Titus Dubin M.D.   On: 04/27/2022 15:50   DG Abd Portable 1V  Result Date: 04/24/2022 CLINICAL DATA:  Ileus following GI surgery EXAM: PORTABLE ABDOMEN - 1 VIEW COMPARISON:  04/21/2022 FINDINGS: NG tube in the mid body of the stomach. Gas in nondilated large and small bowel loops similar to the prior study. Skin staples in the midline.  IMPRESSION: NG tube body the stomach.  Mild ileus unchanged. Electronically Signed   By: Franchot Gallo M.D.   On: 04/24/2022 11:47   VAS [place] LOWER EXTREMITY VENOUS (DVT)  Result Date: 04/23/2022  Lower Venous DVT Study Patient Name:  ADLER CHARTRAND [facility]  Date of Exam:   04/22/2022 Medical Rec #: [id number]       Accession #:    [id number] Date of Birth: [birth year]       Patient Gender: M Patient Age:   67 years Exam Location:  [facility] Procedure:      VAS [place] LOWER EXTREMITY VENOUS (DVT) Referring Phys: Vance Gather --------------------------------------------------------------------------------  Indications: Edema, and elevated d-dimer.  Comparison Study: No prior study Performing Technologist: Sharion Dove RVS  Examination Guidelines: A complete evaluation includes B-mode imaging, spectral Doppler, color Doppler, and power Doppler as needed of all accessible portions of each vessel. Bilateral testing is considered an integral part of a complete examination. Limited examinations for reoccurring indications may be performed as noted. The reflux portion of the exam is performed with the patient in reverse Trendelenburg.  +---------+---------------+---------+-----------+----------+--------------+ RIGHT    CompressibilityPhasicitySpontaneityPropertiesThrombus Aging +---------+---------------+---------+-----------+----------+--------------+ CFV      Full           Yes      Yes                                 +---------+---------------+---------+-----------+----------+--------------+ SFJ      Full                                                        +---------+---------------+---------+-----------+----------+--------------+ FV Prox  Full                                                        +---------+---------------+---------+-----------+----------+--------------+ FV Mid   Full                                                         +---------+---------------+---------+-----------+----------+--------------+ FV DistalFull                                                        +---------+---------------+---------+-----------+----------+--------------+  PFV      Full                                                        +---------+---------------+---------+-----------+----------+--------------+ POP      Full           Yes      Yes                                 +---------+---------------+---------+-----------+----------+--------------+ PTV      Full                                                        +---------+---------------+---------+-----------+----------+--------------+ PERO     Full                                                        +---------+---------------+---------+-----------+----------+--------------+   +---------+---------------+---------+-----------+----------+--------------+ LEFT     CompressibilityPhasicitySpontaneityPropertiesThrombus Aging +---------+---------------+---------+-----------+----------+--------------+ CFV      Full           Yes      Yes                                 +---------+---------------+---------+-----------+----------+--------------+ SFJ      Full                                                        +---------+---------------+---------+-----------+----------+--------------+ FV Prox  Full                                                        +---------+---------------+---------+-----------+----------+--------------+ FV Mid   Full                                                        +---------+---------------+---------+-----------+----------+--------------+ FV DistalFull                                                        +---------+---------------+---------+-----------+----------+--------------+ PFV      Full                                                         +---------+---------------+---------+-----------+----------+--------------+  POP      Full           Yes      Yes                                 +---------+---------------+---------+-----------+----------+--------------+ PTV      Full                                                        +---------+---------------+---------+-----------+----------+--------------+ PERO     Full                                                        +---------+---------------+---------+-----------+----------+--------------+     Summary: RIGHT: - There is no evidence of deep vein thrombosis in the lower extremity.  - No cystic structure found in the popliteal fossa.  LEFT: - There is no evidence of deep vein thrombosis in the lower extremity.  - No cystic structure found in the popliteal fossa.  *See table(s) above for measurements and observations. Electronically signed by Monica Martinez MD on 04/23/2022 at 1:47:33 PM.    Final    CT Angio Chest Pulmonary Embolism (PE) W or WO Contrast  Result Date: 04/21/2022 CLINICAL DATA:  Positive D-dimer EXAM: CT ANGIOGRAPHY CHEST WITH CONTRAST TECHNIQUE: Multidetector CT imaging of the chest was performed using the standard protocol during bolus administration of intravenous contrast. Multiplanar CT image reconstructions and MIPs were obtained to evaluate the vascular anatomy. RADIATION DOSE REDUCTION: This exam was performed according to the departmental dose-optimization program which includes automated exposure control, adjustment of the mA and/or kV according to patient size and/or use of iterative reconstruction technique. CONTRAST:  164mL OMNIPAQUE IOHEXOL 350 MG/ML SOLN COMPARISON:  Chest CT 04/09/2022.  CT abdomen and pelvis 04/14/2022 FINDINGS: Cardiovascular: Satisfactory opacification of the pulmonary arteries to the segmental level. No evidence of pulmonary embolism. The heart is enlarged. There is no pericardial effusion. Aorta is normal in size.  Mediastinum/Nodes: There is a 2 cm nodule in the isthmus of the thyroid gland no enlarged mediastinal or hilar lymph nodes. Enteric tube is seen throughout the esophagus. Lungs/Pleura: There is a new small right pleural effusion. There is bilateral lower lobe airspace disease, right greater than left. This appears similar to the prior study. There is new atelectasis in the right middle lobe. There also new patchy ground-glass opacities throughout both lungs. No evidence for pneumothorax. Upper Abdomen: There are dilated fluid-filled small bowel loops in the upper abdomen measuring up to 4.8 cm. Enteric tube tip is in the body of the stomach. Musculoskeletal: No chest wall abnormality. No acute or significant osseous findings. Review of the MIP images confirms the above findings. IMPRESSION: 1. No evidence for pulmonary embolism. 2. New trace right pleural effusion. 3. New patchy bilateral ground-glass opacities may represent edema or infection. 4. Stable bilateral lower lobe consolidation. 5. Dilated small bowel loops in the upper abdomen concerning for small bowel obstruction as seen on comparison CT. 6. 2 cm incidental thyroid nodule with nearby suspicious lymph nodes. Recommend nonemergent thyroid US. Reference: J Am Coll Radiol. 2015 Feb;12(2):  143-50 Electronically Signed   By: Ronney Asters M.D.   On: 04/21/2022 23:00   DG Abd Portable 1V  Result Date: 04/21/2022 CLINICAL DATA:  Nasogastric tube placement. EXAM: PORTABLE ABDOMEN - 1 VIEW COMPARISON:  04/20/2022 FINDINGS: Nasogastric tube tip unchanged in the region of the gastroesophageal junction with side port over the distal esophagus. This could be advanced approximately 8 cm. Remainder of the exam is unchanged. IMPRESSION: Nasogastric tube unchanged with tip over the region of the gastroesophageal junction. This could be advanced approximately 8 cm. Electronically Signed   By: Marin Olp M.D.   On: 04/21/2022 10:43   DG Abd Portable 1V  Result  Date: 04/20/2022 CLINICAL DATA:  Confirm NG tube placement EXAM: PORTABLE ABDOMEN - 1 VIEW COMPARISON:  Radiograph 04/16/2022 FINDINGS: Nasogastric tube side port overlies the distal esophagus. There are multiple dilated small bowel in the abdomen. New midline skin staples. Low lung volumes with bibasilar atelectasis. IMPRESSION: Nasogastric tube side port overlies the distal esophagus, recommend advancement by 9.0 cm. Multiple dilated small bowel in the abdomen, compatible with postoperative ileus. Electronically Signed   By: Maurine Simmering M.D.   On: 04/20/2022 11:37   [place] EKG SITE RITE  Result Date: 04/17/2022 If Site Rite image not attached, placement could not be confirmed due to current cardiac rhythm.  DG Abd Portable 1V  Result Date: 04/16/2022 CLINICAL DATA:  Small bowel obstruction. EXAM: PORTABLE ABDOMEN - 1 VIEW COMPARISON:  April 15, 2022. FINDINGS: Distal tip of nasogastric tube is seen in proximal stomach. Residual contrast is noted in the stomach and proximal duodenum. Mildly dilated small bowel loops are noted concerning for ileus or distal small bowel obstruction. IMPRESSION: Stable small bowel dilatation is noted concerning for distal obstruction or ileus. Electronically Signed   By: Marijo Conception M.D.   On: 04/16/2022 12:42   DG Abd Portable 1V-Small Bowel Obstruction Protocol-initial, 8 hr delay  Result Date: 04/15/2022 CLINICAL DATA:  SBO PROTOCOL-INITIAL, 8 HR DELAY EXAM: PORTABLE ABDOMEN - 1 VIEW COMPARISON:  X-ray abdomen 04/14/2022 FINDINGS: PO contrast remains within the gastric lumen. Enteric tube overlying the gastric lumen. Small bowel gaseous dilatation. No radio-opaque calculi or other significant radiographic abnormality are seen. IMPRESSION: PO contrast remains within the gastric lumen. Small bowel gaseous dilatation. Electronically Signed   By: Iven Finn M.D.   On: 04/15/2022 23:46   DG Abd Portable 1V-Small Bowel Protocol-Position Verification  Result Date:  04/14/2022 CLINICAL DATA:  NG tube placement. EXAM: PORTABLE ABDOMEN - 1 VIEW COMPARISON:  CT earlier today FINDINGS: Tip and side port of the enteric tube below the diaphragm in the stomach. There are persistent dilated loops of small bowel in the central abdomen. IMPRESSION: 1. Tip and side port of the enteric tube below the diaphragm in the stomach. 2. Persistent dilated small bowel in the central abdomen. Electronically Signed   By: Keith Rake M.D.   On: 04/14/2022 23:46   CT ABDOMEN PELVIS W CONTRAST  Result Date: 04/14/2022 CLINICAL DATA:  Abdominal pain EXAM: CT ABDOMEN AND PELVIS WITH CONTRAST TECHNIQUE: Multidetector CT imaging of the abdomen and pelvis was performed using the standard protocol following bolus administration of intravenous contrast. RADIATION DOSE REDUCTION: This exam was performed according to the departmental dose-optimization program which includes automated exposure control, adjustment of the mA and/or kV according to patient size and/or use of iterative reconstruction technique. CONTRAST:  167mL OMNIPAQUE IOHEXOL 300 MG/ML SOLN additional oral enteric contrast COMPARISON:  10/18/2015 FINDINGS: Lower chest: Small  bilateral pleural effusions and associated atelectasis or consolidation. Coronary artery calcifications. Hepatobiliary: No solid liver abnormality is seen. No gallstones, gallbladder wall thickening, or biliary dilatation. Pancreas: Unremarkable. No pancreatic ductal dilatation or surrounding inflammatory changes. Spleen: Normal in size without significant abnormality. Adrenals/Urinary Tract: Adrenal glands are unremarkable. Kidneys are normal, without renal calculi, solid lesion, or hydronephrosis. Bladder is unremarkable. Stomach/Bowel: Stomach is within normal limits. The proximal small bowel is mildly distended by fluid and oral enteric contrast, largest loops measuring up to 3.9 cm. Although a specific transition point is difficult to identify, the terminal loops  of ileum are sharply decompressed, and there is an abrupt caliber change of the distal ileum in the right hemiabdomen (in the vicinity of series 6, image 40, series 3, image 64). Moderate burden of stool throughout the colon. Gas and stool present to the rectum. Vascular/Lymphatic: Aortic atherosclerosis. No enlarged abdominal or pelvic lymph nodes. Reproductive: No mass or other significant abnormality. Other: Small, fat containing bilateral inguinal hernias. Small volume ascites. Musculoskeletal: No acute or significant osseous findings. IMPRESSION: 1. The proximal small bowel is mildly distended by fluid and oral enteric contrast, largest loops measuring up to 3.9 cm. Although a specific transition point is difficult to identify, the terminal loops of ileum are sharply decompressed, and there is an abrupt caliber change of the distal ileum in the right hemiabdomen. Findings are concerning for developing or partial small bowel obstruction. 2. Moderate burden of stool throughout the colon to the rectum. 3. Small volume ascites. 4. Small bilateral pleural effusions and associated atelectasis or consolidation. 5. Coronary artery disease. Aortic Atherosclerosis (ICD10-I70.0). Electronically Signed   By: Delanna Ahmadi M.D.   On: 04/14/2022 17:01   DG Abd Portable 1V  Result Date: 04/12/2022 CLINICAL DATA:  Abdomen distension EXAM: PORTABLE ABDOMEN - 1 VIEW COMPARISON:  CT 10/19/2015 FINDINGS: Diffuse increased small and large bowel gas. Mild stool in the colon. No radiopaque calculi IMPRESSION: Diffuse increased small and large bowel gas suggestive of an ileus. Electronically Signed   By: Donavan Foil M.D.   On: 04/12/2022 20:26   ECHOCARDIOGRAM COMPLETE  Result Date: 04/10/2022    ECHOCARDIOGRAM REPORT   Patient Name:   FADI MENTER [facility] Date of Exam: 04/10/2022 Medical Rec #:  [id number]      Height:       71.0 in Accession #:    [id number]     Weight:       220.9 lb Date of Birth:  01/23/1961      BSA:           2.200 m Patient Age:    43 years       BP:           118/83 mmHg Patient Gender: M              HR:           86 bpm. Exam Location:  Inpatient Procedure: 2D Echo, Cardiac Doppler and Color Doppler Indications:    Dyspnea  History:        Patient has prior history of Echocardiogram examinations, most                 recent 01/19/2018. PVD; Risk Factors:Diabetes and Current Smoker.                 Substance abuse (cocaine), ETOH.  Sonographer:    Eartha Inch Referring Phys: [id number] [place]  Sonographer Comments: Technically difficult study due to poor  echo windows. Image acquisition challenging due to respiratory motion. IMPRESSIONS  1. Left ventricular ejection fraction, by estimation, is 65 to 70%. The left ventricle has normal function. The left ventricle has no regional wall motion abnormalities. Left ventricular diastolic parameters are consistent with Grade I diastolic dysfunction (impaired relaxation).  2. Right ventricular systolic function is normal. The right ventricular size is normal.  3. The mitral valve is normal in structure. No evidence of mitral valve regurgitation. No evidence of mitral stenosis.  4. The aortic valve is tricuspid. Aortic valve regurgitation is not visualized. No aortic stenosis is present. Comparison(s): LV is slightly more vigorous than prior. FINDINGS  Left Ventricle: Left ventricular ejection fraction, by estimation, is 65 to 70%. The left ventricle has normal function. The left ventricle has no regional wall motion abnormalities. The left ventricular internal cavity size was normal in size. There is  no left ventricular hypertrophy. Left ventricular diastolic parameters are consistent with Grade I diastolic dysfunction (impaired relaxation). Right Ventricle: The right ventricular size is normal. No increase in right ventricular wall thickness. Right ventricular systolic function is normal. Left Atrium: Left atrial size was normal in size. Right Atrium: Right atrial  size was normal in size. Pericardium: Trivial pericardial effusion is present. Mitral Valve: The mitral valve is normal in structure. No evidence of mitral valve regurgitation. No evidence of mitral valve stenosis. Tricuspid Valve: The tricuspid valve is normal in structure. Tricuspid valve regurgitation is not demonstrated. No evidence of tricuspid stenosis. Aortic Valve: The aortic valve is tricuspid. Aortic valve regurgitation is not visualized. No aortic stenosis is present. Pulmonic Valve: The pulmonic valve was normal in structure. Pulmonic valve regurgitation is not visualized. No evidence of pulmonic stenosis. Aorta: The aortic root and ascending aorta are structurally normal, with no evidence of dilitation. IAS/Shunts: No atrial level shunt detected by color flow Doppler.  LEFT VENTRICLE PLAX 2D LVIDd:         3.90 cm   Diastology LVIDs:         2.50 cm   LV e' medial:    7.07 cm/s LV PW:         0.90 cm   LV E/e' medial:  6.9 LV IVS:        0.90 cm   LV e' lateral:   8.70 cm/s LVOT diam:     2.10 cm   LV E/e' lateral: 5.6 LV SV:         88 LV SV Index:   40 LVOT Area:     3.46 cm  RIGHT VENTRICLE RV S prime:     22.40 cm/s TAPSE (M-mode): 2.2 cm LEFT ATRIUM             Index        RIGHT ATRIUM           Index LA diam:        2.60 cm 1.18 cm/m   RA Area:     11.50 cm LA Vol (A2C):   27.4 ml 12.46 ml/m  RA Volume:   23.10 ml  10.50 ml/m LA Vol (A4C):   23.4 ml 10.64 ml/m LA Biplane Vol: 26.9 ml 12.23 ml/m  AORTIC VALVE LVOT Vmax:   144.00 cm/s LVOT Vmean:  102.000 cm/s LVOT VTI:    0.254 m  AORTA Ao Root diam: 2.80 cm Ao Asc diam:  3.00 cm MITRAL VALVE MV Area (PHT): 2.80 cm    SHUNTS MV Decel Time: 271 msec    Systemic  VTI:  0.25 m MV E velocity: 48.80 cm/s  Systemic Diam: 2.10 cm MV A velocity: 70.30 cm/s MV E/A ratio:  0.69 Rudean Haskell MD Electronically signed by Rudean Haskell MD Signature Date/Time: 04/10/2022/9:15:02 AM    Final    CT CHEST WO CONTRAST  Result Date:  04/09/2022 CLINICAL DATA:  Possible pneumonia on chest radiographs EXAM: CT CHEST WITHOUT CONTRAST TECHNIQUE: Multidetector CT imaging of the chest was performed following the standard protocol without IV contrast. RADIATION DOSE REDUCTION: This exam was performed according to the departmental dose-optimization program which includes automated exposure control, adjustment of the mA and/or kV according to patient size and/or use of iterative reconstruction technique. COMPARISON:  Chest radiograph dated 04/09/2022 FINDINGS: Cardiovascular: Heart is normal in size.  No pericardial effusion. No evidence of thoracic aortic aneurysm. Atherosclerotic calcifications of the aortic root/arch. Coronary atherosclerosis of the LAD. Mediastinum/Nodes: No suspicious mediastinal lymphadenopathy. Visualized thyroid is notable for a 12 mm left thyroid nodule (series 3/image 24). Not clinically significant; no follow-up imaging recommended (ref: J Am Coll Radiol. 2015 Feb;12(2): 143-50). Lungs/Pleura: Minimal dependent atelectasis in the posterior aspect of the upper lobes. Additional patchy bilateral lower lobe opacities, favoring atelectasis. Pneumonia is considered unlikely given the appearance/configuration. No suspicious pulmonary nodules. Mild centrilobular and paraseptal emphysematous changes, upper lung predominant. No pleural effusion or pneumothorax. Upper Abdomen: Visualized upper abdomen is notable for hepatic steatosis. Musculoskeletal: Mild degenerative changes of the visualized thoracolumbar spine. IMPRESSION: Patchy bilateral lower lobe opacities, favoring atelectasis. Pneumonia is considered unlikely. Aortic Atherosclerosis (ICD10-I70.0) and Emphysema (ICD10-J43.9). Electronically Signed   By: Julian Hy M.D.   On: 04/09/2022 19:11   EEG adult  Result Date: 04/09/2022 Lora Havens, MD     04/09/2022 11:20 AM Patient Name: CURT OATIS MRN: [id number] Epilepsy Attending: Lora Havens Referring  Physician/Provider: Lorenza Chick, MD Date: 04/09/2022 Duration: 21.59 mins Patient history: 61 year old male with altered mental status.  EEG to evaluate for seizure. Level of alertness: Awake, asleep AEDs during EEG study: None Technical aspects: This EEG study was done with scalp electrodes positioned according to the 10-20 International system of electrode placement. Electrical activity was reviewed with band pass filter of 1-$RemoveBef'70Hz'pOrrLlHImO$ , sensitivity of 7 uV/mm, display speed of 66mm/sec with a $Remo'60Hz'SyAhP$  notched filter applied as appropriate. EEG data were recorded continuously and digitally stored.  Video monitoring was available and reviewed as appropriate. Description: The posterior dominant rhythm consists of 7 Hz activity of moderate voltage (25-35 uV) seen predominantly in posterior head regions, symmetric and reactive to eye opening and eye closing. Sleep was characterized by vertex waves, sleep spindles (12 to 14 Hz), maximal frontocentral region.  Hyperventilation and photic stimulation were not performed.   EKG artifact was seen throughout the study. ABNORMALITY -Background slow IMPRESSION: This study is suggestive of mild diffuse encephalopathy, nonspecific to etiology.  No seizures or epileptiform discharges were seen throughout the recording. Lora Havens   DG CHEST PORT 1 VIEW  Result Date: 04/09/2022 CLINICAL DATA:  Provided history: Post stroke. EXAM: PORTABLE CHEST 1 VIEW COMPARISON:  Prior chest radiograph 04/09/2022 and earlier. FINDINGS: Shallow inspiration radiograph. The cardiomediastinal silhouette is unchanged. Aortic atherosclerosis. Ill-defined opacity within the medial right lung base and within the left lung base. No evidence of pleural effusion or pneumothorax. Degenerative changes of the spine. IMPRESSION: Shallow inspiration radiograph. Ill-defined opacities within the bilateral lung bases. Findings at least partially reflects atelectasis. However, superimposed aspiration/pneumonia  cannot be excluded. Aortic Atherosclerosis (ICD10-I70.0). Electronically Signed  By: Kellie Simmering D.O.   On: 04/09/2022 09:07   DG Chest Portable 1 View  Result Date: 04/09/2022 CLINICAL DATA:  Left facial droop and weakness, initial encounter EXAM: PORTABLE CHEST 1 VIEW COMPARISON:  01/17/2018 FINDINGS: The heart size and mediastinal contours are within normal limits. Both lungs are clear. The visualized skeletal structures are unremarkable. IMPRESSION: No active disease. Electronically Signed   By: Inez Catalina M.D.   On: 04/09/2022 02:15   MR BRAIN WO CONTRAST  Result Date: 04/09/2022 CLINICAL DATA:  Stroke follow-up EXAM: MRI HEAD WITHOUT CONTRAST TECHNIQUE: Multiplanar, multiecho pulse sequences of the brain and surrounding structures were obtained without intravenous contrast. Five series are provided. COMPARISON:  None Available. FINDINGS: Tailored stroke protocol was used, consisting of axial and coronal diffusion-weighted imaging and axial FLAIR. There is no acute infarct. No focal parenchymal lesion. CSF spaces are normal. IMPRESSION: No acute infarct. Electronically Signed   By: Ulyses Jarred M.D.   On: 04/09/2022 02:03   CT HEAD CODE STROKE WO CONTRAST  Result Date: 04/09/2022 CLINICAL DATA:  Code stroke.  Acute neurologic deficit EXAM: CT HEAD WITHOUT CONTRAST TECHNIQUE: Contiguous axial images were obtained from the base of the skull through the vertex without intravenous contrast. RADIATION DOSE REDUCTION: This exam was performed according to the departmental dose-optimization program which includes automated exposure control, adjustment of the mA and/or kV according to patient size and/or use of iterative reconstruction technique. COMPARISON:  01/17/2018 FINDINGS: Brain: There is no mass, hemorrhage or extra-axial collection. The size and configuration of the ventricles and extra-axial CSF spaces are normal. The brain parenchyma is normal, without evidence of acute or chronic infarction.  Vascular: No abnormal hyperdensity of the major intracranial arteries or dural venous sinuses. No intracranial atherosclerosis. Skull: The visualized skull base, calvarium and extracranial soft tissues are normal. Sinuses/Orbits: No fluid levels or advanced mucosal thickening of the visualized paranasal sinuses. No mastoid or middle ear effusion. The orbits are normal. ASPECTS [facility] Stroke Program Early CT Score) - Ganglionic level infarction (caudate, lentiform nuclei, internal capsule, insula, M1-M3 cortex): 7 - Supraganglionic infarction (M4-M6 cortex): 3 Total score (0-10 with 10 being normal): 10 IMPRESSION: 1. No acute intracranial abnormality. 2. ASPECTS is 10. These results were called by telephone at the time of interpretation on 04/09/2022 at 1:47 am to provider [facility] , who verbally acknowledged these results. Electronically Signed   By: Ulyses Jarred M.D.   On: 04/09/2022 01:47     Subjective: No acute issues or events overnight tolerating p.o. well denies nausea vomiting diarrhea constipation any fevers chills or chest pain   Discharge Exam: Vitals:   05/05/22 2120 05/06/22 0435  BP: 110/67 107/72  Pulse: 96 87  Resp: 18 19  Temp: 98.4 F (36.9 C) 98.6 F (37 C)  SpO2: 92%    Vitals:   05/05/22 1408 05/05/22 1955 05/05/22 2120 05/06/22 0435  BP: 107/64 (!) 107/96 110/67 107/72  Pulse: 83 99 96 87  Resp: $Remo'17 18 18 19  'zPxvk$ Temp: 97.8 F (36.6 C) 98.4 F (36.9 C) 98.4 F (36.9 C) 98.6 F (37 C)  TempSrc: Oral Oral Oral Oral  SpO2: 97% 95% 92%   Weight:      Height:        General: Pt is alert, awake, not in acute distress Cardiovascular: RRR, S1/S2 +, no rubs, no gallops Respiratory: CTA bilaterally, no wheezing, no rhonchi Abdominal: Soft, NT, ND, bowel sounds + Extremities: no edema, no cyanosis    The  results of significant diagnostics from this hospitalization (including imaging, microbiology, ancillary and laboratory) are listed below for reference.      Microbiology: No results found for this or any previous visit (from the past 240 hour(s)).   Labs: BNP (last 3 results) No results for input(s): "BNP" in the last 8760 hours. Basic Metabolic Panel: Recent Labs  Lab 04/30/22 0518 05/01/22 0549 05/03/22 0443 05/04/22 0746 05/05/22 0356  NA 134* 137 137 135 136  K 4.0 3.8 3.2* 3.7 3.7  CL 103 106 106 106 104  CO2 $Re'23 23 22 23 23  'sQw$ GLUCOSE 204* 292* 184* 183* 114*  BUN 30* 27* 28* 24* 17  CREATININE 0.87 0.83 0.79 0.74 0.69  CALCIUM 8.9 8.6* 9.1 8.8* 9.6  MG 2.1  --  2.2  --   --   PHOS 2.5  --  3.0  --   --    Liver Function Tests: Recent Labs  Lab 04/30/22 0518 05/03/22 0443  AST 65* 21  ALT 188* 86*  ALKPHOS 130* 108  BILITOT 0.3 0.3  PROT 6.9 6.8  ALBUMIN 2.9* 3.0*   No results for input(s): "LIPASE", "AMYLASE" in the last 168 hours. No results for input(s): "AMMONIA" in the last 168 hours. CBC: Recent Labs  Lab 04/30/22 0518 05/01/22 0549 05/03/22 0443  WBC 12.5* 12.0* 11.4*  HGB 9.6* 9.9* 10.3*  HCT 29.4* 30.9* 31.1*  MCV 105.0* 106.6* 105.8*  PLT 331 335 347   Cardiac Enzymes: No results for input(s): "CKTOTAL", "CKMB", "CKMBINDEX", "TROPONINI" in the last 168 hours. BNP: Invalid input(s): "POCBNP" CBG: Recent Labs  Lab 05/05/22 1802 05/05/22 2118 05/05/22 2354 05/06/22 0601 05/06/22 1212  GLUCAP 114* 128* 126* 131* 110*   D-Dimer No results for input(s): "DDIMER" in the last 72 hours. Hgb A1c No results for input(s): "HGBA1C" in the last 72 hours. Lipid Profile No results for input(s): "CHOL", "HDL", "LDLCALC", "TRIG", "CHOLHDL", "LDLDIRECT" in the last 72 hours. Thyroid function studies No results for input(s): "TSH", "T4TOTAL", "T3FREE", "THYROIDAB" in the last 72 hours.  Invalid input(s): "FREET3" Anemia work up No results for input(s): "VITAMINB12", "FOLATE", "FERRITIN", "TIBC", "IRON", "RETICCTPCT" in the last 72 hours. Urinalysis    Component Value Date/Time   COLORURINE  AMBER (A) 04/19/2022 1640   APPEARANCEUR CLEAR 04/19/2022 1640   LABSPEC 1.020 04/19/2022 1640   PHURINE 6.0 04/19/2022 1640   GLUCOSEU >=500 (A) 04/19/2022 1640   HGBUR NEGATIVE 04/19/2022 1640   BILIRUBINUR NEGATIVE 04/19/2022 1640   KETONESUR NEGATIVE 04/19/2022 1640   PROTEINUR NEGATIVE 04/19/2022 1640   UROBILINOGEN 0.2 12/08/2009 2250   NITRITE NEGATIVE 04/19/2022 1640   LEUKOCYTESUR NEGATIVE 04/19/2022 1640   Sepsis Labs Recent Labs  Lab 04/30/22 0518 05/01/22 0549 05/03/22 0443  WBC 12.5* 12.0* 11.4*   Microbiology No results found for this or any previous visit (from the past 240 hour(s)).   Time coordinating discharge: Over 30 minutes  SIGNED:   Little Ishikawa, DO [facility] 05/06/2022, 12:43 PM Pager   If 7PM-7AM, please contact night-coverage [email or web address]

## 2022-05-06 NOTE — Progress Notes (Signed)
19 Days Post-Op  Subjective: CC: Reports he has no abdominal pain, nausea or vomiting. Tolerating diet and finishing most of his trays. Drank 2 shakes yesterday. Reports had 2 formed bm's yesterday.   Objective: Vital signs in last 24 hours: Temp:  [97.8 F (36.6 C)-98.6 F (37 C)] 98.6 F (37 C) (09/24 0435) Pulse Rate:  [83-99] 87 (09/24 0435) Resp:  [17-19] 19 (09/24 0435) BP: (107-110)/(64-96) 107/72 (09/24 0435) SpO2:  [92 %-97 %] 92 % (09/23 2120) Last BM Date : 05/06/22  Intake/Output from previous day: 09/23 0701 - 09/24 0700 In: 960 [P.O.:960] Out: 650 [Urine:650] Intake/Output this shift: No intake/output data recorded.  PE: Gen:  Alert, NAD GI: Soft. Stable distension. Active bowel sounds. NT. Abd binder in place - removed for exam. Incision with steri strips. No discharge or erythema  Lab Results:  No results for input(s): "WBC", "HGB", "HCT", "PLT" in the last 72 hours. BMET Recent Labs    05/04/22 0746 05/05/22 0356  NA 135 136  K 3.7 3.7  CL 106 104  CO2 23 23  GLUCOSE 183* 114*  BUN 24* 17  CREATININE 0.74 0.69  CALCIUM 8.8* 9.6   PT/INR No results for input(s): "LABPROT", "INR" in the last 72 hours. CMP     Component Value Date/Time   NA 136 05/05/2022 0356   NA 142 11/15/2017 1646   K 3.7 05/05/2022 0356   CL 104 05/05/2022 0356   CO2 23 05/05/2022 0356   GLUCOSE 114 (H) 05/05/2022 0356   BUN 17 05/05/2022 0356   BUN 15 11/15/2017 1646   CREATININE 0.69 05/05/2022 0356   CREATININE 1.14 09/27/2016 1532   CALCIUM 9.6 05/05/2022 0356   PROT 6.8 05/03/2022 0443   PROT 7.3 10/07/2017 0954   ALBUMIN 3.0 (L) 05/03/2022 0443   ALBUMIN 4.4 10/07/2017 0954   AST 21 05/03/2022 0443   ALT 86 (H) 05/03/2022 0443   ALKPHOS 108 05/03/2022 0443   BILITOT 0.3 05/03/2022 0443   BILITOT 0.6 10/07/2017 0954   GFRNONAA >60 05/05/2022 0356   GFRNONAA 71 09/27/2016 1532   GFRAA >60 01/18/2018 0526   GFRAA 82 09/27/2016 1532   Lipase  No  results found for: "LIPASE"  Studies/Results: No results found.  Anti-infectives: Anti-infectives (From admission, onward)    Start     Dose/Rate Route Frequency Ordered Stop   04/17/22 1100  ceFAZolin (ANCEF) IVPB 2g/100 mL premix        2 g 200 mL/hr over 30 Minutes Intravenous On call to O.R. 04/17/22 1045 04/17/22 1150   04/09/22 1100  Ampicillin-Sulbactam (UNASYN) 3 g in sodium chloride 0.9 % 100 mL IVPB  Status:  Discontinued        3 g 200 mL/hr over 30 Minutes Intravenous Every 8 hours 04/09/22 1045 04/14/22 1419        Assessment/Plan POD 19 s/p Exploratory laparotomy, lysis of adhesions for SBO by Dr. Luisa Hart, 04/17/22 - Tolerating regular diet +shakes without n/v and having bowel function. Per prior notes, on reglan, consider weaning off given potential side effects this can cause. Off TPN - Staples out 9/20 - Continue abdominal binder - CT scan on 9/15 with no intra-abdominal process or infection. WBC down on last check. Afebrile. 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 Currently recommending SNF. Plan is to return to [facility] at d/c. Okay for d/c from our standpoint. Will arrange follow  up with Dr. Brantley Stage.   FEN - Reg, TPN off, IVF per TRH. VTE - SCDs; Lovenox  ID - None currently.   - Per TRH -  New dx DM (DKA on presentation) AKI - resolved PNA - completed abx     LOS: 27 days    Jillyn Ledger , [facility] Surgery 05/06/2022, 10:46 AM Please see Amion for pager number during day hours 7:00am-4:30pm

## 2022-05-06 NOTE — TOC Progression Note (Addendum)
Transition of Care [facility]) - Progression Note    Patient Details  Name: Cody [name] MRN: [id number] Date of Birth: 03-Oct-1960  [facility] CM/SW [place], LCSW Phone Number: 05/06/2022, 1:19 PM  Clinical Narrative:     Patient will DC to:?[place] Anticipated DC date:?05/06/2022 Family notified:?Mother -Research officer, political party by: [name]   Per MD patient ready for DC to [place], patient, patient's family, and facility notified of DC. Discharge Summary sent to facility. RN given number for report [phone] room 104b. DC packet on chart. Ambulance transport requested for patient for 3pm at the request of the facility.   CSW signing off.   [place], [place]   Expected Discharge Plan: Skilled Nursing Facility Barriers to Discharge: Barriers Resolved  Expected Discharge Plan and Services Expected Discharge Plan: [place] In-house Referral: Clinical Social Work     Living arrangements for the past 2 months: [place] Expected Discharge Date: 05/06/22                                     Social Determinants of Health (SDOH) Interventions    Readmission Risk Interventions     No data to display

## 2022-05-06 NOTE — Progress Notes (Signed)
Report called in and given to Gwenette Greet, RN at receiving facility, [facility].

## 2022-05-06 NOTE — Progress Notes (Signed)
Mobility Specialist - Progress Note   05/06/22 1212  Mobility  Activity Ambulated with assistance in hallway  Level of Assistance Contact guard assist, steadying assist  Assistive Device Front wheel walker  Distance Ambulated (ft) 200 ft  Activity Response Tolerated well  $Mobility charge 1 Mobility    Pt received in recliner agreeable to mobility. C/o right foot pain during ambulation. Left in recliner w/ call bell in reach and all needs met.   Paulla Dolly Mobility Specialist

## 2022-05-06 NOTE — Progress Notes (Signed)
PICC line removed per order.  Pressure held to achieve hemostasis.  Vaseline gauze, gauze, tegaderm applied. Aftercare education provided and pt verbalized understanding.

## 2022-08-09 ENCOUNTER — Emergency Department (HOSPITAL_COMMUNITY)
Admission: EM | Admit: 2022-08-09 | Discharge: 2022-08-09 | Discharge status: Against Medical Advice | Payer: Medicaid Other | Attending: Physician Assistant | Admitting: Physician Assistant

## 2022-08-09 ENCOUNTER — Other Ambulatory Visit: Payer: Self-pay

## 2022-08-09 DIAGNOSIS — R1110 Vomiting, unspecified: Secondary | ICD-10-CM | POA: Diagnosis not present

## 2022-08-09 DIAGNOSIS — E119 Type 2 diabetes mellitus without complications: Secondary | ICD-10-CM | POA: Diagnosis not present

## 2022-08-09 DIAGNOSIS — R109 Unspecified abdominal pain: Secondary | ICD-10-CM | POA: Insufficient documentation

## 2022-08-09 DIAGNOSIS — Z5321 Procedure and treatment not carried out due to patient leaving prior to being seen by health care provider: Secondary | ICD-10-CM | POA: Insufficient documentation

## 2022-08-09 LAB — URINALYSIS, ROUTINE W REFLEX MICROSCOPIC
Bilirubin Urine: NEGATIVE
Glucose, UA: >=500 mg/dL — AB
Hgb urine dipstick: NEGATIVE
Ketones, ur: NEGATIVE mg/dL
Leukocytes,Ua: NEGATIVE
Nitrite: NEGATIVE
Protein, ur: 100 mg/dL — AB
Specific Gravity, Urine: 1.015 (ref 1.005–1.030)
pH: 8 (ref 5.0–8.0)

## 2022-08-09 LAB — CBC WITH DIFFERENTIAL/PLATELET
Abs Immature Granulocytes: 0.04 10*3/uL (ref 0.00–0.07)
Basophils Absolute: 0 10*3/uL (ref 0.0–0.1)
Basophils Relative: 0 %
Eosinophils Absolute: 0 10*3/uL (ref 0.0–0.5)
Eosinophils Relative: 0 %
HCT: 41.6 % (ref 39.0–52.0)
Hemoglobin: 14.2 g/dL (ref 13.0–17.0)
Immature Granulocytes: 0 %
Lymphocytes Relative: 12 %
Lymphs Abs: 1.2 10*3/uL (ref 0.7–4.0)
MCH: 32.2 pg (ref 26.0–34.0)
MCHC: 34.1 g/dL (ref 30.0–36.0)
MCV: 94.3 fL (ref 80.0–100.0)
Monocytes Absolute: 0.4 10*3/uL (ref 0.1–1.0)
Monocytes Relative: 4 %
Neutro Abs: 8.3 10*3/uL — ABNORMAL HIGH (ref 1.7–7.7)
Neutrophils Relative %: 84 %
Platelets: 251 10*3/uL (ref 150–400)
RBC: 4.41 MIL/uL (ref 4.22–5.81)
RDW: 13.1 % (ref 11.5–15.5)
WBC: 9.9 10*3/uL (ref 4.0–10.5)
nRBC: 0 % (ref 0.0–0.2)

## 2022-08-09 LAB — COMPREHENSIVE METABOLIC PANEL
ALT: 290 U/L — ABNORMAL HIGH (ref 0–44)
AST: 475 U/L — ABNORMAL HIGH (ref 15–41)
Albumin: 4.2 g/dL (ref 3.5–5.0)
Alkaline Phosphatase: 100 U/L (ref 38–126)
Anion gap: 10 (ref 5–15)
BUN: 12 mg/dL (ref 8–23)
CO2: 23 mmol/L (ref 22–32)
Calcium: 9.5 mg/dL (ref 8.9–10.3)
Chloride: 105 mmol/L (ref 98–111)
Creatinine, Ser: 1.09 mg/dL (ref 0.61–1.24)
GFR, Estimated: >60 mL/min (ref >60)
Glucose, Bld: 164 mg/dL — ABNORMAL HIGH (ref 70–99)
Potassium: 3.7 mmol/L (ref 3.5–5.1)
Sodium: 138 mmol/L (ref 135–145)
Total Bilirubin: 2.3 mg/dL — ABNORMAL HIGH (ref 0.3–1.2)
Total Protein: 8.2 g/dL — ABNORMAL HIGH (ref 6.5–8.1)

## 2022-08-09 LAB — LIPASE, BLOOD: Lipase: 31 U/L (ref 11–51)

## 2022-08-09 NOTE — ED Triage Notes (Signed)
Patient reports central abdominal pain with emesis and diarrhea onset last night.

## 2022-08-09 NOTE — ED Notes (Signed)
Pt called x 3 no answer 

## 2022-08-09 NOTE — ED Provider Triage Note (Signed)
Emergency Medicine Provider Triage Evaluation Note  Cody [name] , a 61 y.o. male  was evaluated in triage.  Pt complains of abdominal pain.  Began after eating Cody [name] yesterday evening.  1 episode of NBNB emesis.  2 episodes of loose stool without melena or bright blood per rectum.  Abdominal pain is diffuse and central.  Does not radiate.  History of diabetes however does not typically check his blood sugars.  He is on long-acting insulin.  Review of Systems  Positive: Abd pain, emesis Negative: Fever, back pain, bloody stool  Physical Exam  BP (!) 169/85 (BP Location: Left Arm)   Pulse (!) 109   Temp 98.2 F (36.8 C) (Oral)   Resp (!) 21   SpO2 92%  Gen:   Awake, no distress   Resp:  Normal effort  MSK:   Moves extremities without difficulty  ABD:  Soft mild diffuse tenderness Other:    Medical Decision Making  Medically screening exam initiated at 6:03 AM.  Appropriate orders placed.  Cody [name] was informed that the remainder of the evaluation will be completed by another provider, this initial triage assessment does not replace that evaluation, and the importance of remaining in the ED until their evaluation is complete.  Abd pain, emesis   Cody [name] A, PA-C 08/09/22 6060

## 2023-04-19 ENCOUNTER — Other Ambulatory Visit (INDEPENDENT_AMBULATORY_CARE_PROVIDER_SITE_OTHER): Payer: Self-pay

## 2023-04-19 ENCOUNTER — Ambulatory Visit (INDEPENDENT_AMBULATORY_CARE_PROVIDER_SITE_OTHER): Payer: Medicaid Other | Admitting: Orthopaedic Surgery

## 2023-04-19 DIAGNOSIS — M25572 Pain in left ankle and joints of left foot: Secondary | ICD-10-CM
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
## 2024-02-15 ENCOUNTER — Encounter (HOSPITAL_COMMUNITY): Payer: Self-pay

## 2024-02-15 ENCOUNTER — Other Ambulatory Visit: Payer: Self-pay

## 2024-02-15 ENCOUNTER — Emergency Department (HOSPITAL_COMMUNITY)

## 2024-02-15 ENCOUNTER — Inpatient Hospital Stay (HOSPITAL_COMMUNITY)
Admission: EM | Admit: 2024-02-15 | Discharge: 2024-02-18 | DRG: 728 | Disposition: A | Discharge status: Home Health | Attending: Family Medicine | Admitting: Family Medicine

## 2024-02-15 DIAGNOSIS — E1065 Type 1 diabetes mellitus with hyperglycemia: Secondary | ICD-10-CM | POA: Diagnosis present

## 2024-02-15 DIAGNOSIS — N489 Disorder of penis, unspecified: Secondary | ICD-10-CM | POA: Diagnosis not present

## 2024-02-15 DIAGNOSIS — F1410 Cocaine abuse, uncomplicated: Secondary | ICD-10-CM | POA: Diagnosis present

## 2024-02-15 DIAGNOSIS — N472 Paraphimosis: Secondary | ICD-10-CM | POA: Diagnosis present

## 2024-02-15 DIAGNOSIS — N390 Urinary tract infection, site not specified: Secondary | ICD-10-CM | POA: Diagnosis present

## 2024-02-15 DIAGNOSIS — F1910 Other psychoactive substance abuse, uncomplicated: Secondary | ICD-10-CM | POA: Insufficient documentation

## 2024-02-15 DIAGNOSIS — Z801 Family history of malignant neoplasm of trachea, bronchus and lung: Secondary | ICD-10-CM | POA: Diagnosis not present

## 2024-02-15 DIAGNOSIS — E876 Hypokalemia: Secondary | ICD-10-CM | POA: Diagnosis present

## 2024-02-15 DIAGNOSIS — N179 Acute kidney failure, unspecified: Secondary | ICD-10-CM | POA: Diagnosis present

## 2024-02-15 DIAGNOSIS — N4889 Other specified disorders of penis: Secondary | ICD-10-CM

## 2024-02-15 DIAGNOSIS — R21 Rash and other nonspecific skin eruption: Secondary | ICD-10-CM | POA: Diagnosis present

## 2024-02-15 DIAGNOSIS — N476 Balanoposthitis: Principal | ICD-10-CM | POA: Diagnosis present

## 2024-02-15 DIAGNOSIS — A419 Sepsis, unspecified organism: Principal | ICD-10-CM | POA: Diagnosis present

## 2024-02-15 DIAGNOSIS — E119 Type 2 diabetes mellitus without complications: Secondary | ICD-10-CM

## 2024-02-15 DIAGNOSIS — E872 Acidosis, unspecified: Secondary | ICD-10-CM | POA: Diagnosis present

## 2024-02-15 DIAGNOSIS — N481 Balanitis: Secondary | ICD-10-CM

## 2024-02-15 DIAGNOSIS — R0902 Hypoxemia: Secondary | ICD-10-CM | POA: Diagnosis not present

## 2024-02-15 DIAGNOSIS — Z87891 Personal history of nicotine dependence: Secondary | ICD-10-CM | POA: Diagnosis not present

## 2024-02-15 DIAGNOSIS — Z833 Family history of diabetes mellitus: Secondary | ICD-10-CM

## 2024-02-15 DIAGNOSIS — N485 Ulcer of penis: Secondary | ICD-10-CM | POA: Diagnosis present

## 2024-02-15 DIAGNOSIS — E860 Dehydration: Secondary | ICD-10-CM | POA: Diagnosis present

## 2024-02-15 DIAGNOSIS — K219 Gastro-esophageal reflux disease without esophagitis: Secondary | ICD-10-CM | POA: Diagnosis present

## 2024-02-15 DIAGNOSIS — R030 Elevated blood-pressure reading, without diagnosis of hypertension: Secondary | ICD-10-CM | POA: Diagnosis present

## 2024-02-15 LAB — COMPREHENSIVE METABOLIC PANEL WITH GFR
ALT: 30 U/L (ref 0–44)
AST: 30 U/L (ref 15–41)
Albumin: 4 g/dL (ref 3.5–5.0)
Alkaline Phosphatase: 82 U/L (ref 38–126)
Anion gap: 15 (ref 5–15)
BUN: 33 mg/dL — ABNORMAL HIGH (ref 8–23)
CO2: 19 mmol/L — ABNORMAL LOW (ref 22–32)
Calcium: 9.8 mg/dL (ref 8.9–10.3)
Chloride: 98 mmol/L (ref 98–111)
Creatinine, Ser: 1.73 mg/dL — ABNORMAL HIGH (ref 0.61–1.24)
GFR, Estimated: 44 mL/min — ABNORMAL LOW (ref >60)
Glucose, Bld: 258 mg/dL — ABNORMAL HIGH (ref 70–99)
Potassium: 3.3 mmol/L — ABNORMAL LOW (ref 3.5–5.1)
Sodium: 132 mmol/L — ABNORMAL LOW (ref 135–145)
Total Bilirubin: 0.9 mg/dL (ref 0.0–1.2)
Total Protein: 9 g/dL — ABNORMAL HIGH (ref 6.5–8.1)

## 2024-02-15 LAB — URINALYSIS, W/ REFLEX TO CULTURE (INFECTION SUSPECTED)
Bacteria, UA: NONE SEEN
Bilirubin Urine: NEGATIVE
Glucose, UA: >=500 mg/dL — AB
Ketones, ur: NEGATIVE mg/dL
Nitrite: NEGATIVE
Protein, ur: 100 mg/dL — AB
Specific Gravity, Urine: 1.02 (ref 1.005–1.030)
pH: 6 (ref 5.0–8.0)

## 2024-02-15 LAB — I-STAT VENOUS BLOOD GAS, ED
Acid-base deficit: 2 mmol/L (ref 0.0–2.0)
Bicarbonate: 22.4 mmol/L (ref 20.0–28.0)
Calcium, Ion: 1.14 mmol/L — ABNORMAL LOW (ref 1.15–1.40)
HCT: 46 % (ref 39.0–52.0)
Hemoglobin: 15.6 g/dL (ref 13.0–17.0)
O2 Saturation: 93 %
Potassium: 3.5 mmol/L (ref 3.5–5.1)
Sodium: 134 mmol/L — ABNORMAL LOW (ref 135–145)
TCO2: 24 mmol/L (ref 22–32)
pCO2, Ven: 37.7 mmHg — ABNORMAL LOW (ref 44–60)
pH, Ven: 7.382 (ref 7.25–7.43)
pO2, Ven: 67 mmHg — ABNORMAL HIGH (ref 32–45)

## 2024-02-15 LAB — CBC WITH DIFFERENTIAL/PLATELET
Abs Immature Granulocytes: 0.02 K/uL (ref 0.00–0.07)
Basophils Absolute: 0.1 K/uL (ref 0.0–0.1)
Basophils Relative: 1 %
Eosinophils Absolute: 0.1 K/uL (ref 0.0–0.5)
Eosinophils Relative: 1 %
HCT: 45 % (ref 39.0–52.0)
Hemoglobin: 15.3 g/dL (ref 13.0–17.0)
Immature Granulocytes: 0 %
Lymphocytes Relative: 41 %
Lymphs Abs: 3.3 K/uL (ref 0.7–4.0)
MCH: 32.4 pg (ref 26.0–34.0)
MCHC: 34 g/dL (ref 30.0–36.0)
MCV: 95.3 fL (ref 80.0–100.0)
Monocytes Absolute: 0.6 K/uL (ref 0.1–1.0)
Monocytes Relative: 8 %
Neutro Abs: 4 K/uL (ref 1.7–7.7)
Neutrophils Relative %: 49 %
Platelets: 334 K/uL (ref 150–400)
RBC: 4.72 MIL/uL (ref 4.22–5.81)
RDW: 13.2 % (ref 11.5–15.5)
WBC: 8.1 K/uL (ref 4.0–10.5)
nRBC: 0 % (ref 0.0–0.2)

## 2024-02-15 LAB — RESP PANEL BY RT-PCR (RSV, FLU A&B, COVID)  RVPGX2
Influenza A by PCR: NEGATIVE
Influenza B by PCR: NEGATIVE
Resp Syncytial Virus by PCR: NEGATIVE
SARS Coronavirus 2 by RT PCR: NEGATIVE

## 2024-02-15 LAB — GLUCOSE, CAPILLARY
Glucose-Capillary: 174 mg/dL — ABNORMAL HIGH (ref 70–99)
Glucose-Capillary: 144 mg/dL — ABNORMAL HIGH (ref 70–99)

## 2024-02-15 LAB — CBG MONITORING, ED: Glucose-Capillary: 118 mg/dL — ABNORMAL HIGH (ref 70–99)

## 2024-02-15 LAB — PROTIME-INR
INR: 1.2 (ref 0.8–1.2)
Prothrombin Time: 15.5 s — ABNORMAL HIGH (ref 11.4–15.2)

## 2024-02-15 LAB — I-STAT CG4 LACTIC ACID, ED
Lactic Acid, Venous: 3.7 mmol/L (ref 0.5–1.9)
Lactic Acid, Venous: 2.9 mmol/L (ref 0.5–1.9)

## 2024-02-15 LAB — HEMOGLOBIN A1C
Hgb A1c MFr Bld: 5.6 % (ref 4.8–5.6)
Mean Plasma Glucose: 114.02 mg/dL

## 2024-02-15 LAB — HIV ANTIBODY (ROUTINE TESTING W REFLEX): HIV Screen 4th Generation wRfx: NONREACTIVE

## 2024-02-15 MED ORDER — SODIUM CHLORIDE 0.9 % IV SOLN
1.0000 g | Freq: Every day | INTRAVENOUS | Status: DC
  Administered 2024-02-16 – 2024-02-18 (×3): 1 g via INTRAVENOUS
  Filled 2024-02-15 (×3): qty 10

## 2024-02-15 MED ORDER — INSULIN GLARGINE-YFGN 100 UNIT/ML ~~LOC~~ SOLN
5.0000 [IU] | Freq: Every day | SUBCUTANEOUS | Status: DC
  Administered 2024-02-16 – 2024-02-18 (×3): 5 [IU] via SUBCUTANEOUS
  Filled 2024-02-15 (×4): qty 0.05

## 2024-02-15 MED ORDER — INSULIN ASPART 100 UNIT/ML IJ SOLN
0.0000 [IU] | Freq: Three times a day (TID) | INTRAMUSCULAR | Status: DC
  Administered 2024-02-16: 1 [IU] via SUBCUTANEOUS
  Administered 2024-02-16: 2 [IU] via SUBCUTANEOUS
  Administered 2024-02-17 (×2): 1 [IU] via SUBCUTANEOUS

## 2024-02-15 MED ORDER — VANCOMYCIN HCL IN DEXTROSE 1-5 GM/200ML-% IV SOLN
1000.0000 mg | Freq: Once | INTRAVENOUS | Status: AC
  Administered 2024-02-15: 1000 mg via INTRAVENOUS
  Filled 2024-02-15: qty 200

## 2024-02-15 MED ORDER — POLYETHYLENE GLYCOL 3350 17 G PO PACK
17.0000 g | PACK | Freq: Every day | ORAL | Status: DC
  Administered 2024-02-15 – 2024-02-18 (×4): 17 g via ORAL
  Filled 2024-02-15 (×4): qty 1

## 2024-02-15 MED ORDER — CLOTRIMAZOLE 1 % EX CREA
TOPICAL_CREAM | Freq: Two times a day (BID) | CUTANEOUS | Status: DC
  Administered 2024-02-15: 1 via TOPICAL
  Filled 2024-02-15 (×2): qty 15

## 2024-02-15 MED ORDER — LACTATED RINGERS IV SOLN: INTRAVENOUS | Status: AC

## 2024-02-15 MED ORDER — VANCOMYCIN HCL 1250 MG/250ML IV SOLN
1250.0000 mg | INTRAVENOUS | Status: DC
  Administered 2024-02-16: 1250 mg via INTRAVENOUS
  Filled 2024-02-15 (×2): qty 250

## 2024-02-15 MED ORDER — ENOXAPARIN SODIUM 60 MG/0.6ML IJ SOSY
0.5000 mg/kg | PREFILLED_SYRINGE | INTRAMUSCULAR | Status: DC
  Administered 2024-02-15 – 2024-02-17 (×3): 60 mg via SUBCUTANEOUS
  Filled 2024-02-15 (×3): qty 0.6

## 2024-02-15 MED ORDER — ACETAMINOPHEN 325 MG PO TABS
650.0000 mg | ORAL_TABLET | Freq: Four times a day (QID) | ORAL | Status: DC | PRN
  Administered 2024-02-15: 650 mg via ORAL
  Filled 2024-02-15: qty 2

## 2024-02-15 MED ORDER — LACTATED RINGERS IV BOLUS (SEPSIS)
1000.0000 mL | Freq: Once | INTRAVENOUS | Status: AC
  Administered 2024-02-15: 1000 mL via INTRAVENOUS

## 2024-02-15 MED ORDER — NICOTINE 14 MG/24HR TD PT24
14.0000 mg | MEDICATED_PATCH | Freq: Every day | TRANSDERMAL | Status: DC
  Administered 2024-02-15 – 2024-02-18 (×4): 14 mg via TRANSDERMAL
  Filled 2024-02-15 (×4): qty 1

## 2024-02-15 MED ORDER — SODIUM CHLORIDE 0.9 % IV SOLN
2.0000 g | Freq: Once | INTRAVENOUS | Status: AC
  Administered 2024-02-15: 2 g via INTRAVENOUS
  Filled 2024-02-15: qty 20

## 2024-02-15 NOTE — Assessment & Plan Note (Signed)
 Notable history for cocaine and tobacco abuse. - TOC consult - 14 mg nicotine  patch

## 2024-02-15 NOTE — Progress Notes (Signed)
 Pt arrived from ED, patient is alert, denies any pain, V/S obtained, CCMD notified notified, CHG bath given , all needs met, call bell in reach.    02/15/24 1840  Vitals  Temp 98.1 F (36.7 C)  Temp Source Oral  BP (!) 150/81  MAP (mmHg) 99  BP Location Right Arm  BP Method Automatic  Patient Position (if appropriate) Lying  Pulse Rate 92  Pulse Rate Source Monitor  ECG Heart Rate 91  Resp 18  Level of Consciousness  Level of Consciousness Alert  Oxygen Therapy  SpO2 95 %  O2 Device Room Air  Pain Assessment  Pain Scale 0-10  Pain Score 0

## 2024-02-15 NOTE — Assessment & Plan Note (Signed)
 Vital signs improved with fluid resuscitation and initiation of antibiotics.  Initially tachycardic, hypertensive, elevated lactic acid.  UA consistent with likely urinary source.  Workup thus far for other sources negative. - Admit to family medicine teaching service, attending Dr. Anders, progressive - Empiric treatment with vancomycin  and ceftriaxone  - 150 mL/h LR - Vitals per floor - Continuous cardiac monitoring - Continuous pulse ox - PT/OT eval - Urine culture pending - AM CBC, BMP - Lactic acid 3.7 --> 2.9

## 2024-02-15 NOTE — ED Notes (Signed)
 Patient left the floor in stable condition, with his belongings and staff.

## 2024-02-15 NOTE — Progress Notes (Signed)
 Pharmacy Antibiotic Note  Cody [name] is a 63 y.o. male for which pharmacy has been consulted for vancomycin  dosing for sepsis. Patient presenting with burning genital rash for 3 days.  SCr 1.73 - presumed AKI WBC 8.1; LA 2.9; T 99.1; HR 103; RR 14 COVID neg / flu neg  G/C - collected HIV Screen - non-reactive  Plan: Ceftriaxone  per MD Vancomycin  1000 mg given  --Will repeat 1g now to complete a 2g load --Will plan for 1250 mg q24hr (eAUC 527.4) unless change in renal function Monitor WBC, fever, renal function, cultures De-escalate when able Levels at steady state  Height: 5' 9 (175.3 cm) Weight: 117.9 kg (260 lb) IBW/kg (Calculated) : 70.7  Temp (24hrs), Avg:99.1 F (37.3 C), Min:99.1 F (37.3 C), Max:99.1 F (37.3 C)  Recent Labs  Lab 02/15/24 1001 02/15/24 1027 02/15/24 1200  WBC 8.1  --   --   CREATININE 1.73*  --   --   LATICACIDVEN  --  3.7* 2.9*    Estimated Creatinine Clearance: 54.7 mL/min (A) (by C-G formula based on SCr of 1.73 mg/dL (H)).    No Known Allergies  Microbiology results: Pending  Thank you for allowing pharmacy to be a part of this patient's care.  Dorn Buttner, PharmD, BCPS 02/15/2024 1:59 PM ED Clinical Pharmacist -  [phone]

## 2024-02-15 NOTE — Assessment & Plan Note (Addendum)
 Paraphimosis that was reduced in the ED. Recent oral intercourse with a new partner.  ED provider, Lonni Camp, contacted urologist, Dr. Francisca, who recommended Lotrimin  cream as treatment but no other emergent urology treatment necessary at this time. - HSV, Gonorrhea/Chlamydia, RPR pending  - HIV non-reactive - Lotrimin  1% twice daily - Tylenol  every 6 as needed for pain

## 2024-02-15 NOTE — Assessment & Plan Note (Addendum)
 Vital signs improved with fluid resuscitation and initiation of antibiotics.  Initially tachycardic, hypertensive, elevated lactic acid.  UA consistent with likely urinary source.  Workup thus far for other sources negative. - Admit to family medicine teaching service, attending Dr. Anders, progressive - Empiric treatment with vancomycin  and ceftriaxone  - 150 mL/h LR - Vitals per floor - Continuous cardiac monitoring - Continuous pulse ox - PT/OT eval - Urine culture pending - AM CBC, BMP - Lactic acid 3.7 --> 2.9

## 2024-02-15 NOTE — Plan of Care (Signed)
 FMTS Brief Progress Note  S: asleep in bed, comfortably. Is experiencing some penile pain but otherwise no complaints.    O: BP (!) 151/78 (BP Location: Left Arm)   Pulse 83   Temp 98.3 F (36.8 C) (Oral)   Resp 16   Ht 5' 9 (1.753 m)   Wt 117.9 kg   SpO2 92%   BMI 38.40 kg/m   General: A&O, NAD HEENT: No sign of trauma, EOM grossly intact Cardiac: RRR, no m/r/g Respiratory: CTAB, normal WOB, no w/c/r GI: obese, NTTP, soft, normoactive bowel sounds  Extremities: NTTP, no peripheral edema. Neuro: no focal neurological deficits  Psych: Appropriate mood and affect   A/P: 63 yo M with PMHx of DMII, polysubstance use who is admitted for c/o sepsis 2/2 to UTI along with balanoposthitis. Patient did not meet SIRS criteria, lactic acid likely elevated ISO acute infection. VS stable. Will continue empiric antibiotics and consider de-escalation tomorrow.  - continue ceftriaxone  and vancomycin , pending Bcx and Ucx. - continue mIVF given AKI - awaiting STI labs - Orders reviewed. Labs for AM ordered, which was adjusted as needed.  - If condition changes, plan includes reevaluation and adjustments as needed  - rest of plan per day team    Lonnie Earnest, MD 02/15/2024, 8:23 PM PGY-2, [facility] Night Resident  Please page [phone] with questions.

## 2024-02-15 NOTE — Discharge Instructions (Signed)
 Outpatient Substance Abuse  Treatment- uninsured  Narcotics Anonymous 24-HOUR HELPLINE Pre-recorded for Meeting Schedules [facility] [phone]  [email or web address] ALCOHOLICS ANONYMOUS  [place]   Answering Service [phone] Please Note: All [place] Meetings are Non-smoking [email or web address].es  [facility] Drug Services -  Insurance: Medicaid /State funding/private insurance Methadone, suboxone/Intensive outpatient  [place]   [phone] Fax: [phone] [address] [place], [place], [address] [place] [phone] Fax: [phone]    [address] [place], [place], [address] ([address] [place], [place], [place], [place], [place], [place], [place], [place]) [facility] [email or web address] Accepts State funding/Medicaid Transitional housing, Intensive Outpatient Treatment, Outpatient treatment, Veterans Services  Phone: [phone] Fax: [phone] Address: [address] [place] [address]   [employer] [facility] ([email or web address].[email or web address]/) Insurance: Medicaid Case Management, Administrator, arts, Medication Management, Outpatient Therapy, Psychosocial Rehabilitation, Substance Abuse Intensive Outpatient  Phone: [phone] Fax: [phone] [address], [place], [place], [address]  [facility] Medicaid, most private insurance providers Types of Program: Individual/Group Therapy, Substance Abuse Treatment  Phone: [place] [phone] Fax: [phone] [address], [place], [place], [address] [place] [phone] [address], [address] [place], [place], [address]  [facility]  Medicaid Types of Program: SAIOP  Phone: [phone] Fax: [phone] [address] [place], [place], [place], [address] [facility] Medicaid/state funds Crisis line [phone] [place] [phone] [place] [phone]  [facility] [place] [phone]  [facility] [address];  [place], [place] [address] [phone]  Substance Abuse Intensive Outpatient Program [facility] [address] [place], [place] [address] [phone]- Substance abuse treatment  Successful Transitions  Insurance: [facility], [address], sliding scale Types of Program: substance abuse treatment, transportation assistance Phone: [phone] Fax: [phone] Address: [address], [place] [address] [facility] ([email or web address]) Insurance: UHC, [place], [place], [place] Program: addiction counseling, detoxification,  Phone: [phone]  Fax: [phone] Address: [address] [place], [place] [address]  [facility] (statewide facilities/programs) [address] (Medicaid/state funds) [place], [place] [address]                      http://[email or web address]/ [phone] Daniel mcalpine- [phone] [place]- [phone] [facility] [place] (2 Locations) (Medicaid/state funds) --[address]  walk in 8:30-12 and 1-2:30 [place], [id number]   [facility]- [phone] --[address] [place], [place] [address]  [phone] walk in 8:30-12 and 2-3:30  [facility] state funds/medicaid [address] [place], [place] [address] [phone] [facility] (Suboxone clinic) Medicaid/state funds  [address]  [place], [place] [address] [phone]   [facility]  [address], [place], [place] [address]  [phone] (24 hours) [place]- [address] [place], [place] [address]  [phone] (24 hours) [place]- [address] [place] [phone] [place]- [address] [place] [phone] [place] [address] [place] [phone] [facility]- Medicaid and state funds  [place]- [address] [place], [place] [address] [phone] (24 hours) [place]- [address] [place],  [place] [address] [phone] [facility]- [address] [place], [place] [address] [phone] (24 hours) [place] [address] [place], [place]  [address] [phone] [place]- [address] [place] [phone]

## 2024-02-15 NOTE — Assessment & Plan Note (Addendum)
 Unclear what dose of insulin  patient is taking as outpatient.  A1C 5.6. - 5 units [place]  inpatient  - Likely can discharge only on oral medications - Sensitive SSI - CBG checks with meals, and at night - Will discuss with patient initiating statin - Lipid panel

## 2024-02-15 NOTE — Assessment & Plan Note (Signed)
 Likely prerenal in the setting of sepsis and dehydration. - AM BMP - Fluids as above

## 2024-02-15 NOTE — ED Provider Notes (Cosign Needed Addendum)
 [facility]ERGENCY DEPARTMENT AT [facility] Provider Note   CSN: [id number] Arrival date & time: 02/15/24  9147     Patient presents with: Rash   Cody [name] is a 63 y.o. male.   The history is provided by the patient and medical records. No language interpreter was used.  Rash    63 year old male with history of alcohol use, tobacco use, diabetes, cocaine use, presenting with complaint of a rash.  Patient reports several days ago he received oral sex from a new sexual partner.  Since then he noticed pain and swelling to his penis thus prompting this ER visit.  He notes mild itchiness.  No fever or chills no chest pain shortness of breath no abdominal pain.  Did not use protection.  Does have known history of diabetes.  Prior to Admission medications   Medication Sig Start Date End Date Taking? Authorizing Provider  acetaminophen  (TYLENOL ) 325 MG tablet Take 2 tablets (650 mg total) by mouth every 6 (six) hours as needed for mild pain or fever. 05/06/22   Lue Elsie BROCKS, MD  ergocalciferol (VITAMIN D2) 1.25 MG (50000 UT) capsule Take 50,000 Units by mouth once a week.    [provider]  feeding supplement (ENSURE ENLIVE / ENSURE PLUS) LIQD Take 237 mLs by mouth 2 (two) times daily between meals. 05/06/22   Lue Elsie BROCKS, MD  insulin  glargine (LANTUS ) 100 UNIT/ML Solostar Pen Inject 10 Units into the skin daily. 05/06/22   Lue Elsie BROCKS, MD  Misc. Devices (CANE) MISC 1 each by Does not apply route daily. 02/12/17   [name], Josalyn, MD  Multiple Vitamins-Minerals (MULTIVITAMIN ADULT) TABS Take 1 tablet by mouth daily. 10/07/17   Vicci Barnie NOVAK, MD  niacin (VITAMIN B3) 500 MG tablet Take 500 mg by mouth 2 (two) times daily with a meal.    [provider]  polyethylene glycol (MIRALAX  / GLYCOLAX ) 17 g packet Take 17 g by mouth daily. 05/07/22   Lue Elsie BROCKS, MD  thiamine  100 MG tablet Take 1 tablet (100 mg total) by mouth daily.  09/27/16   [name], Josalyn, MD    Allergies: Patient has no known allergies.    Review of Systems  Skin:  Positive for rash.  All other systems reviewed and are negative.   Updated Vital Signs BP (!) 125/113 (BP Location: Right Arm)   Pulse (!) 119   Temp 99.1 F (37.3 C)   Resp 17   Ht 5' 9 (1.753 m)   SpO2 95%   BMI 32.20 kg/m   Physical Exam Constitutional:      General: He is not in acute distress.    Appearance: He is well-developed. He is ill-appearing.     Comments: Ill-appearing male laying in bed appears to be in no acute discomfort.  HENT:     Head: Atraumatic.  Eyes:     Conjunctiva/sclera: Conjunctivae normal.  Cardiovascular:     Rate and Rhythm: Tachycardia present.     Pulses: Normal pulses.     Heart sounds: Normal heart sounds.  Pulmonary:     Effort: Pulmonary effort is normal.     Breath sounds: Normal breath sounds.  Abdominal:     Palpations: Abdomen is soft.     Tenderness: There is no abdominal tenderness.  Genitourinary:    Comments: Chaperone present during exam.  Penile gland is discolored with macerated skin changes as well as some dried blood noted on the gland.  The foreskin  is retracted and it is markedly edematous.  Several smaller ulcerated lesions were noted to the penile shaft.  Scrotum with macerated skin changes but not nontender to palpation and perineal area soft. Musculoskeletal:     Cervical back: Normal range of motion and neck supple.  Skin:    Findings: No rash.  Neurological:     Mental Status: He is alert.          (all labs ordered are listed, but only abnormal results are displayed) Labs Reviewed  COMPREHENSIVE METABOLIC PANEL WITH GFR - Abnormal; Notable for the following components:      Result Value   Sodium 132 (*)    Potassium 3.3 (*)    CO2 19 (*)    Glucose, Bld 258 (*)    BUN 33 (*)    Creatinine, Ser 1.73 (*)    Total Protein 9.0 (*)    GFR, Estimated 44 (*)    All other components within  normal limits  PROTIME-INR - Abnormal; Notable for the following components:   Prothrombin Time 15.5 (*)    All other components within normal limits  I-STAT CG4 LACTIC ACID, ED - Abnormal; Notable for the following components:   Lactic Acid, Venous 3.7 (*)    All other components within normal limits  I-STAT VENOUS BLOOD GAS, ED - Abnormal; Notable for the following components:   pCO2, Ven 37.7 (*)    pO2, Ven 67 (*)    Sodium 134 (*)    Calcium , Ion 1.14 (*)    All other components within normal limits  I-STAT CG4 LACTIC ACID, ED - Abnormal; Notable for the following components:   Lactic Acid, Venous 2.9 (*)    All other components within normal limits  RESP PANEL BY RT-PCR (RSV, FLU A&B, COVID)  RVPGX2  CULTURE, BLOOD (ROUTINE X 2)  CULTURE, BLOOD (ROUTINE X 2)  HSV CULTURE AND TYPING  CBC WITH DIFFERENTIAL/PLATELET  HIV ANTIBODY (ROUTINE TESTING W REFLEX)  URINALYSIS, W/ REFLEX TO CULTURE (INFECTION SUSPECTED)  RPR  BETA-HYDROXYBUTYRIC ACID  GC/CHLAMYDIA PROBE AMP ([place]) NOT AT [facility]    EKG: EKG Interpretation Date/Time:  Saturday February 15 2024 10:21:57 EDT Ventricular Rate:  117 PR Interval:  152 QRS Duration:  77 QT Interval:  323 QTC Calculation: 451 R Axis:   56  Text Interpretation: Sinus tachycardia Anterior infarct, old Borderline repolarization abnormality No significant change since last tracing Confirmed by Dean Clarity [phone]) on 02/15/2024 12:13:20 PM  Radiology: [facility] Chest Port 1 View Result Date: 02/15/2024 CLINICAL DATA:  Questionable sepsis.  Gentle rash EXAM: PORTABLE CHEST 1 VIEW COMPARISON:  04/29/2022 FINDINGS: Normal cardiac silhouette. Chronic elevation of the RIGHT hemidiaphragm. No effusion, infiltrate, or pneumothorax. No acute osseous abnormality. IMPRESSION: 1. No acute cardiopulmonary process. 2. Chronic elevation of the RIGHT hemidiaphragm. Electronically Signed   By: Jackquline Boxer M.D.   On: 02/15/2024 11:01     .Critical  Care  Performed by: Nivia Colon, PA-C Authorized by: Nivia Colon, PA-C   Critical care provider statement:    Critical care time (minutes):  30   Critical care was time spent personally by me on the following activities:  Development of treatment plan with patient or surrogate, discussions with consultants, evaluation of patient's response to treatment, examination of patient, ordering and review of laboratory studies, ordering and review of radiographic studies, ordering and performing treatments and interventions, pulse oximetry, re-evaluation of patient's condition and review of old charts    Medications Ordered in the  ED  lactated ringers  infusion (has no administration in time range)  clotrimazole  (LOTRIMIN ) 1 % cream (has no administration in time range)  lactated ringers  bolus 1,000 mL (1,000 mLs Intravenous New Bag/Given 02/15/24 1027)    And  lactated ringers  bolus 1,000 mL (1,000 mLs Intravenous New Bag/Given 02/15/24 1028)    And  lactated ringers  bolus 1,000 mL (1,000 mLs Intravenous New Bag/Given 02/15/24 1028)  cefTRIAXone  (ROCEPHIN ) 2 g in sodium chloride  0.9 % 100 mL IVPB (0 g Intravenous Stopped 02/15/24 1104)  vancomycin  (VANCOCIN ) IVPB 1000 mg/200 mL premix (0 mg Intravenous Stopped 02/15/24 1132)                                    Medical Decision Making Amount and/or Complexity of Data Reviewed Labs: ordered. Radiology: ordered.  Risk Prescription drug management. Decision regarding hospitalization.   BP (!) 125/113 (BP Location: Right Arm)   Pulse (!) 119   Temp 99.1 F (37.3 C)   Resp 17   Ht 5' 9 (1.753 m)   SpO2 95%   BMI 32.20 kg/m   32:56 AM  63 year old male with history of alcohol use, tobacco use, diabetes, cocaine use, presenting with complaint of a rash.  Patient reports several days ago he received oral sex from a new sexual partner.  Since then he noticed pain and swelling to his penis thus prompting this ER visit.  He notes mild itchiness.  No  fever or chills no chest pain shortness of breath no abdominal pain.  Did not use protection.  Does have known history of diabetes.  On exam this is an elderly male appears older than stated age, ill-appearing but in no acute distress.  He is tachycardic, lungs clear abdomen soft nontender.  Chaperone was present during exam.  On exam, patient has discoloration to the glans of his penis with some bloody appearance.  His foreskin is retracted and edematous suggestive of paraphimosis.  I was able to reduce the paraphimosis on initial exam.  I am unsure if this skin discoloration is secondary to paraphimosis versus STI.  Patient however does have an oral temperature of 99.1, is tachycardic with heart rate of 119 and has a blood pressure of 125/113.  I have initiated sepsis workup and will also screen for STI.  Antibiotic including vancomycin  and Rocephin  have been started.  Patient will receive IV fluid at 30 mL/kg.  -Labs ordered, independently viewed and interpreted by me.  Labs remarkable for elevated lactic acid of 3.7 with normal WBC.  pH normal.  Hyperglycemia with a CBG of 258 with normal anion gap.  Impaired renal function with creatinine 1.73 impaired from baseline of normal renal function previously.  Patient is currently receiving IV fluid at 30 mL/kg. -The patient was maintained on a cardiac monitor.  I personally viewed and interpreted the cardiac monitored which showed an underlying rhythm of: Sinus tachycardia -Imaging independently viewed and interpreted by me and I agree with radiologist's interpretation.  Result remarkable for chest x-ray without acute changes -This patient presents to the ED for concern of penile pain, this involves an extensive number of treatment options, and is a complaint that carries with it a high risk of complications and morbidity.  The differential diagnosis includes paraphimosis, balanitis, herpetic infection, syphilis, abscess, Fournier's gangrene, necrotizing  fasciitis, UTI -Co morbidities that complicate the patient evaluation includes diabetes, positive use -Treatment includes sepsis protocol, broad-spectrum antibiotic,  IV fluid -Reevaluation of the patient after these medicines showed that the patient improved -PCP office notes or outside notes reviewed -Discussion with specialist urologist Dr. Francisca who recommend starting Lotrisone cream as treatment for balanitis.  Since the paraphimosis was reduced, no other emergent treatment for urology necessary at this time.  I have also consulted for medicine resident who agrees to admit patient for further care. -Escalation to admission/observation considered: patient is agreeable with admission.      Final diagnoses:  Sepsis, due to unspecified organism, unspecified whether acute organ dysfunction present [facility])  Paraphimosis  Balanitis    ED Discharge Orders     None          Nivia Colon, PA-C 02/15/24 1214    Nivia Colon, PA-C 02/15/24 1353    Dean Clarity, MD 02/16/24 [phone]

## 2024-02-15 NOTE — Progress Notes (Signed)
 CSW added substance abuse and tobacco cessation resources to patient's AVS.  Niels Portugal, MSW, LCSW Transitions of Care  Clinical Social Worker II [phone]

## 2024-02-15 NOTE — ED Notes (Signed)
 Patient eating dinner.

## 2024-02-15 NOTE — H&P (Addendum)
 Hospital Admission History and Physical Service Pager: [phone]  Patient name: Cody [name] Medical record number: [id number] Date of Birth: 28-Feb-1961 Age: 63 y.o. Gender: male  Primary Care Provider: Vicci Barnie NOVAK, MD Consultants: None Code Status: Full code Preferred Emergency Contact: Jden Want mom, [phone]  Chief Complaint: Burning in genitals  Differential and Medical Decision Making Cody [name] is a 63 y.o. male presenting with a burning genital rash for 3 days. Differential for this patient's presentation of this includes balanoposthitis, STI, and UTI.  Clinically consistent with balanoposthitis given exam.  Suspect there is concurrent urinary tract infection potentially causing urosepsis given his elevated lactic acid and UA findings.  Plan for empiric sepsis treatment and workup with cotesting of STIs.  Very low suspicion for emergent pathology such as Fournier's gangrene. Assessment & Plan Sepsis (HCC) Urinary tract infection Vital signs improved with fluid resuscitation and initiation of antibiotics.  Initially tachycardic, hypertensive, elevated lactic acid.  UA consistent with likely urinary source.  Workup thus far for other sources negative. - Admit to family medicine teaching service, attending Dr. Anders, progressive - Empiric treatment with vancomycin  and ceftriaxone  - 150 mL/h LR - Vitals per floor - Continuous cardiac monitoring - Continuous pulse ox - PT/OT eval - Urine culture pending - AM CBC, BMP - Lactic acid 3.7 --> 2.9 Balanoposthitis Paraphimosis that was reduced in the ED. Recent oral intercourse with a new partner.  ED provider, Lonni Camp, contacted urologist, Dr. Francisca, who recommended Lotrimin  cream as treatment but no other emergent urology treatment necessary at this time. - HSV, Gonorrhea/Chlamydia, RPR pending  - HIV non-reactive - Lotrimin  1% twice daily - Tylenol  every 6 as needed for pain Type 2 diabetes mellitus  without complications (HCC) Unclear what dose of insulin  patient is taking as outpatient.  A1C 5.6. - 5 units Semglee  inpatient  - Likely can discharge only on oral medications - Sensitive SSI - CBG checks with meals, and at night - Will discuss with patient initiating statin - Lipid panel Acute kidney injury (HCC) Likely prerenal in the setting of sepsis and dehydration. - AM BMP - Fluids as above Polysubstance abuse (HCC) Notable history for cocaine and tobacco abuse. - TOC consult - 14 mg nicotine  patch    FEN/GI: At healthy/carb modified VTE Prophylaxis: Lovenox   Disposition: Home pending PT/OT evaluation and clinical improvement  History of Present Illness:  Cody [name] is a 63 y.o. male presenting with a genital rash for 3 days. Patient reports he he got some head 3 days ago. The next day the glans of his penis started hurting. It continued to worsen which is why he came in.  Patient denies fevers, penile discharge.  He endorses urinary frequency that began prior to this sexual encounter.  Patient denies any other recent sexual partners.  He is unaware if this most recent sexual partner had any STIs.  He denies any abdominal pain, nausea, vomiting, or diarrhea.  Denies any difficulty breathing.  In the ED, the patient is tachycardic up to 119 and hypertensive up to 155/109. The patient had a paraphimosis which was reduced in the ED. Blood cultures were drawn and are pending. HSV, gonorrhea/chlamydia, HIV, RPR pending. A chest x-ray showed no acute cardiopulmonary processes and a chronic right hemidiaphragm.  Patient was started on vancomycin  and ceftriaxone  2 g.  Review Of Systems: Per HPI   Pertinent Past Medical History: GERD Arthritis  Allergic rhinitis  Chronic pain   Gout Tendonitis  Pertinent Past Surgical History: ORIF of tibia/fibular fracture (8 years ago) Cystectomy (neck) 1970 I&D left extremity (2017) Exploratory Laparotomy Tonsillectomy     Remainder reviewed in history tab.  Pertinent Social History: Tobacco use: Yes, 1pk every 3 days, since age 28 Alcohol use: Yes, 1 beer every 7 days Other Substance use: Cocaine, once a month, last used yesterday Lives with his mother  Pertinent Family History: Diabetes - mother, father, brother  Lung cancer - father  Remainder reviewed in history tab.   Important Outpatient Medications: Lantus  - Prescription for 10u, patient reports 3 Reports he is only taking insulin , no other medicines.  Remainder reviewed in medication history.   Objective: BP 128/79   Pulse 98   Temp 98.6 F (37 C) (Axillary)   Resp 12   Ht 5' 9 (1.753 m)   Wt 117.9 kg   SpO2 100%   BMI 38.40 kg/m  Exam: General: awake, alert, no acute distress Eyes: Nonicteric, pupils equal round and reactive ENTM: Yellow discoloration of tongue, poor dentition, dry MMM Neck: No JVD present Cardiovascular: tachycardic, no M/R/G Respiratory: CTAB, normal work of breathing bilaterally Gastrointestinal: distended with surgical scars, firm, nontender, bowel sounds present Genitourinary: Scattered patches of erythema along the scrotum, no palpable induration or abscess, no edema, hypopigmented lesions on the shaft of the penis, glans of penis is mottled red, no necrotic tissue noted at this time, mild purulent drainage over the glans, mildly tender to palpation, no tenderness palpation along the inguinal canals bilaterally, Neuro: cranial nerves II-XII intact, 5/5 strength in upper and lower extremities, no focal deficits Psych: Mood appropriate, affect slightly blunted  Labs:  CBC BMET  Recent Labs  Lab 02/15/24 1001 02/15/24 1026  WBC 8.1  --   HGB 15.3 15.6  HCT 45.0 46.0  PLT 334  --    Recent Labs  Lab 02/15/24 1001 02/15/24 1026  NA 132* 134*  K 3.3* 3.5  CL 98  --   CO2 19*  --   BUN 33*  --   CREATININE 1.73*  --   GLUCOSE 258*  --   CALCIUM  9.8  --     Pertinent additional labs  Lactic  acid 3.7 --> 2.9  EKG: My own interpretation: Sinus tachycardia   Imaging Studies Performed:  Imaging Study (ie. Chest x-ray) Impression from Radiologist:  1.  No acute cardiopulmonary process 2.  Chronic elevation of the right hemidiaphragm  My Interpretation: Lungs clear, no effusions, focal consolidations, or hazy opacities.  Right Hemi diaphragm elevated, similar to previous x-ray 2 years ago.   Lennie Raguel MATSU, DO 02/15/2024, 5:35 PM PGY-1, [facility] Family Medicine  FPTS Intern pager: [phone], text pages welcome Secure chat group [facility] Teaching Service   I have verified that the resident's  findings are accurately documented in the resident's note. I have made edits and changes where appropriate, and agree with plan.  Ozell Provencal, MD, PGY-3 [facility] Family Medicine 5:35 PM 02/15/2024

## 2024-02-15 NOTE — ED Triage Notes (Signed)
 Pt c.o rash to his genitals x 3 days, denies burning with urination

## 2024-02-16 DIAGNOSIS — N481 Balanitis: Secondary | ICD-10-CM | POA: Diagnosis not present

## 2024-02-16 LAB — RPR: RPR Ser Ql: NONREACTIVE

## 2024-02-16 LAB — LIPID PANEL
Cholesterol: 186 mg/dL (ref 0–200)
HDL: 23 mg/dL — ABNORMAL LOW (ref >40)
LDL Cholesterol: 120 mg/dL — ABNORMAL HIGH (ref 0–99)
Total CHOL/HDL Ratio: 8.1 ratio
Triglycerides: 216 mg/dL — ABNORMAL HIGH (ref <150)
VLDL: 43 mg/dL — ABNORMAL HIGH (ref 0–40)

## 2024-02-16 LAB — CBC
HCT: 35.1 % — ABNORMAL LOW (ref 39.0–52.0)
Hemoglobin: 12.1 g/dL — ABNORMAL LOW (ref 13.0–17.0)
MCH: 32.6 pg (ref 26.0–34.0)
MCHC: 34.5 g/dL (ref 30.0–36.0)
MCV: 94.6 fL (ref 80.0–100.0)
Platelets: 225 K/uL (ref 150–400)
RBC: 3.71 MIL/uL — ABNORMAL LOW (ref 4.22–5.81)
RDW: 13.2 % (ref 11.5–15.5)
WBC: 6.8 K/uL (ref 4.0–10.5)
nRBC: 0 % (ref 0.0–0.2)

## 2024-02-16 LAB — GLUCOSE, CAPILLARY
Glucose-Capillary: 136 mg/dL — ABNORMAL HIGH (ref 70–99)
Glucose-Capillary: 182 mg/dL — ABNORMAL HIGH (ref 70–99)
Glucose-Capillary: 105 mg/dL — ABNORMAL HIGH (ref 70–99)
Glucose-Capillary: 135 mg/dL — ABNORMAL HIGH (ref 70–99)

## 2024-02-16 LAB — BASIC METABOLIC PANEL WITH GFR
Anion gap: 9 (ref 5–15)
BUN: 17 mg/dL (ref 8–23)
CO2: 25 mmol/L (ref 22–32)
Calcium: 8.9 mg/dL (ref 8.9–10.3)
Chloride: 101 mmol/L (ref 98–111)
Creatinine, Ser: 0.99 mg/dL (ref 0.61–1.24)
GFR, Estimated: >60 mL/min (ref >60)
Glucose, Bld: 131 mg/dL — ABNORMAL HIGH (ref 70–99)
Potassium: 3.2 mmol/L — ABNORMAL LOW (ref 3.5–5.1)
Sodium: 135 mmol/L (ref 135–145)

## 2024-02-16 MED ORDER — ORAL CARE MOUTH RINSE: 15.0000 mL | OROMUCOSAL | Status: DC | PRN

## 2024-02-16 MED ORDER — ALUM & MAG HYDROXIDE-SIMETH 200-200-20 MG/5ML PO SUSP
30.0000 mL | Freq: Once | ORAL | Status: AC
  Administered 2024-02-16: 30 mL via ORAL
  Filled 2024-02-16: qty 30

## 2024-02-16 MED ORDER — POTASSIUM CHLORIDE CRYS ER 20 MEQ PO TBCR
40.0000 meq | EXTENDED_RELEASE_TABLET | Freq: Once | ORAL | Status: AC
  Administered 2024-02-16: 40 meq via ORAL
  Filled 2024-02-16: qty 2

## 2024-02-16 MED ORDER — ACETAMINOPHEN 325 MG PO TABS
650.0000 mg | ORAL_TABLET | Freq: Four times a day (QID) | ORAL | Status: DC
  Administered 2024-02-16 – 2024-02-17 (×6): 650 mg via ORAL
  Filled 2024-02-16 (×6): qty 2

## 2024-02-16 MED ORDER — ATORVASTATIN CALCIUM 10 MG PO TABS
20.0000 mg | ORAL_TABLET | Freq: Every day | ORAL | Status: DC
  Administered 2024-02-16 – 2024-02-18 (×3): 20 mg via ORAL
  Filled 2024-02-16 (×3): qty 2

## 2024-02-16 NOTE — Hospital Course (Addendum)
 Cody [name] is a 63 y.o. year old with a history of T2DM, GERD, gout who presented with burning genital rash and [facility] for balanoposthitis and sepsis 2/2 UTI.  Sepsis  UTI Elevated lactic acid and UA indicated UTI on admission.  Sepsis iso UTI suspected.  UCx pending at time of discharge; BCx showed NGTD at 3 days. Empirically treated with vancomycin  (7/5-7/7) and ceftriaxone  (7/5-7/8), transitioned to cephalexin  500 mg QID (7/8-7/11) for total 7 day antibiotics course.  Balanoposthitis Patient with new painful genital rash (hypopigmented friable lesions over erythematous base on glans) after receiving oral sex from new partner outpatient.  Had paraphimosis that was reduced in the ED; EDP consulted Urology who recommended Lotrimin  cream 1% BID without need for emergent urology intervention.  STI labs with RPR negative, HIV negative, HSV negative, GC/chlamydia negative.  Unclear etiology of rash, but possible fungal infection versus untested STI.  Lotrimin  continued at discharge through 7/11 for total 7 day course.  Pain resolved and rash improving at time of discharge.  T2DM A1c 11.7 ~1 year ago.  Unclear insulin  regimen outpatient, prescribed 10 units but reported taking 3 units.  Treated with Semglee  5u inpatient; A1c resulted at 5.6 and patient was discharged on metformin  500 mg BID.  Other chronic conditions were medically managed with home medications and formulary alternatives as necessary (polysubstance abuse (cocaine, tobacco)).  PCP Follow-up Recommendations: Follow up for resolution of rash, consider extending Lotrimin  duration if needed. Follow up response to metformin . 2.   Suspect intermittent oxygen requirement is due to probable OSA and/or COPD given smoking history and apneic episode overnight.  Recommend outpatient follow up with PFTs and/or sleep study. 3.   Recommend CBC in 1 week.

## 2024-02-16 NOTE — Assessment & Plan Note (Addendum)
 Resolved this AM with IVF.  Likely prerenal in the setting of sepsis and dehydration. - AM BMP

## 2024-02-16 NOTE — Assessment & Plan Note (Addendum)
 Unclear what dose of insulin  patient is taking as outpatient.  A1C 5.6 this admission and LDL 120.  Will need to start statin. - 5 units LAI, will aim to discharge solely on oral meds - Sensitive SSI - CBGs ACHS - Initiate atorvastatin  20 mg daily

## 2024-02-16 NOTE — Progress Notes (Signed)
 Patient's oxygen saturation dropped to the 60's while asleep. 3L oxygen placed. Sats are now 99%.

## 2024-02-16 NOTE — Assessment & Plan Note (Deleted)
 Vital signs improved with fluid resuscitation and initiation of antibiotics.  Initially tachycardic, hypertensive, elevated lactic acid.  UA consistent with likely urinary source.  Workup thus far for other sources negative. - Admit to family medicine teaching service, attending Dr. Anders, progressive - Empiric treatment with vancomycin  and ceftriaxone  - 150 mL/h LR - Vitals per floor - Continuous cardiac monitoring - Continuous pulse ox - PT/OT eval - Urine culture pending - AM CBC, BMP - Lactic acid 3.7 --> 2.9

## 2024-02-16 NOTE — Progress Notes (Signed)
 Daily Progress Note Intern Pager: [phone]  Patient name: XZAVIAR MALOOF Medical record number: [id number] Date of birth: [birth year] Age: 63 y.o. Gender: male  Primary Care Provider: Vicci Barnie NOVAK, MD Consultants: None Code Status: Full code  Pt Overview and Major Events to Date:  7/5 - Admitted, started vanc and CTX   Medical Decision Making Xue Low is a 63 year old male who presented with 3 days of burning genital rash and balanoposthitis suspected due to UTI and/or STI.  Did meet sepsis criteria admission, sepsis now resolved.  Pertinent PMH/PSH includes new sexual partner recently, gout, GERD, cocaine use, and T2DM.  Still with painful lesions and etiology of infection unclear, awaiting test results.  Suspect intermittent oxygen requirement is due to probable OSA and/or COPD given smoking history and apneic episode overnight; recommend outpatient follow up. Assessment & Plan Urinary tract infection Sepsis (HCC) (Resolved: 02/16/2024) Sepsis resolved and VSS.  Awaiting UCx and following BCx. - Continue empiric treatment with vancomycin  and ceftriaxone  pending sensitivities - Continuous cardiac monitoring & pulse oximetry - BCx NGTD @ 1 day - Urine culture pending - Wound culture, ordered, but not drawn and now pretreated - AM CBC, BMP Balanitis Paraphimosis that was reduced in the ED. Urology previously consulted in EDP, lotrimin  cream sole recommendation.  Recent oral intercourse with a new partner.  Genital rash.  Awaiting STI labs. - f/u HSV, GC/chlamydia, RPR - Lotrimin  1% twice daily - Acetaminophen  Q6h PRN for pain Type 2 diabetes mellitus without complications (HCC) Unclear what dose of insulin  patient is taking as outpatient.  A1C 5.6 this admission and LDL 120.  Will need to start statin. - 5 units LAI, will aim to discharge solely on oral meds - Sensitive SSI - CBGs ACHS - Initiate atorvastatin  20 mg daily Polysubstance abuse (HCC) Notable history  for cocaine and tobacco abuse. - TOC consult - 14 mg nicotine  patch Acute kidney injury (HCC) (Resolved: 02/16/2024) Resolved this AM with IVF.  Likely prerenal in the setting of sepsis and dehydration. - AM BMP  FEN/GI: Carb modified PPx: Lovenox  Dispo: Home with home health pending clinical improvement . Barriers include STI testing, UCx and sensitivities to deescalate Abx.  Subjective:  This AM, patient reports he is having 8/10 penile pain, not improved from admission.  Notes needed bleeding of penile head.  States he is still able to urinate without obstructive difficulty though does still have some mild dysuria.  Is overall uncomfortable.   Objective: Temp:  [98.1 F (36.7 C)-99.1 F (37.3 C)] 98.6 F (37 C) (07/05 2313) Pulse Rate:  [68-119] 81 (07/06 0214) Resp:  [12-18] 12 (07/06 0214) BP: (105-155)/(73-113) 105/77 (07/06 0214) SpO2:  [90 %-100 %] 95 % (07/06 0214) Weight:  [117.9 kg] 117.9 kg (07/05 1034)  Physical Exam: General: Appears older than stated age, resting in bed, NAD, alert and at baseline. Cardiovascular: Regular rate and rhythm. Normal S1/S2. No murmurs, rubs, or gallops appreciated. Pulmonary: Clear bilaterally to auscultation; no wheezes, crackles, or rhonchi. Normal WOB on room air, no accessory muscle usage. Abdominal: Normoactive bowel sounds, nondistended. No tenderness to deep or light palpation. No rebound or guarding. GU: Mildly edematous penile glans with reduced balanoposthitis, small amount of white purulent discharge mixed with drying blood from glans hypopigmented lesions, shaft with similar lesions, mildly tender to palpation.  No testicular TTP, no inguinal TTP. Extremities: No peripheral edema bilaterally. Capillary refill <2 seconds. Psych: Flat affect.  Laboratory: Most recent CBC Lab Results  Component Value  Date   WBC 6.8 02/16/2024   HGB 12.1 (L) 02/16/2024   HCT 35.1 (L) 02/16/2024   MCV 94.6 02/16/2024   PLT 225 02/16/2024    Most recent BMP    Latest Ref Rng & Units 02/16/2024    4:23 AM  BMP  Glucose 70 - 99 mg/dL 868   BUN 8 - 23 mg/dL 17   Creatinine 9.38 - 1.24 mg/dL 9.00   Sodium 864 - 854 mmol/L 135   Potassium 3.5 - 5.1 mmol/L 3.2   Chloride 98 - 111 mmol/L 101   CO2 22 - 32 mmol/L 25   Calcium  8.9 - 10.3 mg/dL 8.9     Other pertinent labs: - LDL 43 - Hgb 15.6>12.1 (likely hemodilutional) - HSV, GC/chlamydia, RPR pending  New Imaging/Diagnostic Tests: - None  Mikaylee Arseneau, MD 02/16/2024, 7:20 AM  PGY-2, [facility] FPTS Intern pager: [phone], text pages welcome Secure chat group [facility] Teaching Service

## 2024-02-16 NOTE — Assessment & Plan Note (Signed)
 Notable history for cocaine and tobacco abuse. - TOC consult - 14 mg nicotine  patch

## 2024-02-16 NOTE — Assessment & Plan Note (Addendum)
 Sepsis resolved and VSS.  Awaiting UCx and following BCx. - Continue empiric treatment with vancomycin  and ceftriaxone  pending sensitivities - Continuous cardiac monitoring & pulse oximetry - BCx NGTD @ 1 day - Urine culture pending - Wound culture, ordered, but not drawn and now pretreated - AM CBC, BMP

## 2024-02-16 NOTE — Evaluation (Signed)
 Physical Therapy Evaluation Patient Details Name: Cody [name] MRN: [id number] DOB: 07/24/1961 Today's Date: 02/16/2024  History of Present Illness  Cody [name] is a 63 y.o. male admitted 02/15/24 for sepsis 2/2 to UTI along with balanoposthitis. PMHx: T2DM, polysubstance use, GERD, and OA.  Clinical Impression  Pt admitted with above diagnosis. PTA, pt was modI for functional mobility using rollator, modI for ADLs using adaptive equipment, and independent with IADLs. He lives with his mom in a one story house with a level entry. Pt currently with functional limitations due to the deficits listed below (see PT Problem List). He required CGA-minA for bed mobility, minA x2 for sit<>stand using RW, and supervision-CGA for gait using RW. Pt ambulated ~355ft in the hallway with cues for PLB technique. Pt will benefit from acute skilled PT to increase his independence and safety with mobility to allow discharge. Feel he is close to baseline function, anticipate no follow-up PT needs.     If plan is discharge home, recommend the following: A little help with walking and/or transfers;A little help with bathing/dressing/bathroom;Assistance with cooking/housework   Can travel by private vehicle        Equipment Recommendations None recommended by PT  Recommendations for Other Services       Functional Status Assessment Patient has had a recent decline in their functional status and demonstrates the ability to make significant improvements in function in a reasonable and predictable amount of time.     Precautions / Restrictions Precautions Precautions: Fall Recall of Precautions/Restrictions: Intact Precaution/Restrictions Comments: watch SpO2 Restrictions Weight Bearing Restrictions Per Provider Order: No      Mobility  Bed Mobility Overal bed mobility: Needs Assistance Bed Mobility: Supine to Sit, Sit to Supine     Supine to sit: HOB elevated, Contact guard Sit to supine: HOB  elevated, Min assist   General bed mobility comments: Pt reports his bed at home raises/lowers, allowed pt to leave [facility] elevated to simulate home environment. He sat up on R side of bed with increased time, no physical assist. CGA for safety. Returning to bed pt required assist to bring BLE back into bed.    Transfers Overall transfer level: Needs assistance Equipment used: Rolling walker (2 wheels) Transfers: Sit to/from Stand Sit to Stand: From elevated surface, Min assist, +2 physical assistance           General transfer comment: Pt stood from raised bed height. Cued proper hand placement using RW. Powered up with minA x2 to boost into standing. Increased time to achieve upright posture. Good eccentric control with sitting. Pt declined to transfer to recliner chair and sit up for a few.    Ambulation/Gait Ambulation/Gait assistance: Contact guard assist, Supervision, +2 safety/equipment Gait Distance (Feet): 300 Feet Assistive device: Rolling walker (2 wheels) Gait Pattern/deviations: Step-to pattern, Decreased step length - right, Decreased step length - left, Decreased stride length, Decreased dorsiflexion - right, Decreased dorsiflexion - left, Trunk flexed Gait velocity: reduced Gait velocity interpretation: <1.31 ft/sec, indicative of household ambulator   General Gait Details: Pt ambulated with short slow steps. He maintained a fwd lean likely d/t positioning he is used to with using rollator. Pt kept shoulders elevated up to ear, cued relaxation. He navigated the room/hallway well, no LOB. Initially CGA for safety/stability able to lessen to close supervision. Cued PLB technique throughout mobility.  Stairs            Wheelchair Mobility     Tilt Bed  Modified Rankin (Stroke Patients Only)       Balance Overall balance assessment: Mild deficits observed, not formally tested                                           Pertinent  Vitals/Pain Pain Assessment Pain Assessment: No/denies pain    Home Living Family/patient expects to be discharged to:: Private residence Living Arrangements: Parent (Mother) Available Help at Discharge: Family;Available PRN/intermittently Type of Home: House Home Access: Level entry       Home Layout: One level Home Equipment: Rollator (4 wheels);Other (comment);Cane - single point;Adaptive equipment (supplemental oxygen)      Prior Function Prior Level of Function : Independent/Modified Independent;Driving             Mobility Comments: ModI using rollator. 1 fall tripping over his feet. Reports not leaving the house much but walking around in side a fair amount. ADLs Comments: ModI with ADLs using adaptive equipment. Indep with IADLs. Drives.     Extremity/Trunk Assessment   Upper Extremity Assessment Upper Extremity Assessment: Defer to OT evaluation    Lower Extremity Assessment Lower Extremity Assessment: Overall WFL for tasks assessed    Cervical / Trunk Assessment Cervical / Trunk Assessment: Other exceptions Cervical / Trunk Exceptions: Body Habitus  Communication   Communication Communication: Impaired Factors Affecting Communication: Reduced clarity of speech    Cognition Arousal: Alert Behavior During Therapy: Flat affect   PT - Cognitive impairments: No family/caregiver present to determine baseline                       PT - Cognition Comments: Pt A,Ox4. He took increased time for processing. Following commands: Intact       Cueing Cueing Techniques: Verbal cues     General Comments General comments (skin integrity, edema, etc.): Pt greeted on 3L, titrated to RA. Pt tolerated it at rest 88-90% SpO2. During mobility desaturated to 84% SpO2, requiring bump up to 1L to maintain >93% SpO2. Cued PLB technique throughout.    Exercises     Assessment/Plan    PT Assessment Patient needs continued PT services  PT Problem List Decreased  mobility;Decreased balance       PT Treatment Interventions DME instruction;Gait training;Functional mobility training;Therapeutic activities;Therapeutic exercise;Balance training;Patient/family education    PT Goals (Current goals can be found in the Care Plan section)  Acute Rehab PT Goals Patient Stated Goal: Return Home PT Goal Formulation: With patient Time For Goal Achievement: 03/01/24 Potential to Achieve Goals: Good    Frequency Min 2X/week     Co-evaluation PT/OT/SLP Co-Evaluation/Treatment: Yes Reason for Co-Treatment: To address functional/ADL transfers PT goals addressed during session: Mobility/safety with mobility;Balance         AM-PAC PT 6 Clicks Mobility  Outcome Measure Help needed turning from your back to your side while in a flat bed without using bedrails?: A Little Help needed moving from lying on your back to sitting on the side of a flat bed without using bedrails?: A Little Help needed moving to and from a bed to a chair (including a wheelchair)?: A Little Help needed standing up from a chair using your arms (e.g., wheelchair or bedside chair)?: A Lot Help needed to walk in hospital room?: A Little Help needed climbing 3-5 steps with a railing? : A Little 6 Click Score: 17  End of Session Equipment Utilized During Treatment: Gait belt;Oxygen Activity Tolerance: Patient tolerated treatment well Patient left: in bed;with call bell/phone within reach;with bed alarm set Nurse Communication: Mobility status PT Visit Diagnosis: Other abnormalities of gait and mobility (R26.89)    Time: 8443-8375 PT Time Calculation (min) (ACUTE ONLY): 28 min   Charges:   PT Evaluation $PT Eval Moderate Complexity: 1 Mod   PT General Charges $$ ACUTE PT VISIT: 1 Visit         Randall SAUNDERS, PT, DPT Acute Rehabilitation Services Office: [phone] Secure Chat Preferred  Delon CHRISTELLA Callander 02/16/2024, 4:55 PM

## 2024-02-16 NOTE — Progress Notes (Signed)
 Patient with heartburn. Maalox given. MD aware.

## 2024-02-16 NOTE — Progress Notes (Signed)
 SATURATION QUALIFICATIONS: (This note is used to comply with regulatory documentation for home oxygen)  Patient Saturations on Room Air at Rest = 90%  Patient Saturations on Room Air while Ambulating = 84%  Patient Saturations on 1 Liters of oxygen while Ambulating = 93%  Please briefly explain why patient needs home oxygen: Pt desaturated with functional mobility on RA. He required 1L of supplemental oxygen to maintain SpO2 >88%.   Randall SAUNDERS, PT, DPT Acute Rehabilitation Services Office: [phone] Secure Chat Preferred

## 2024-02-16 NOTE — Assessment & Plan Note (Addendum)
 Paraphimosis that was reduced in the ED. Urology previously consulted in EDP, lotrimin  cream sole recommendation.  Recent oral intercourse with a new partner.  Genital rash.  Awaiting STI labs. - f/u HSV, GC/chlamydia, RPR - Lotrimin  1% twice daily - Acetaminophen  Q6h PRN for pain

## 2024-02-16 NOTE — Evaluation (Signed)
 Occupational Therapy Evaluation Patient Details Name: Cody [name] MRN: [id number] DOB: Oct 15, 1960 Today's Date: 02/16/2024   History of Present Illness   Cody [name] is a 63 y.o. male admitted 02/15/24 for sepsis 2/2 to UTI along with balanoposthitis. PMHx: T2DM, polysubstance use, GERD, and OA.     Clinical Impressions At baseline, pt completes ADLs Mod I with use of AE for LB ADLs, is Ind with IADLs, and performs functional mobility Mod I with a Rollator. Pt now presents with decreased activity tolerance, mildly decreased balance, and decreased safety and independence with functional tasks. Pt currently demonstrates ability to complete UB ADLs Ind to Set up/Supervision, LB ADLs with Min to Max assist without the use of AE, and functional mobility/transfers with a RW with Contact guard to Min assist. Plan for OT to reasses pt funcitonal level with LB ADLs with use of AE next session. Pt greeted on 3L, titrated to RA. Pt tolerated it at rest 88-90% SpO2. During mobility desaturated to 84% SpO2, requiring bump up to 1L to maintain >93% SpO2. Cued PLB technique throughout. Pt participated well in session and is motivated to return to PLOF. Pt will benefit from acute skilled OT services to address deficits outlined below and increase safety and independence with functional tasks. No post acute skilled OT needs are anticipated at this time.      If plan is discharge home, recommend the following:   A little help with walking and/or transfers;A lot of help with bathing/dressing/bathroom;Assistance with cooking/housework;Assist for transportation;Help with stairs or ramp for entrance     Functional Status Assessment   Patient has had a recent decline in their functional status and demonstrates the ability to make significant improvements in function in a reasonable and predictable amount of time.     Equipment Recommendations   None recommended by OT (Pt already has needed equipment)      Recommendations for Other Services         Precautions/Restrictions   Precautions Precautions: Fall Recall of Precautions/Restrictions: Intact Precaution/Restrictions Comments: watch SpO2 Restrictions Weight Bearing Restrictions Per Provider Order: No     Mobility Bed Mobility Overal bed mobility: Needs Assistance Bed Mobility: Supine to Sit, Sit to Supine     Supine to sit: HOB elevated, Contact guard Sit to supine: HOB elevated, Min assist   General bed mobility comments: Pt reports his bed at home raises/lowers, allowed pt to leave [facility] elevated to simulate home environment. He sat up on R side of bed with increased time, no physical assist. CGA for safety. Returning to bed pt required assist to bring BLE back into bed.    Transfers Overall transfer level: Needs assistance Equipment used: Rolling walker (2 wheels) Transfers: Sit to/from Stand, Bed to chair/wheelchair/BSC Sit to Stand: From elevated surface, Min assist, +2 physical assistance     Step pivot transfers: Min assist, Contact guard assist, +2 physical assistance, +2 safety/equipment     General transfer comment: Min assist +2 to power up then CGA +2 for safety/equipment once in standing      Balance Overall balance assessment: Mild deficits observed, not formally tested                                         ADL either performed or assessed with clinical judgement   ADL Overall ADL's : Needs assistance/impaired Eating/Feeding: Independent;Sitting   Grooming: Set up;Supervision/safety;Sitting  Upper Body Bathing: Supervision/ safety;Set up;Sitting   Lower Body Bathing: Moderate assistance;Maximal assistance;Sitting/lateral leans;Sit to/from stand;Cueing for compensatory techniques   Upper Body Dressing : Set up;Sitting   Lower Body Dressing: Moderate assistance;Maximal assistance;Sitting/lateral leans;Sit to/from stand;Cueing for compensatory techniques   Toilet Transfer:  Contact guard assist;Minimal assistance;+2 for physical assistance;Ambulation;BSC/3in1;Rolling walker (2 wheels);+2 for safety/equipment (Min assist +2 to power up then CGA +2 for safety/equipment once in standing) Toilet Transfer Details (indicate cue type and reason): simulated at EOB Toileting- Clothing Manipulation and Hygiene: Minimal assistance;Sit to/from stand       Functional mobility during ADLs: Contact guard assist;Rolling walker (2 wheels) General ADL Comments: Pt reports use of AE for LB ADLs at home. However, did not have AE present this session. Plan to reasses pt funcitonal level with LB ADLs with use of AE next session.     Vision Baseline Vision/History: 1 Wears glasses Ability to See in Adequate Light: 0 Adequate (with glasses) Patient Visual Report: No change from baseline       Perception         Praxis         Pertinent Vitals/Pain Pain Assessment Pain Assessment: No/denies pain     Extremity/Trunk Assessment Upper Extremity Assessment Upper Extremity Assessment: Right hand dominant;Overall [facility] for tasks assessed   Lower Extremity Assessment Lower Extremity Assessment: Defer to PT evaluation   Cervical / Trunk Assessment Cervical / Trunk Assessment: Other exceptions Cervical / Trunk Exceptions: increased body habitus   Communication Communication Communication: Impaired Factors Affecting Communication: Reduced clarity of speech;Other (comment) (increased time for processing)   Cognition Arousal: Alert Behavior During Therapy: Flat affect Cognition: Cognition impaired, No family/caregiver present to determine baseline     Awareness: Intellectual awareness intact, Online awareness intact   Attention impairment (select first level of impairment): Alternating attention Executive functioning impairment (select all impairments): Organization, Problem solving OT - Cognition Comments: Pt AAOx4 and pleasant throughout session. Pt cognition largely [facility]  for tasks assessed but with noted deficits described above                 Following commands: Intact       Cueing  General Comments   Cueing Techniques: Verbal cues  Pt greeted on 3L, titrated to RA. Pt tolerated it at rest 88-90% SpO2. During mobility desaturated to 84% SpO2, requiring bump up to 1L to maintain >93% SpO2. Cued PLB technique throughout   Exercises     Shoulder Instructions      Home Living Family/patient expects to be discharged to:: Private residence Living Arrangements: Parent (mother) Available Help at Discharge: Family;Available PRN/intermittently Type of Home: House Home Access: Level entry     Home Layout: One level     Bathroom Shower/Tub: Chief Strategy Officer: Standard     Home Equipment: Rollator (4 wheels);Other (comment);Cane - single point;Adaptive equipment (supplemental oxygen) Adaptive Equipment: Reacher;Sock aid;Long-handled shoe horn        Prior Functioning/Environment Prior Level of Function : Independent/Modified Independent;Driving             Mobility Comments: ModI using rollator. 1 fall tripping over his feet. Reports not leaving the house much but walking around in side a fair amount. ADLs Comments: ModI with ADLs using adaptive equipment. Indep with IADLs. Drives. Enjoys watching football.    OT Problem List: Decreased activity tolerance;Impaired balance (sitting and/or standing)   OT Treatment/Interventions: Self-care/ADL training;DME and/or AE instruction;Energy conservation;Therapeutic activities;Patient/family education;Balance training      OT  Goals(Current goals can be found in the care plan section)   Acute Rehab OT Goals Patient Stated Goal: to return home OT Goal Formulation: With patient Time For Goal Achievement: 03/01/24 Potential to Achieve Goals: Good ADL Goals Pt Will Perform Grooming: with modified independence;standing Pt Will Perform Lower Body Bathing: with modified  independence;with adaptive equipment;sitting/lateral leans;sit to/from stand Pt Will Perform Lower Body Dressing: with modified independence;with adaptive equipment;sitting/lateral leans;sit to/from stand Pt Will Transfer to Toilet: with modified independence;ambulating;regular height toilet (with least restrictive AD) Pt Will Perform Toileting - Clothing Manipulation and hygiene: with modified independence;sit to/from stand;sitting/lateral leans   OT Frequency:  Min 1X/week    Co-evaluation PT/OT/SLP Co-Evaluation/Treatment: Yes Reason for Co-Treatment: To address functional/ADL transfers PT goals addressed during session: Mobility/safety with mobility;Balance OT goals addressed during session: ADL's and self-care      AM-PAC OT 6 Clicks Daily Activity     Outcome Measure Help from another person eating meals?: None Help from another person taking care of personal grooming?: A Little Help from another person toileting, which includes using toliet, bedpan, or urinal?: A Little Help from another person bathing (including washing, rinsing, drying)?: A Lot Help from another person to put on and taking off regular upper body clothing?: A Little Help from another person to put on and taking off regular lower body clothing?: A Lot 6 Click Score: 17   End of Session Equipment Utilized During Treatment: Gait belt;Rolling walker (2 wheels);Oxygen Nurse Communication: Mobility status;Other (comment) (O2 sats)  Activity Tolerance: Patient tolerated treatment well Patient left: in bed;with call bell/phone within reach;with bed alarm set  OT Visit Diagnosis: Unsteadiness on feet (R26.81);Other abnormalities of gait and mobility (R26.89);Other (comment) (decreased activity tolerance)                Time: 8443-8375 OT Time Calculation (min): 28 min Charges:  OT General Charges $OT Visit: 1 Visit OT Evaluation $OT Eval Moderate Complexity: 1 Mod  Margarie Rockey HERO., OTR/L, MA Acute  Rehab [phone]   Margarie FORBES Horns 02/16/2024, 6:22 PM

## 2024-02-17 DIAGNOSIS — N481 Balanitis: Secondary | ICD-10-CM | POA: Diagnosis not present

## 2024-02-17 LAB — GLUCOSE, CAPILLARY
Glucose-Capillary: 142 mg/dL — ABNORMAL HIGH (ref 70–99)
Glucose-Capillary: 133 mg/dL — ABNORMAL HIGH (ref 70–99)
Glucose-Capillary: 112 mg/dL — ABNORMAL HIGH (ref 70–99)
Glucose-Capillary: 139 mg/dL — ABNORMAL HIGH (ref 70–99)

## 2024-02-17 LAB — GC/CHLAMYDIA PROBE AMP (~~LOC~~) NOT AT ARMC
Chlamydia: NEGATIVE
Comment: NEGATIVE
Comment: NORMAL
Neisseria Gonorrhea: NEGATIVE

## 2024-02-17 LAB — CBC
HCT: 34 % — ABNORMAL LOW (ref 39.0–52.0)
Hemoglobin: 11.1 g/dL — ABNORMAL LOW (ref 13.0–17.0)
MCH: 32.1 pg (ref 26.0–34.0)
MCHC: 32.6 g/dL (ref 30.0–36.0)
MCV: 98.3 fL (ref 80.0–100.0)
Platelets: 247 K/uL (ref 150–400)
RBC: 3.46 MIL/uL — ABNORMAL LOW (ref 4.22–5.81)
RDW: 13.3 % (ref 11.5–15.5)
WBC: 5.7 K/uL (ref 4.0–10.5)
nRBC: 0 % (ref 0.0–0.2)

## 2024-02-17 LAB — BASIC METABOLIC PANEL WITH GFR
Anion gap: 9 (ref 5–15)
BUN: 13 mg/dL (ref 8–23)
CO2: 26 mmol/L (ref 22–32)
Calcium: 9 mg/dL (ref 8.9–10.3)
Chloride: 102 mmol/L (ref 98–111)
Creatinine, Ser: 0.97 mg/dL (ref 0.61–1.24)
GFR, Estimated: >60 mL/min (ref >60)
Glucose, Bld: 127 mg/dL — ABNORMAL HIGH (ref 70–99)
Potassium: 3.7 mmol/L (ref 3.5–5.1)
Sodium: 137 mmol/L (ref 135–145)

## 2024-02-17 LAB — HSV CULTURE AND TYPING

## 2024-02-17 MED ORDER — MELATONIN 3 MG PO TABS
3.0000 mg | ORAL_TABLET | Freq: Every evening | ORAL | Status: DC | PRN
  Administered 2024-02-17: 3 mg via ORAL
  Filled 2024-02-17: qty 1

## 2024-02-17 MED ORDER — VANCOMYCIN HCL IN DEXTROSE 1-5 GM/200ML-% IV SOLN: 1000.0000 mg | Freq: Two times a day (BID) | INTRAVENOUS | Status: DC

## 2024-02-17 NOTE — Assessment & Plan Note (Addendum)
 Unclear what dose of insulin  patient is taking as outpatient.  A1C 5.6 this admission and LDL 120.  Will need to start statin. - 5 units LAI, will aim to discharge solely on oral meds - Sensitive SSI - CBGs ACHS - Initiate atorvastatin  20 mg daily

## 2024-02-17 NOTE — Progress Notes (Signed)
 Daily Progress Note Intern Pager: [phone]  Patient name: Cody [name] Medical record number: [id number] Date of birth: 1961/06/09 Age: 63 y.o. Gender: male  Primary Care Provider: Vicci Barnie NOVAK, MD Consultants: None Code Status: FULL  Pt Overview and Major Events to Date:  7/5-admitted, started Vanc and ceftriaxone   Medical Decision Making:  He is a 63 year old male who presented with 3 days of burning genital rash and balanoposthitis suspected due to UTI and/or STI.  Met sepsis criteria on admission, sepsis now resolved.  Pertinent history includes new sexual partner, gout, GERD, cocaine use, and T2DM.  Painful lesions showing symptomatic improvement. Etiology of infection still unclear, awaiting test results.  Suspect intermittent oxygen requirement is due to probable OSA and/or COPD given smoking history and apneic event overnight, recommend outpatient follow-up. Assessment & Plan Urinary tract infection Sepsis resolved and VSS.  Awaiting Ucx and following BCx. No dysuria or hesitancy - Continue empiric treatment with ceftriaxone  pending sensitivities, d/c vancomycin  - Continuous cardiac monitoring & pulse oximetry - BCx NGTD @ 2 day - Sample for urine culture initially lost, pending results starting 7/7.  - Wound culture, ordered, but not drawn and now pretreated - AM CBC, BMP Balanitis Paraphimosis that was reduced in the ED. Urology previously consulted in EDP, lotrimin  cream sole recommendation.  Recent oral intercourse with a new partner.  Genital rash.  Awaiting STI labs.  Penile pain significantly improved today - HSV, GC/chlamydia pending on 7/7, RPR negative - Lotrimin  1% twice daily - Acetaminophen  Q6h PRN for pain Type 2 diabetes mellitus without complications (HCC) Unclear what dose of insulin  patient is taking as outpatient.  A1C 5.6 this admission and LDL 120.  Will need to start statin. - 5 units LAI, will aim to discharge solely on oral meds -  Sensitive SSI - CBGs ACHS - Initiate atorvastatin  20 mg daily Polysubstance abuse (HCC) Notable history for cocaine and tobacco abuse. - TOC consult - 14 mg nicotine  patch  FEN/GI: Carb Modified PPx: Lovenox  Dispo:Home with home health pending clinical improvement . Barriers include Ucx, G/C chlamydia and HSV, deescalate antibiotics.   Subjective:  Patient reports noted improvement in penile pain, denies hesitancy or dysuria. NAEON.   Objective: Temp:  [98 F (36.7 C)-98.4 F (36.9 C)] 98.4 F (36.9 C) (07/07 0215) Resp:  [13-17] 16 (07/07 0215) BP: (117-146)/(72-82) 133/72 (07/07 0215) SpO2:  [96 %-100 %] 100 % (07/07 0215) Physical Exam: General: NAD, patient awake, alert and communicating well HEENT: NCAT, poor dentition Cardiovascular: RRR, no m/r/g Respiratory: CTA b/l, normal work of breathing Abdomen: Soft abdomen with no TTP.  GU: Mildly edematous glans with mild erythema and hypopigmented lesions. Small amount of dried blood without active bleeding or discharge.  Extremities: No peripheral edema  Laboratory: Most recent CBC Lab Results  Component Value Date   WBC 5.7 02/17/2024   HGB 11.1 (L) 02/17/2024   HCT 34.0 (L) 02/17/2024   MCV 98.3 02/17/2024   PLT 247 02/17/2024   Most recent BMP    Latest Ref Rng & Units 02/17/2024    5:00 AM  BMP  Glucose 70 - 99 mg/dL 872   BUN 8 - 23 mg/dL 13   Creatinine 9.38 - 1.24 mg/dL 9.02   Sodium 864 - 854 mmol/L 137   Potassium 3.5 - 5.1 mmol/L 3.7   Chloride 98 - 111 mmol/L 102   CO2 22 - 32 mmol/L 26   Calcium  8.9 - 10.3 mg/dL 9.0  Other pertinent labs:  RPR and HIV non reactive, HSV & GC/chlamydia pending.   Imaging/Diagnostic Tests: None   Manon Jester, DO 02/17/2024, 7:18 AM  PGY-1, [facility] FPTS Intern pager: [phone], text pages welcome Secure chat group [facility] Teaching Service

## 2024-02-17 NOTE — Progress Notes (Signed)
 Mobility Specialist Progress Note;   02/17/24 1045  Mobility  Activity Ambulated with assistance in hallway;Ambulated with assistance to bathroom;Transferred from chair to bed  Level of Assistance Contact guard assist, steadying assist  Assistive Device Front wheel walker  Distance Ambulated (ft) 200 ft  Activity Response Tolerated well  Mobility Referral Yes  Mobility visit 1 Mobility  Mobility Specialist Start Time (ACUTE ONLY) 1045  Mobility Specialist Stop Time (ACUTE ONLY) 1107  Mobility Specialist Time Calculation (min) (ACUTE ONLY) 22 min   Pt in chair upon arrival, agreeable to mobility. Required MinG assistance during ambulation, Ambulated on RA, SPO2 93%>. Requested to use BR at [facility], BM successful. Transferred pt back to bed at [facility]. Left with all needs met, alarm on. Visitor present.   Lauraine Erm Mobility Specialist Please contact via SecureChat or [employer] [phone]

## 2024-02-17 NOTE — Progress Notes (Signed)
 Pharmacy Antibiotic Note  Cody [name] is a 63 y.o. male for which pharmacy has been consulted for vancomycin  dosing for sepsis. Patient presenting with burning genital rash for 3 days.  Afeb, WBC wnl SCr down to 0.97 - resolving AKI   Plan: Ceftriaxone  per MD Adjust Vancomycin  to 1000 mg IV every 12 hours (estAUC 497.1, SCr 0.97, Vd 0.5) Monitor WBC, fever, renal function, cultures, vanc levels prn De-escalate when able Levels at steady state  Height: 5' 9 (175.3 cm) Weight: 117.9 kg (260 lb) IBW/kg (Calculated) : 70.7  Temp (24hrs), Avg:98.3 F (36.8 C), Min:98 F (36.7 C), Max:98.4 F (36.9 C)  Recent Labs  Lab 02/15/24 1001 02/15/24 1027 02/15/24 1200 02/16/24 0423 02/17/24 0500  WBC 8.1  --   --  6.8 5.7  CREATININE 1.73*  --   --  0.99 0.97  LATICACIDVEN  --  3.7* 2.9*  --   --     Estimated Creatinine Clearance: 97.5 mL/min (by C-G formula based on SCr of 0.97 mg/dL).    No Known Allergies  Microbiology results: 7/5 BCx: ng < 24 hr 7/5 HSV: ip 7/5 GC/chlamydia: ip 7/5 HIV Screen: non-reactive 7/5 RPR: non-reactive 7/5 COV/FLU/RSV: negative  Thank you for allowing pharmacy to be a part of this patient's care.   Cody [name], PharmD 02/17/2024 9:00 AM  **Pharmacist phone directory can be found on [email or web address] listed under [facility]**

## 2024-02-17 NOTE — Plan of Care (Signed)

## 2024-02-17 NOTE — Assessment & Plan Note (Addendum)
 Sepsis resolved and VSS.  Awaiting Ucx and following BCx. No dysuria or hesitancy - Continue empiric treatment with ceftriaxone  pending sensitivities, d/c vancomycin  - Continuous cardiac monitoring & pulse oximetry - BCx NGTD @ 2 day - Sample for urine culture initially lost, pending results starting 7/7.  - Wound culture, ordered, but not drawn and now pretreated - AM CBC, BMP

## 2024-02-17 NOTE — Assessment & Plan Note (Signed)
 Notable history for cocaine and tobacco abuse. - TOC consult - 14 mg nicotine  patch

## 2024-02-17 NOTE — Assessment & Plan Note (Addendum)
 Paraphimosis that was reduced in the ED. Urology previously consulted in EDP, lotrimin  cream sole recommendation.  Recent oral intercourse with a new partner.  Genital rash.  Awaiting STI labs.  Penile pain significantly improved today - HSV, GC/chlamydia pending on 7/7, RPR negative - Lotrimin  1% twice daily - Acetaminophen  Q6h PRN for pain

## 2024-02-18 ENCOUNTER — Other Ambulatory Visit (HOSPITAL_COMMUNITY): Payer: Self-pay

## 2024-02-18 DIAGNOSIS — N481 Balanitis: Secondary | ICD-10-CM | POA: Diagnosis not present

## 2024-02-18 LAB — GLUCOSE, CAPILLARY
Glucose-Capillary: 113 mg/dL — ABNORMAL HIGH (ref 70–99)
Glucose-Capillary: 132 mg/dL — ABNORMAL HIGH (ref 70–99)

## 2024-02-18 MED ORDER — CEPHALEXIN 500 MG PO CAPS
500.0000 mg | ORAL_CAPSULE | Freq: Four times a day (QID) | ORAL | 0 refills | Status: AC
  Filled 2024-02-18: qty 12, 3d supply, fill #0

## 2024-02-18 MED ORDER — METFORMIN HCL 500 MG PO TABS
500.0000 mg | ORAL_TABLET | Freq: Two times a day (BID) | ORAL | 0 refills | Status: AC
  Filled 2024-02-18: qty 60, 30d supply, fill #0

## 2024-02-18 MED ORDER — NICOTINE 14 MG/24HR TD PT24
14.0000 mg | MEDICATED_PATCH | Freq: Every day | TRANSDERMAL | 0 refills | Status: AC
  Filled 2024-02-18: qty 28, 28d supply, fill #0

## 2024-02-18 MED ORDER — ATORVASTATIN CALCIUM 20 MG PO TABS
20.0000 mg | ORAL_TABLET | Freq: Every day | ORAL | 0 refills | Status: AC
Start: 2024-02-19 — End: ?
  Filled 2024-02-18: qty 30, 30d supply, fill #0

## 2024-02-18 MED ORDER — CLOTRIMAZOLE 1 % EX CREA
TOPICAL_CREAM | Freq: Two times a day (BID) | CUTANEOUS | 0 refills | Status: AC
  Filled 2024-02-18: qty 28, 3d supply, fill #0

## 2024-02-18 NOTE — Progress Notes (Signed)
   02/18/24 1223  TOC Brief Assessment  Insurance and Status Reviewed  Patient has primary care physician Yes  Home environment has been reviewed home alone  Prior level of function: self  Prior/Current Home Services No current home services  Social Drivers of Health Review SDOH reviewed no interventions necessary  Readmission risk has been reviewed Yes  Transition of care needs no transition of care needs at this time    Pt stable for transition home today, no HH or DME needs noted. Resources for substance abuse have been added to AVS per CSW.

## 2024-02-18 NOTE — Assessment & Plan Note (Addendum)
 Unclear what dose of insulin  patient is taking as outpatient.  A1C 5.6 this admission and LDL 120.  Will need to start statin. - 5 units LAI, will aim to discharge solely on oral meds - initiate on metformin  500 BID outpatient - Sensitive SSI - CBGs ACHS - Initiate atorvastatin  20 mg daily

## 2024-02-18 NOTE — Plan of Care (Signed)

## 2024-02-18 NOTE — Assessment & Plan Note (Addendum)
 Sepsis resolved and VSS.  Awaiting Ucx and following BCx. No dysuria or hesitancy, endorses mild LLQ.  - Transition to oral keflex  through 7/11 for a total of 7 days. - Continuous cardiac monitoring & pulse oximetry - BCx NGTD @ 3 day - Ucx pending, starting 7/7.  - Wound culture, ordered, but not drawn and now pretreated - AM CBC, BMP

## 2024-02-18 NOTE — Discharge Summary (Cosign Needed Addendum)
 Family Medicine Teaching Cody [name] Discharge Summary  Patient name: Cody [name] Medical record number: [id number] Date of birth: [birth year] Age: 63 y.o. Gender: male Date of Admission: 02/15/2024  Date of Discharge: 7/8 Admitting Physician: Cody KANDICE Lee, DO  Primary Care Provider: Vicci Barnie NOVAK, MD Consultants: None  Indication for Hospitalization: Sepsis  Discharge Diagnoses/Problem List:  Principal Problem for Admission: Sepsis 2/2 presumed UTI Other Problems addressed during stay:  Principal Problem:   Urinary tract infection Active Problems:   Balanitis   Type 2 diabetes mellitus without complications (HCC)   Polysubstance abuse (HCC)   Penile lesion   Penile pain  Brief Hospital Course:  Cody [name] is a 63 y.o. year old with a history of T2DM, GERD, gout who presented with burning genital rash and was admitted to the [facility] Medicine Teaching Service for balanoposthitis and sepsis 2/2 UTI.  Sepsis  UTI Elevated lactic acid and UA indicated UTI on admission.  Sepsis iso UTI suspected.  UCx pending at time of discharge; BCx showed NGTD at 3 days. Empirically treated with vancomycin  (7/5-7/7) and ceftriaxone  (7/5-7/8), transitioned to cephalexin  500 mg QID (7/8-7/11) for total 7 day antibiotics course.  Balanoposthitis Patient with new painful genital rash (hypopigmented friable lesions over erythematous base on glans) after receiving oral sex from new partner outpatient.  Had paraphimosis that was reduced in the ED; EDP consulted Urology who recommended Lotrimin  cream 1% BID without need for emergent urology intervention.  STI labs with RPR negative, HIV negative, HSV negative, GC/chlamydia negative.  Unclear etiology of rash, but possible fungal infection versus untested STI.  Lotrimin  continued at discharge through 7/11 for total 7 day course.  Pain resolved and rash improving at time of discharge.  T2DM A1c 11.7 ~1 year ago.  Unclear insulin  regimen  outpatient, prescribed 10 units but reported taking 3 units.  Treated with Semglee  5u inpatient; A1c resulted at 5.6 and patient was discharged on metformin  500 mg BID.  Other chronic conditions were medically managed with home medications and formulary alternatives as necessary (polysubstance abuse (cocaine, tobacco)).  PCP Follow-up Recommendations: Follow up for resolution of rash, consider extending Lotrimin  duration if needed. Follow up response to metformin . 2.   Suspect intermittent oxygen requirement is due to probable OSA and/or COPD given smoking history and apneic episode overnight.  Recommend outpatient follow up with PFTs and/or sleep study. 3.   Recommend CBC in 1 week.  Results/Tests Pending at Time of Discharge:  Unresulted Labs (From admission, onward)     Start     Ordered   02/15/24 1807  Aerobic Culture w Gram Stain (superficial specimen)  Once,   R        02/15/24 1808           Disposition: Home w/ [facility]  Discharge Condition: Stable vitals, resolved pain, bleeding and discharge. Finishing antibiotics 7/11.   Discharge Exam:  Vitals:   02/18/24 0746 02/18/24 1126  BP: 122/79 130/79  Pulse: 67 85  Resp: 17 18  Temp: 98.9 F (37.2 C) 98.2 F (36.8 C)  SpO2: 96% 95%   General: NAD, awake, alert and communicating clearly. Cardiovascular: RRR, no m/r/g. Respiratory: CTAB, normal WOB on room air. Abdomen: Soft abdomen, nontender to palpation. Normoactive bowel sounds. GU: Minimally edematous glans with hypopigmented lesions over moderately erythematous base, no active bleeding or discharge. Non tender shaft of penis and scrotum.  Significant Procedures: None  Significant Labs and Imaging:  Recent Labs  Lab 02/17/24 0500  WBC 5.7  HGB 11.1*  HCT 34.0*  PLT 247   Recent Labs  Lab 02/17/24 0500  NA 137  K 3.7  CL 102  CO2 26  GLUCOSE 127*  BUN 13  CREATININE 0.97  CALCIUM  9.0   CXR: No acute cardiopulmonary process  Discharge Medications:   Allergies as of 02/18/2024   No Known Allergies      Medication List     PAUSE taking these medications    ergocalciferol 1.25 MG (50000 UT) capsule Wait to take this until your doctor or other care provider tells you to start again. Commonly known as: VITAMIN D2 Take 50,000 Units by mouth once a week.   losartan 25 MG tablet Wait to take this until your doctor or other care provider tells you to start again. Commonly known as: COZAAR Take 12.5 mg by mouth every morning.       STOP taking these medications    insulin  glargine 100 UNIT/ML Solostar Pen Commonly known as: LANTUS    rosuvastatin 20 MG tablet Commonly known as: CRESTOR       TAKE these medications    acetaminophen  325 MG tablet Commonly known as: TYLENOL  Take 2 tablets (650 mg total) by mouth every 6 (six) hours as needed for mild pain or fever.   atorvastatin  20 MG tablet Commonly known as: LIPITOR Take 1 tablet (20 mg total) by mouth daily. Start taking on: February 19, 2024   cephALEXin  500 MG capsule Commonly known as: KEFLEX  Take 1 capsule (500 mg total) by mouth 4 (four) times daily until gone.   clotrimazole  1 % cream Commonly known as: LOTRIMIN  Apply topically 2 (two) times daily for 3 days.   metFORMIN  500 MG tablet Commonly known as: GLUCOPHAGE  Take 1 tablet (500 mg total) by mouth 2 (two) times daily with a meal.   nicotine  14 mg/24hr patch Commonly known as: NICODERM CQ  - dosed in mg/24 hours Place 1 patch (14 mg total) onto the skin daily. Start taking on: February 19, 2024       Discharge Instructions: Please refer to Patient Instructions section of EMR for full details.  Patient was counseled important signs and symptoms that should prompt return to medical care, changes in medications, dietary instructions, activity restrictions, and follow up appointments.   Follow-Up Appointments:  Follow-up Information     Cody Barnie NOVAK, MD. Schedule an appointment as soon as possible for  a visit in 1 week(s).   Specialty: Internal Medicine Contact information: [address] [place] [address] [phone]                Manon Jester, DO 02/18/2024, 4:40 PM PGY-1, [facility]   FMTS Upper-Level Resident Attestation I have independently interviewed and examined the patient. I have discussed the above with Dr. Manon and agree with the documented plan. My edits for correction/addition/clarification are included above. Please see any attending notes.  Jj Enyeart Toma, MD PGY-2, [facility] 02/18/2024 9:01 PM FPTS Service pager: [phone] (text pages welcome through AMION)

## 2024-02-18 NOTE — Assessment & Plan Note (Addendum)
 Paraphimosis that was reduced in the ED. Urology previously consulted in EDP, lotrimin  cream sole recommendation.  Recent oral intercourse with a new partner.  Genital rash.  Awaiting STI labs.  Penile pain resolved 7/8 - GC/chlamydia, RPR and HSV negative - Lotrimin  1% twice daily - Acetaminophen  Q6h PRN for pain

## 2024-02-18 NOTE — Progress Notes (Signed)
     Daily Progress Note Intern Pager: [phone]  Patient name: Cody [name] Medical record number: [id number] Date of birth: [birth year] Age: 63 y.o. Gender: male  Primary Care Provider: Vicci Barnie NOVAK, MD Consultants: None Code Status: Full  Pt Overview and Major Events to Date:  7/5 - admitted, started Vanc and ceftriaxone   Medical Decision Making:  63 yo m presented in sepsis with burning genital rash and balanoposthitis hx of recent receptive oral sex, T2DM, polysubstance use. Met sepsis criteria on admission, sepsis now resolved. Assessment & Plan Urinary tract infection Sepsis resolved and VSS.  Awaiting Ucx and following BCx. No dysuria or hesitancy, endorses mild LLQ.  - Transition to oral keflex  through 7/11 for a total of 7 days. - Continuous cardiac monitoring & pulse oximetry - BCx NGTD @ 3 day - Ucx pending, starting 7/7.  - Wound culture, ordered, but not drawn and now pretreated - AM CBC, BMP Balanitis Paraphimosis that was reduced in the ED. Urology previously consulted in EDP, lotrimin  cream sole recommendation.  Recent oral intercourse with a new partner.  Genital rash.  Awaiting STI labs.  Penile pain resolved 7/8 - GC/chlamydia, RPR and HSV negative - Lotrimin  1% twice daily - Acetaminophen  Q6h PRN for pain Type 2 diabetes mellitus without complications (HCC) Unclear what dose of insulin  patient is taking as outpatient.  A1C 5.6 this admission and LDL 120.  Will need to start statin. - 5 units LAI, will aim to discharge solely on oral meds - initiate on metformin  500 BID outpatient - Sensitive SSI - CBGs ACHS - Initiate atorvastatin  20 mg daily Polysubstance abuse (HCC) Notable history for cocaine and tobacco abuse. - TOC consult - 14 mg nicotine  patch   FEN/GI: Carb modified PPx: Lovenox  Dispo:Home with home health today.   Subjective:  NAEON, patient is doing well and communicative. He denies pain, bleeding or discharge.    Objective: Temp:  [97.8 F (36.6 C)-98.9 F (37.2 C)] 98.9 F (37.2 C) (07/08 0746) Pulse Rate:  [67-94] 67 (07/08 0746) Resp:  [17-18] 17 (07/08 0746) BP: (114-136)/(69-86) 122/79 (07/08 0746) SpO2:  [93 %-100 %] 96 % (07/08 0746) Physical Exam: General: NAD, awake, alert and communicating clearly Cardiovascular: RRR, no m/r/g Respiratory: CTA b/l, normal wob on room air Abdomen: Soft abdomen with mild LLQ pain GU: Minimally edematous glans with minimal erythema, few hypopigmented lesions with small amount of dried blood and no bleeding or discharge. Non tender penis and scrotum.   Laboratory: Most recent CBC Lab Results  Component Value Date   WBC 5.7 02/17/2024   HGB 11.1 (L) 02/17/2024   HCT 34.0 (L) 02/17/2024   MCV 98.3 02/17/2024   PLT 247 02/17/2024   Most recent BMP    Latest Ref Rng & Units 02/17/2024    5:00 AM  BMP  Glucose 70 - 99 mg/dL 872   BUN 8 - 23 mg/dL 13   Creatinine 9.38 - 1.24 mg/dL 9.02   Sodium 864 - 854 mmol/L 137   Potassium 3.5 - 5.1 mmol/L 3.7   Chloride 98 - 111 mmol/L 102   CO2 22 - 32 mmol/L 26   Calcium  8.9 - 10.3 mg/dL 9.0     Manon Jester, DO 02/18/2024, 10:14 AM  PGY-1, [facility] FPTS Intern pager: [phone], text pages welcome Secure chat group [facility] Teaching Service

## 2024-02-18 NOTE — Assessment & Plan Note (Signed)
 Notable history for cocaine and tobacco abuse. - TOC consult - 14 mg nicotine  patch

## 2024-02-18 NOTE — Progress Notes (Signed)
 Physical Therapy Treatment Patient Details Name: Cody [name] MRN: [id number] DOB: Jul 20, 1961 Today's Date: 02/18/2024   History of Present Illness Cody [name] is a 63 y.o. male admitted 02/15/24 for sepsis 2/2 to UTI along with balanoposthitis. PMHx: T2DM, polysubstance use, GERD, and OA.    PT Comments  Pt received in chair, agreeable to therapy session and with good participation and improved tolerance for transfer and gait training. Pt performed household distance gait trial with rollator and up to CGA for safety (mostly with higher level balance tasks such as backward stepping and turning/pivoting unsupported. Pt SpO2 WFL on RA this date and HR to 114 bpm with exertion. Pt agreeable to sit up in recliner to await RN to review discharge paperwork at end of session. Pt continues to benefit from PT services to progress toward functional mobility goals.     If plan is discharge home, recommend the following: A little help with walking and/or transfers;A little help with bathing/dressing/bathroom;Assistance with cooking/housework   Can travel by private vehicle        Equipment Recommendations  None recommended by PT    Recommendations for Other Services       Precautions / Restrictions Precautions Precautions: Fall Recall of Precautions/Restrictions: Intact Precaution/Restrictions Comments: watch SpO2 Restrictions Weight Bearing Restrictions Per Provider Order: No     Mobility  Bed Mobility Overal bed mobility: Needs Assistance             General bed mobility comments: pt received up in chair    Transfers Overall transfer level: Needs assistance Equipment used: Rollator (4 wheels) Transfers: Sit to/from Stand Sit to Stand: Supervision           General transfer comment: from chair>rollator and rollator<>low toilet height with use of x2 wall rails for support when lowering/lifting. HHA CGA to pivot from locked rollator ~75ft to chair in narrow area at bedside.     Ambulation/Gait Ambulation/Gait assistance: Contact guard assist, Supervision Gait Distance (Feet): 125 Feet Assistive device: Rollator (4 wheels) Gait Pattern/deviations: Decreased step length - right, Decreased step length - left, Decreased stride length, Decreased dorsiflexion - right, Decreased dorsiflexion - left, Trunk flexed, Step-to pattern, Step-through pattern, Narrow base of support Gait velocity: reduced     General Gait Details: no LOB. Initially CGA for safety/stability, especially with backward stepping and turns, Supervision for forward stepping. min cues for improved step length and upright posture/closer proximity to 4WW.   Stairs Stairs:  (pt reports he has level entry)           Wheelchair Mobility     Tilt Bed    Modified Rankin (Stroke Patients Only)       Balance Overall balance assessment: Mild deficits observed, not formally tested                                          Communication Communication Communication: Impaired Factors Affecting Communication: Reduced clarity of speech;Other (comment) (increased time for processing)  Cognition Arousal: Alert Behavior During Therapy: Flat affect   PT - Cognitive impairments: No family/caregiver present to determine baseline                       PT - Cognition Comments: Pt A&O x4 Following commands: Intact      Cueing Cueing Techniques: Verbal cues, Gestural cues  Exercises  General Comments General comments (skin integrity, edema, etc.): SpO2 94% and above on RA, HR to 114 bpm with exertion, improves with seated/standing rest breaks.      Pertinent Vitals/Pain Pain Assessment Pain Assessment: No/denies pain    Home Living                          Prior Function            PT Goals (current goals can now be found in the care plan section) Acute Rehab PT Goals Patient Stated Goal: Return Home PT Goal Formulation: With patient Time For  Goal Achievement: 03/01/24 Progress towards PT goals: Progressing toward goals    Frequency    Min 2X/week      PT Plan      Co-evaluation              AM-PAC PT 6 Clicks Mobility   Outcome Measure  Help needed turning from your back to your side while in a flat bed without using bedrails?: None Help needed moving from lying on your back to sitting on the side of a flat bed without using bedrails?: A Little Help needed moving to and from a bed to a chair (including a wheelchair)?: A Little Help needed standing up from a chair using your arms (e.g., wheelchair or bedside chair)?: A Little Help needed to walk in hospital room?: A Little Help needed climbing 3-5 steps with a railing? : A Lot 6 Click Score: 18    End of Session Equipment Utilized During Treatment: Gait belt Activity Tolerance: Patient tolerated treatment well Patient left: in chair;with call bell/phone within reach Nurse Communication: Mobility status PT Visit Diagnosis: Other abnormalities of gait and mobility (R26.89)     Time: 8798-8774 PT Time Calculation (min) (ACUTE ONLY): 24 min  Charges:    $Gait Training: 8-22 mins $Therapeutic Activity: 8-22 mins PT General Charges $$ ACUTE PT VISIT: 1 Visit                     Moises Terpstra P., PTA Acute Rehabilitation Services Secure Chat Preferred 9a-5:30pm Office: [phone]    Connell HERO [facility] 02/18/2024, 12:38 PM

## 2024-02-19 ENCOUNTER — Telehealth: Payer: Self-pay | Admitting: *Deleted

## 2024-02-19 NOTE — Transitions of Care (Post Inpatient/ED Visit) (Signed)
   02/19/2024  Name: ALECXANDER MAINWARING MRN: [id number] DOB: [birth year]  Today's TOC FU Call Status: Today's TOC FU Call Status:: Unsuccessful Call (1st Attempt) Unsuccessful Call (1st Attempt) Date: 02/19/24  Attempted to reach the patient regarding the most recent Inpatient/ED visit.  Follow Up Plan: Additional outreach attempts will be made to reach the patient to complete the Transitions of Care (Post Inpatient/ED visit) call.   Andrea Dimes RN, BSN [place]  [facility] RN Care Manager [phone]'

## 2024-02-20 ENCOUNTER — Telehealth: Payer: Self-pay | Admitting: *Deleted

## 2024-02-20 ENCOUNTER — Ambulatory Visit: Payer: Self-pay | Admitting: Family Medicine

## 2024-02-20 LAB — URINE CULTURE: Culture: 70000 — AB

## 2024-02-20 LAB — CULTURE, BLOOD (ROUTINE X 2)
Culture: NO GROWTH
Culture: NO GROWTH
Special Requests: ADEQUATE
Special Requests: ADEQUATE

## 2024-02-20 MED ORDER — CIPROFLOXACIN HCL 500 MG PO TABS
500.0000 mg | ORAL_TABLET | Freq: Two times a day (BID) | ORAL | 0 refills | Status: AC
Start: 2024-02-20 — End: 2024-03-01

## 2024-02-20 NOTE — Transitions of Care (Post Inpatient/ED Visit) (Signed)
   02/20/2024  Name: Cody [name] MRN: [id number] DOB: 1961/06/26  Today's TOC FU Call Status: Today's TOC FU Call Status:: Unsuccessful Call (2nd Attempt) Unsuccessful Call (2nd Attempt) Date: 02/20/24  Attempted to reach the patient regarding the most recent Inpatient/ED visit.  Follow Up Plan: Additional outreach attempts will be made to reach the patient to complete the Transitions of Care (Post Inpatient/ED visit) call.   Andrea Dimes RN, BSN [place]  [facility] RN Care Manager [phone]

## 2024-02-20 NOTE — Progress Notes (Signed)
 Skin flora and likely contaminant in urine. Also < 100000 CFU. Of note, he was already treated with Vanc and Cephalosporin in the hospital. Outpatient PCP as planned is recommended. F/U ED as needed

## 2024-02-21 ENCOUNTER — Telehealth: Payer: Self-pay

## 2024-02-21 NOTE — Transitions of Care (Post Inpatient/ED Visit) (Signed)
   02/21/2024  Name: MACK ALVIDREZ MRN: [id number] DOB: 08-27-1960  Today's TOC FU Call Status: Today's TOC FU Call Status:: Unsuccessful Call (3rd Attempt) Unsuccessful Call (3rd Attempt) Date: 02/21/24  Attempted to reach the patient regarding the most recent Inpatient/ED visit.  Follow Up Plan: No further outreach attempts will be made at this time. We have been unable to contact the patient.  Shona Prow RN, CCM [place]  [facility] RN Care Manager [phone]
